# Patient Record
Sex: Male | Born: 1955 | Hispanic: No | Marital: Single | Smoking: Never smoker
Health system: Southern US, Community
[De-identification: ages and names within clinical notes are randomized; demographics above are authoritative.]

## PROBLEM LIST (undated history)

## (undated) DIAGNOSIS — E538 Deficiency of other specified B group vitamins: Secondary | ICD-10-CM

## (undated) DIAGNOSIS — N281 Cyst of kidney, acquired: Secondary | ICD-10-CM

## (undated) DIAGNOSIS — M7020 Olecranon bursitis, unspecified elbow: Secondary | ICD-10-CM

## (undated) DIAGNOSIS — K5900 Constipation, unspecified: Secondary | ICD-10-CM

## (undated) DIAGNOSIS — S14105A Unspecified injury at C5 level of cervical spinal cord, initial encounter: Secondary | ICD-10-CM

## (undated) DIAGNOSIS — L89159 Pressure ulcer of sacral region, unspecified stage: Secondary | ICD-10-CM

## (undated) DIAGNOSIS — N319 Neuromuscular dysfunction of bladder, unspecified: Secondary | ICD-10-CM

## (undated) DIAGNOSIS — I959 Hypotension, unspecified: Secondary | ICD-10-CM

## (undated) DIAGNOSIS — Z993 Dependence on wheelchair: Secondary | ICD-10-CM

## (undated) DIAGNOSIS — N210 Calculus in bladder: Secondary | ICD-10-CM

## (undated) DIAGNOSIS — E559 Vitamin D deficiency, unspecified: Secondary | ICD-10-CM

## (undated) DIAGNOSIS — R972 Elevated prostate specific antigen [PSA]: Secondary | ICD-10-CM

## (undated) DIAGNOSIS — I480 Paroxysmal atrial fibrillation: Secondary | ICD-10-CM

## (undated) DIAGNOSIS — I700 Atherosclerosis of aorta: Secondary | ICD-10-CM

## (undated) DIAGNOSIS — Z933 Colostomy status: Secondary | ICD-10-CM

## (undated) DIAGNOSIS — G8254 Quadriplegia, C5-C7 incomplete: Secondary | ICD-10-CM

## (undated) DIAGNOSIS — E785 Hyperlipidemia, unspecified: Secondary | ICD-10-CM

## (undated) DIAGNOSIS — D649 Anemia, unspecified: Secondary | ICD-10-CM

## (undated) HISTORY — DX: Colostomy status: Z93.3

---
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
## 2012-09-17 DIAGNOSIS — M719 Bursopathy, unspecified: Secondary | ICD-10-CM | POA: Insufficient documentation

## 2013-05-05 ENCOUNTER — Emergency Department: Payer: Self-pay | Admitting: Emergency Medicine

## 2013-05-05 LAB — TROPONIN I: Troponin-I: <0.02 ng/mL

## 2013-05-05 LAB — URINALYSIS, COMPLETE
Bilirubin,UR: NEGATIVE
Glucose,UR: NEGATIVE mg/dL (ref 0–75)
Hyaline Cast: 10
Leukocyte Esterase: NEGATIVE
WBC UR: 4 /HPF (ref 0–5)

## 2013-05-05 LAB — COMPREHENSIVE METABOLIC PANEL
Albumin: 4.1 g/dL (ref 3.4–5.0)
Alkaline Phosphatase: 105 U/L (ref 50–136)
Anion Gap: 7 (ref 7–16)
Calcium, Total: 8.8 mg/dL (ref 8.5–10.1)
Co2: 29 mmol/L (ref 21–32)
Creatinine: 0.71 mg/dL (ref 0.60–1.30)
EGFR (Non-African Amer.): >60
Potassium: 3.2 mmol/L — ABNORMAL LOW (ref 3.5–5.1)
Total Protein: 7.3 g/dL (ref 6.4–8.2)

## 2013-05-05 LAB — CBC WITH DIFFERENTIAL/PLATELET
Eosinophil %: 3.4 %
Lymphocyte %: 12.7 %
MCH: 29 pg (ref 26.0–34.0)
MCV: 87 fL (ref 80–100)
Monocyte #: 0.6 x10 3/mm (ref 0.2–1.0)
Monocyte %: 9.2 %
Neutrophil #: 4.5 10*3/uL (ref 1.4–6.5)
Neutrophil %: 73.6 %
RBC: 4.34 10*6/uL — ABNORMAL LOW (ref 4.40–5.90)
RDW: 13.4 % (ref 11.5–14.5)
WBC: 6.2 10*3/uL (ref 3.8–10.6)

## 2013-05-07 LAB — URINE CULTURE

## 2013-06-08 DIAGNOSIS — R6510 Systemic inflammatory response syndrome (SIRS) of non-infectious origin without acute organ dysfunction: Secondary | ICD-10-CM

## 2013-06-08 HISTORY — DX: Systemic inflammatory response syndrome (sirs) of non-infectious origin without acute organ dysfunction: R65.10

## 2013-06-09 DIAGNOSIS — K5909 Other constipation: Secondary | ICD-10-CM | POA: Insufficient documentation

## 2013-06-09 DIAGNOSIS — R6510 Systemic inflammatory response syndrome (SIRS) of non-infectious origin without acute organ dysfunction: Secondary | ICD-10-CM | POA: Insufficient documentation

## 2013-06-09 DIAGNOSIS — L89159 Pressure ulcer of sacral region, unspecified stage: Secondary | ICD-10-CM | POA: Insufficient documentation

## 2013-06-09 DIAGNOSIS — I959 Hypotension, unspecified: Secondary | ICD-10-CM | POA: Insufficient documentation

## 2013-11-30 ENCOUNTER — Encounter: Payer: Self-pay | Admitting: Pediatrics

## 2013-12-23 ENCOUNTER — Encounter: Payer: Self-pay | Admitting: Pediatrics

## 2019-07-01 ENCOUNTER — Ambulatory Visit: Payer: Self-pay | Admitting: Urology

## 2019-07-03 ENCOUNTER — Ambulatory Visit: Payer: Self-pay | Admitting: Urology

## 2019-07-03 ENCOUNTER — Other Ambulatory Visit: Payer: Self-pay

## 2019-07-03 ENCOUNTER — Ambulatory Visit (INDEPENDENT_AMBULATORY_CARE_PROVIDER_SITE_OTHER): Payer: Medicare (Managed Care) | Admitting: Urology

## 2019-07-03 VITALS — BP 80/45 | HR 41 | Ht 76.0 in

## 2019-07-03 DIAGNOSIS — N319 Neuromuscular dysfunction of bladder, unspecified: Secondary | ICD-10-CM | POA: Diagnosis not present

## 2019-07-03 DIAGNOSIS — R310 Gross hematuria: Secondary | ICD-10-CM

## 2019-07-03 DIAGNOSIS — G8254 Quadriplegia, C5-C7 incomplete: Secondary | ICD-10-CM

## 2019-07-03 LAB — BLADDER SCAN AMB NON-IMAGING: Scan Result: 343

## 2019-07-03 NOTE — Progress Notes (Signed)
07/03/2019 3:56 PM   [name] 02/02/1956 [id number]  Referring provider: Marnee Guarneri, MD [address] [place],  [place] [address]  Chief Complaint  Patient presents with  . Urinary Retention    HPI: [name] is a 64 y.o. 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  He states he may have seen a urologist within a few years after his injury because he had 2 infections.  He denies chronic urinary tract infections.  He voids volitionally and has rare episodes of urinary incontinence.  Denies recurrent urinary tract symptoms.  He was seen by his PCP for an episode of hypotension and dizziness November 2020 and had noted some increased urinary frequency.  Urinalysis was unremarkable.  A PVR by bladder scan was >500 cc.  He subsequently voided 300 cc and a follow-up bladder scan was >250 cc.  He refused a Foley catheter at that time.  His urine culture had insignificant growth.  He states he voided prior to coming to the office today and did not think he would be able to give a sample.  Estimated bladder volume by bladder scan was >300 mL.   PMH: No past medical history on file.  Surgical History: Colostomy  Home Medications:  Allergies as of 07/03/2019   Not on File     Medication List    as of July 03, 2019  3:56 PM   You have not been prescribed any medications.     Allergies: Not on File  Family History: No family history on file.  Social History:  has no history on file for tobacco, alcohol, and drug.  ROS: UROLOGY Frequent Urination?: No Hard to postpone urination?: No Burning/pain with urination?: No Get up at night to urinate?: No Leakage of urine?: No Urine stream starts and stops?: Yes Trouble starting stream?: No Do you have to strain to urinate?: No Blood in urine?: No Urinary tract infection?: No Sexually transmitted disease?: No Injury to kidneys or bladder?: No Painful intercourse?:  No Weak stream?: No Erection problems?: No Penile pain?: No  Gastrointestinal Nausea?: No Vomiting?: No Indigestion/heartburn?: No Diarrhea?: No Constipation?: No  Constitutional Fever: No Night sweats?: No Weight loss?: No Fatigue?: No  Skin Skin rash/lesions?: No Itching?: No  Eyes Blurred vision?: No Double vision?: No  Ears/Nose/Throat Sore throat?: No Sinus problems?: No  Hematologic/Lymphatic Swollen glands?: No Easy bruising?: No  Cardiovascular Leg swelling?: No Chest pain?: No  Respiratory Cough?: No Shortness of breath?: No  Endocrine Excessive thirst?: No  Musculoskeletal Back pain?: No Joint pain?: No  Neurological Headaches?: No Dizziness?: No  Psychologic Depression?: No Anxiety?: No  Physical Exam: BP (!) 80/45 (BP Location: Left Arm, Patient Position: Sitting, Cuff Size: Large)   Pulse (!) 41   Ht 6\' 4"  (1.93 m)   Constitutional:  Alert and oriented, No acute distress. HEENT: [place] AT, moist mucus membranes.  Trachea midline, no masses. Cardiovascular: No clubbing, cyanosis, or edema. Respiratory: Normal respiratory effort, no increased work of breathing. GI: Abdomen is soft, nontender, nondistended, no abdominal masses GU: No CVA tenderness Skin: No rashes, bruises or suspicious lesions. Psychiatric: Normal mood and affect.   Assessment & Plan:   64 y.o. male with C7 spinal cord injury.  I suspect his mild to moderate residuals are chronic.  Recommend scheduling a renal ultrasound to assess upper tracts.  Will discuss urodynamic study after renal ultrasound.  Abbie Sons, [place]  [address] [place], [place] [address] [phone]

## 2019-07-06 ENCOUNTER — Telehealth: Payer: Self-pay | Admitting: Urology

## 2019-07-06 ENCOUNTER — Encounter: Payer: Self-pay | Admitting: Urology

## 2019-07-06 DIAGNOSIS — G8254 Quadriplegia, C5-C7 incomplete: Secondary | ICD-10-CM | POA: Insufficient documentation

## 2019-07-06 DIAGNOSIS — N319 Neuromuscular dysfunction of bladder, unspecified: Secondary | ICD-10-CM | POA: Insufficient documentation

## 2019-07-06 NOTE — Telephone Encounter (Signed)
Patient needs a renal ultrasound scheduled.  He is part of the [facility] program at [facility] and I think they have to approve and schedule his appointments.  Contact info is scanned in media.  Thanks

## 2019-07-07 NOTE — Telephone Encounter (Signed)
Thanks, he will not necessarily need office follow-up.  It will depend on the ultrasound results.

## 2019-07-07 NOTE — Telephone Encounter (Signed)
Contacted [facility] and they will get the RUS scheduled and call back to schedule the follow up

## 2019-07-13 ENCOUNTER — Other Ambulatory Visit: Payer: Self-pay | Admitting: Family Medicine

## 2019-07-13 DIAGNOSIS — R339 Retention of urine, unspecified: Secondary | ICD-10-CM

## 2019-07-16 ENCOUNTER — Other Ambulatory Visit: Payer: Self-pay | Admitting: Family Medicine

## 2019-07-16 DIAGNOSIS — R1032 Left lower quadrant pain: Secondary | ICD-10-CM

## 2019-07-17 ENCOUNTER — Ambulatory Visit
Admission: RE | Admit: 2019-07-17 | Discharge: 2019-07-17 | Disposition: A | Discharge status: Home / Self Care | Payer: Medicare (Managed Care) | Source: Ambulatory Visit | Attending: Family Medicine | Admitting: Family Medicine

## 2019-07-17 ENCOUNTER — Other Ambulatory Visit: Payer: Self-pay

## 2019-07-17 DIAGNOSIS — R1032 Left lower quadrant pain: Secondary | ICD-10-CM | POA: Insufficient documentation

## 2019-07-17 DIAGNOSIS — R339 Retention of urine, unspecified: Secondary | ICD-10-CM | POA: Diagnosis not present

## 2020-01-28 ENCOUNTER — Ambulatory Visit: Admit: 2020-01-28 | Payer: Medicare (Managed Care) | Admitting: Internal Medicine

## 2020-01-28 SURGERY — COLONOSCOPY WITH PROPOFOL
Anesthesia: General

## 2020-02-23 ENCOUNTER — Other Ambulatory Visit: Payer: Self-pay

## 2020-02-23 ENCOUNTER — Other Ambulatory Visit: Payer: Self-pay | Admitting: Family Medicine

## 2020-02-23 ENCOUNTER — Ambulatory Visit (INDEPENDENT_AMBULATORY_CARE_PROVIDER_SITE_OTHER): Payer: Medicare (Managed Care) | Admitting: Physician Assistant

## 2020-02-23 VITALS — BP 71/47 | HR 86 | Ht 76.0 in | Wt 160.0 lb

## 2020-02-23 DIAGNOSIS — N319 Neuromuscular dysfunction of bladder, unspecified: Secondary | ICD-10-CM | POA: Diagnosis not present

## 2020-02-23 DIAGNOSIS — G43C0 Periodic headache syndromes in child or adult, not intractable: Secondary | ICD-10-CM

## 2020-02-23 LAB — BLADDER SCAN AMB NON-IMAGING

## 2020-02-23 NOTE — Progress Notes (Signed)
02/23/2020 3:00 PM   [name] [birth year] [id number]  CC: Chief Complaint  Patient presents with  . Urinary Retention    HPI: [name] is a 64 y.o. male with incomplete quadriplegia due to a C7 spinal cord injury who was seen in clinic earlier this year by [name] for evaluation of urinary retention who presents today for the same. He has been noted to have chronically elevated residuals >377mL; renal ultrasound dated 07/17/2019 with no hydronephrosis.  Today he reports being prompted to come to the office by his PCP, who expressed concern about possible urinary retention. Patient reports pain in his head and neck with urination over the past 3-4 days, during which time he has been constipated. He started Senna for management of his constipation and his pain has begun to resolve now that he has started having bowel movements. He believes the pain is secondary to his constipation and would like to defer urologic intervention pending resolution of this.  He does report increased hesitancy and intermittency during this period of constipation which is not typical for him. He wonders if an enlarged prostate may be contributory to poorly emptying his bladder.  PVR 365mL.  PMH: No past medical history on file.  Surgical History: No past surgical history on file.  Home Medications:  Allergies as of 02/23/2020   Not on File     Medication List       Accurate as of February 23, 2020  3:00 PM. If you have any questions, ask your nurse or doctor.        Cholecalciferol 25 MCG (1000 UT) tablet Take by mouth.   diltiazem 60 MG tablet Commonly known as: CARDIZEM Take by mouth.   docusate sodium 100 MG capsule Commonly known as: COLACE Take 100 mg by mouth daily as needed.   gabapentin 300 MG capsule Commonly known as: NEURONTIN Take 300 mg by mouth at bedtime.   midodrine 2.5 MG tablet Commonly known as: PROAMATINE Take 2.5 mg by mouth daily.   potassium  chloride 10 MEQ tablet Commonly known as: KLOR-CON Take by mouth.       Allergies:  Not on File  Family History: No family history on file.  Social History:   has no history on file for tobacco use, alcohol use, and drug use.  Physical Exam: BP (!) 71/47 (BP Location: Left Arm, Patient Position: Sitting, Cuff Size: Normal)   Pulse 86   Ht 6\' 4"  (1.93 m)   Wt 160 lb (72.6 kg)   BMI 19.48 kg/m   Constitutional:  Alert and oriented, no acute distress, nontoxic appearing HEENT: [place], AT Cardiovascular: No clubbing, cyanosis, or edema Respiratory: Normal respiratory effort, no increased work of breathing Skin: No rashes, bruises or suspicious lesions Psychiatric: Normal mood and affect  Laboratory Data: Results for orders placed or performed in visit on 02/23/20  Bladder Scan (Post Void Residual) in office  Result Value Ref Range   Scan Result 347mL    Assessment & Plan:   1. Neurogenic bladder PVR stable compared to prior with renal US earlier this year reassuring for upper tract involvement. No intervention indicated at this time and patient agrees with this. I recommend annual follow-up with renal ultrasound to evaluate him for worsening incomplete emptying and explained that he may require indwelling Foley versus CIC if he develops worsened chronic retention, upper tract involvement, or recurrent UTI.  I suspect his pain, hesitancy, and intermittency may be associated with active constipation. Counseled him  to contact the office for further evaluation of these with IPSS and DRE if they do not resolve with resolution of constipation. He expressed understanding. - Bladder Scan (Post Void Residual) in office  Return in about 1 year (around 02/22/2021) for Incomplete emptying f/u with PVR + RUS prior.  Debroah Loop, PA-C  [facility] [address], [place], [place] [address] [phone]

## 2020-03-10 ENCOUNTER — Ambulatory Visit
Admission: RE | Admit: 2020-03-10 | Discharge: 2020-03-10 | Disposition: A | Discharge status: Home / Self Care | Payer: Medicare (Managed Care) | Source: Ambulatory Visit | Attending: Family Medicine | Admitting: Family Medicine

## 2020-03-10 ENCOUNTER — Other Ambulatory Visit: Payer: Self-pay

## 2020-03-10 DIAGNOSIS — G43C0 Periodic headache syndromes in child or adult, not intractable: Secondary | ICD-10-CM | POA: Insufficient documentation

## 2021-02-21 ENCOUNTER — Other Ambulatory Visit: Payer: Self-pay

## 2021-02-21 DIAGNOSIS — N319 Neuromuscular dysfunction of bladder, unspecified: Secondary | ICD-10-CM

## 2021-02-22 ENCOUNTER — Ambulatory Visit: Payer: Self-pay | Admitting: Physician Assistant

## 2021-03-01 ENCOUNTER — Other Ambulatory Visit: Payer: Self-pay | Admitting: Family Medicine

## 2021-03-01 DIAGNOSIS — N319 Neuromuscular dysfunction of bladder, unspecified: Secondary | ICD-10-CM

## 2021-03-09 ENCOUNTER — Ambulatory Visit: Payer: Medicare (Managed Care)

## 2021-03-23 ENCOUNTER — Ambulatory Visit
Admission: RE | Admit: 2021-03-23 | Discharge: 2021-03-23 | Disposition: A | Discharge status: Home / Self Care | Payer: Medicare (Managed Care) | Source: Ambulatory Visit | Attending: Family Medicine | Admitting: Family Medicine

## 2021-03-23 ENCOUNTER — Other Ambulatory Visit: Payer: Self-pay

## 2021-03-23 DIAGNOSIS — N319 Neuromuscular dysfunction of bladder, unspecified: Secondary | ICD-10-CM | POA: Insufficient documentation

## 2021-05-08 ENCOUNTER — Encounter: Payer: Self-pay | Admitting: *Deleted

## 2021-06-01 ENCOUNTER — Ambulatory Visit: Payer: Medicare (Managed Care) | Admitting: Physician Assistant

## 2021-06-29 ENCOUNTER — Ambulatory Visit: Payer: Medicare (Managed Care) | Admitting: Physician Assistant

## 2021-07-19 NOTE — Progress Notes (Signed)
07/20/2021 10:37 AM   [name] 01/17/1956 [id number]  Referring provider: [facility] [address] [place],  [place] [address]  Chief Complaint  Patient presents with   Urinary Retention   Urological history: 1.  Neurogenic bladder -Secondary to C7 spinal cord injury resulting in incomplete quadriplegia -RUS 03/2021 -no hydronephrosis, stones or masses appreciated -PVR's 323 mL   HPI: [name] is a 66 y.o. male who presents today for a yearly visit.    He has been having a weak urinary stream and post void dribbling urinary leakage.  Patient denies any modifying or aggravating factors.  Patient denies any gross hematuria, dysuria or suprapubic/flank pain.  Patient denies any fevers, chills, nausea or vomiting.    PVR 260 mL     PMH: No past medical history on file.  Surgical History: No past surgical history on file.  Home Medications:  Allergies as of 07/20/2021   Not on File      Medication List        Accurate as of July 20, 2021 10:37 AM. If you have any questions, ask your nurse or doctor.          Cholecalciferol 25 MCG (1000 UT) tablet Take by mouth.   diltiazem 60 MG tablet Commonly known as: CARDIZEM Take by mouth.   docusate sodium 100 MG capsule Commonly known as: COLACE Take 100 mg by mouth daily as needed.   gabapentin 300 MG capsule Commonly known as: NEURONTIN Take 300 mg by mouth at bedtime.   midodrine 2.5 MG tablet Commonly known as: PROAMATINE Take 2.5 mg by mouth daily.   potassium chloride 10 MEQ tablet Commonly known as: KLOR-CON Take by mouth.        Allergies: Not on File  Family History: No family history on file.  Social History:  reports that he has never smoked. He has never used smokeless tobacco. He reports that he does not currently use alcohol. No history on file for drug use.  ROS: Pertinent ROS in HPI  Physical Exam: BP (!) 89/63    Pulse 88    Ht 6\' 4"  (1.93  m)    BMI 19.48 kg/m   Constitutional:  Well nourished. Alert and oriented, No acute distress. HEENT: [place] AT, mask in place.  Trachea midline Cardiovascular: No clubbing, cyanosis, or edema. Respiratory: Normal respiratory effort, no increased work of breathing. Neurologic: Grossly intact, no focal deficits, moving 2 extremities.  In wheel chair. Psychiatric: Normal mood and affect.  Laboratory Data: N/A   I have reviewed the labs.   Pertinent Imaging:  07/20/21 10:12  Scan Result 269   Assessment & Plan:    1. Neurogenic bladder -PVR <300 cc -We discussed that the postvoid dribbling may be due to his neurogenic bladder or BPH and 1 way to help delineate this is to undergo urodynamic studies -I explained to the patient that urodynamics is a study that assesses how the bladder and urethra are performing their job of storing and releasing urine.  Urodynamic tests can help explain symptoms such as: incontinence, frequent urination, urgency, hesitancy, dysuria, urinary retention and/or recurrent UTI's.  To perform the test, special catheter is placed into the urethra, a rectal catheter is placed and electrodes are placed around the perineum.  The bladder is filled with water and the patient will be asked to void, it they can.  There may be a video component involved at some center, where the bladder activity is monitored  by a video  -He is interested in having this procedure, but since he belongs to the [facility] program he will need to discuss it with his physician prior to scheduling -He would also like to start tamsulosin for this dribbling, but again he will speak with his physician at [facility] program to see if he can start this medication though it may not be effective if his symptoms are due to a neurogenic bladder versus BPH and he understands this -He will also need a repeat renal ultrasound in October for surveillance for potential hydronephrosis as he has high residuals, and he will let his  physician know at [facility] program  Return in about 1 year (around 07/20/2022) for RUS and PVR .  These notes generated with voice recognition software. I apologize for typographical errors.  Zara Council, PA-C  [facility] [address]  [place], [place] [address] [phone]

## 2021-07-20 ENCOUNTER — Other Ambulatory Visit: Payer: Self-pay

## 2021-07-20 ENCOUNTER — Encounter: Payer: Self-pay | Admitting: Urology

## 2021-07-20 ENCOUNTER — Ambulatory Visit (INDEPENDENT_AMBULATORY_CARE_PROVIDER_SITE_OTHER): Payer: Medicare (Managed Care) | Admitting: Urology

## 2021-07-20 VITALS — BP 89/63 | HR 88 | Ht 76.0 in

## 2021-07-20 DIAGNOSIS — N319 Neuromuscular dysfunction of bladder, unspecified: Secondary | ICD-10-CM

## 2021-07-20 LAB — BLADDER SCAN AMB NON-IMAGING: Scan Result: 269

## 2021-07-20 NOTE — Patient Instructions (Signed)
Mr. Tortorella was experiencing dribbling at the end of voiding, so we discussed starting tamsulosin 0.4 mg daily to see if we can help with the postvoid dribbling issue.  Although this may be due to his neurogenic bladder and not his prostate.  He also will need another ultrasound in October 2023 as he has high residuals, we need to make sure that the urine does not back up into the kidneys.  We also discussed undergoing urodynamics for further studies on his bladder function and he is interested in pursuing these.

## 2021-07-27 ENCOUNTER — Other Ambulatory Visit: Payer: Self-pay | Admitting: Urology

## 2021-07-27 ENCOUNTER — Telehealth: Payer: Self-pay

## 2021-07-27 DIAGNOSIS — N319 Neuromuscular dysfunction of bladder, unspecified: Secondary | ICD-10-CM

## 2021-07-27 NOTE — Telephone Encounter (Signed)
[name] from [facility] program left a message on triage line regarding [name]. she had some questions regarding UDS and concerns about patient taking flomax due to his low blood pressure. Dr.Mouw cell [phone]

## 2021-08-01 ENCOUNTER — Other Ambulatory Visit: Payer: Self-pay | Admitting: Family Medicine

## 2021-08-01 DIAGNOSIS — R9389 Abnormal findings on diagnostic imaging of other specified body structures: Secondary | ICD-10-CM

## 2021-08-01 DIAGNOSIS — G8254 Quadriplegia, C5-C7 incomplete: Secondary | ICD-10-CM

## 2021-08-01 DIAGNOSIS — N319 Neuromuscular dysfunction of bladder, unspecified: Secondary | ICD-10-CM

## 2021-08-01 DIAGNOSIS — R339 Retention of urine, unspecified: Secondary | ICD-10-CM

## 2021-09-01 ENCOUNTER — Other Ambulatory Visit: Payer: Self-pay | Admitting: Urology

## 2021-09-04 ENCOUNTER — Other Ambulatory Visit: Payer: Self-pay | Admitting: Urology

## 2021-09-12 NOTE — Progress Notes (Incomplete)
09/12/21 ?12:22 PM  ? ?[name] ?1955/11/02 ?[id number] ? ?Referring provider:  ?[facility] ?[address] ?[place],  [place] [address] ?No chief complaint on file. ? ? ?Urological history  ?1.  Neurogenic bladder ?-Secondary to C7 spinal cord injury resulting in incomplete quadriplegia ?-RUS 03/2021 -no hydronephrosis, stones or masses appreciated ?-PVR's *** ? ? ?HPI: ?[name] is a 66 y.o.male who presents today for discussion of UDS results.  ? ?He underwent UDS on 08/30/2021 that revealed he held a max capacity of approx. 450 mls. His 1st sensation was felt as 235 mls. There was positive instability. He has a couple unstable contractions. He felt a strong urge intermittently and leaked severely off these contractions. He did not generate a voluntary contraction and void.  ? ? ?PMH: ?No past medical history on file. ? ?Surgical History: ?No past surgical history on file. ? ?Home Medications:  ?Allergies as of 09/13/2021   ?Not on File ?  ? ?  ?Medication List  ?  ? ?  ? Accurate as of September 12, 2021 12:22 PM. If you have any questions, ask your nurse or doctor.  ?  ?  ? ?  ? ?Cholecalciferol 25 MCG (1000 UT) tablet ?Take by mouth. ?  ?diltiazem 60 MG tablet ?Commonly known as: CARDIZEM ?Take by mouth. ?  ?docusate sodium 100 MG capsule ?Commonly known as: COLACE ?Take 100 mg by mouth daily as needed. ?  ?gabapentin 300 MG capsule ?Commonly known as: NEURONTIN ?Take 300 mg by mouth at bedtime. ?  ?midodrine 2.5 MG tablet ?Commonly known as: PROAMATINE ?Take 2.5 mg by mouth daily. ?  ?potassium chloride 10 MEQ tablet ?Commonly known as: KLOR-CON ?Take by mouth. ?  ? ?  ? ? ?Allergies:  ?Not on File ? ?Family History: ?No family history on file. ? ?Social History:  reports that he has never smoked. He has never used smokeless tobacco. He reports that he does not currently use alcohol. No history on file for drug use. ? ? ?Physical Exam: ?There were no vitals taken for this visit.   ?Constitutional:  Alert and oriented, No acute distress. ?HEENT: [place] AT, moist mucus membranes.  Trachea midline, no masses. ?Cardiovascular: No clubbing, cyanosis, or edema. ?Respiratory: Normal respiratory effort, no increased work of breathing. ?Skin: No rashes, bruises or suspicious lesions. ?Neurologic: Grossly intact, no focal deficits, moving all 4 extremities. ?Psychiatric: Normal mood and affect. ? ? ?Laboratory Data: ?Lab Results  ?Component Value Date  ? CREATININE 0.71 05/05/2013  ? ? ?Urinalysis ? ? ?Pertinent Imaging: ? ? ?Assessment & Plan:   ? ? ?No follow-ups on file. ? ?[facility] ?[address] ?[place], [place] [address] ?[phone] ? ?I,Kailey Littlejohn,acting as a Education administrator for [employer], PA-C.,have documented all relevant documentation on the behalf of [name], PA-C,as directed by  [facility], PA-C while in the presence of SHANNON MCGOWAN, PA-C. ?

## 2021-09-13 ENCOUNTER — Ambulatory Visit: Payer: Medicare (Managed Care) | Admitting: Urology

## 2021-09-28 ENCOUNTER — Telehealth: Payer: Self-pay | Admitting: Urology

## 2021-09-28 NOTE — Telephone Encounter (Signed)
Leward Quan, MD with the [facility] program called the office today to provide information about this mutual quadriplegic patient.   ? ?Patient was sent to [facility] for a urodynamics study.  Patient experienced a UTI post study - [name] treated patient with fosfomycin x 2. ? ?Patient shared with [name] that the staff at [facility] advised him that his prostate was enlarged and they had trouble passing the catheter during the urodynamics study. ? ?[name] went back to review lab history for the patient and found a PSA result of 16 on 04/20/2021 (will fax report for our records). ? ?[name] wanted to share all of this information with you prior to his appointment on 4/14.  She would like our office to address his prostate issues as well as his neurogenic bladder. ?

## 2021-09-28 NOTE — Progress Notes (Signed)
10/07/21 ?7:06 PM  ? ?[name] ?1955-11-17 ?[id number] ? ?Referring provider:  ?[facility] ?[address] ?[place],  [place] [address] ? ?Chief Complaint  ?Patient presents with  ? Results  ? ?Urological history  ?1.  Neurogenic bladder ?-Secondary to C7 spinal cord injury resulting in incomplete quadriplegia ?-RUS 03/2021 -no hydronephrosis, stones or masses appreciated ? ?2. Left renal cysts ?-RUS 03/2021 - Stable left renal cysts ? ?3. Elevated PSA ?-PSA 16.6 in 04/2021 ? ?HPI: ?[name] is a 66 y.o.male who presents today for UDS results.  ? ?He reports that he has experienced urinary frequency. He feels that he may have a bladder infection.  ? ?He underwent UDS on 08/31/2021. It revealed that he held a max capacity of approx. 450 mls. His 1st sensation was felt at 235 mls and there was positive instability. He had a couple unstable contractions. He felt a strong urge intermittently and leaked severely off these contractions. He did not generate voluntary contraction and void. He diagnosis was incomplete bladder emptying and urge incontinence.  ? ?PMH: ?No past medical history on file. ? ?Surgical History: ?No past surgical history on file. ? ?Home Medications:  ?Allergies as of 09/29/2021   ?Not on File ?  ? ?  ?Medication List  ?  ? ?  ? Accurate as of September 29, 2021 11:59 PM. If you have any questions, ask your nurse or doctor.  ?  ?  ? ?  ? ?Cholecalciferol 25 MCG (1000 UT) tablet ?Take by mouth. ?  ?diltiazem 60 MG tablet ?Commonly known as: CARDIZEM ?Take by mouth. ?  ?docusate sodium 100 MG capsule ?Commonly known as: COLACE ?Take 100 mg by mouth daily as needed. ?  ?gabapentin 300 MG capsule ?Commonly known as: NEURONTIN ?Take 300 mg by mouth at bedtime. ?  ?midodrine 2.5 MG tablet ?Commonly known as: PROAMATINE ?Take 2.5 mg by mouth daily. ?  ?potassium chloride 10 MEQ tablet ?Commonly known as: KLOR-CON ?Take by mouth. ?  ? ?  ? ? ?Allergies:  ?Not on File ? ?Family  History: ?No family history on file. ? ?Social History:  reports that he has never smoked. He has never used smokeless tobacco. He reports that he does not currently use alcohol. No history on file for drug use. ? ? ?Physical Exam: ?BP (!) 68/48   Pulse 81   ?Constitutional:  Alert and oriented, No acute distress. ?HEENT: [place] AT, moist mucus membranes.  Trachea midline, no masses. ?Cardiovascular: No clubbing, cyanosis, or edema. ?Respiratory: Normal respiratory effort, no increased work of breathing. ?Skin: No rashes, bruises or suspicious lesions. ?Neurologic: Grossly intact, no focal deficits, nonambulatory ?Psychiatric: Normal mood and affect. ? ?Laboratory Data: ?Results for orders placed or performed in visit on 09/29/21  ?CULTURE, URINE COMPREHENSIVE  ? Specimen: Urine  ? UR  ?Result Value Ref Range  ? Urine Culture, Comprehensive Final report (A)   ? Organism ID, Bacteria Comment (A)   ?Microscopic Examination  ? Urine  ?Result Value Ref Range  ? WBC, UA 0-5 0 - 5 /hpf  ? RBC 0-2 0 - 2 /hpf  ? Epithelial Cells (non renal) 0-10 0 - 10 /hpf  ? Renal Epithel, UA 0-10 (A) None seen /hpf  ? Bacteria, UA Many (A) None seen/Few  ?PSA  ?Result Value Ref Range  ? Prostate Specific Ag, Serum 20.2 (H) 0.0 - 4.0 ng/mL  ?Urinalysis, Complete  ?Result Value Ref Range  ? Specific Gravity, UA 1.020 1.005 - 1.030  ?  pH, UA 7.0 5.0 - 7.5  ? Color, UA Yellow Yellow  ? Appearance Ur Clear Clear  ? Leukocytes,UA Negative Negative  ? Protein,UA Negative Negative/Trace  ? Glucose, UA Negative Negative  ? Ketones, UA Negative Negative  ? RBC, UA Negative Negative  ? Bilirubin, UA Negative Negative  ? Urobilinogen, Ur 0.2 0.2 - 1.0 mg/dL  ? Nitrite, UA Negative Negative  ? Microscopic Examination See below:   ?I have reviewed the labs.  ? ? ?Pertinent imaging: ?N/A ? ? ?In and Out Catheterization ?Patient is present today for a I & O catheterization due to neurogenic bladder. Patient was cleaned and prepped in a sterile fashion with  betadine . A 14 FR cath was inserted no complications were noted , 150 ml of urine return was noted, urine was yellow in color. A clean urine sample was collected for UA and urine culture. Bladder was drained  And catheter was removed with out difficulty.   ? ?Performed by: Zara Council, PA-C  ? ?Assessment & Plan:   ? ?1. Neurogenic bladder ?- We discussed urodynamics results urinary incontinence and incomplete bladder emptying  ?- it demonstrates there may be a component of obstruction causing his urinary symptoms, but with a history of elevated PSA, we will likely need to pursue ruling out prostate cancer before pursuing more evaluation and management of his urinary symptoms ?-PSA is pending at this time ? ?2. Suspected UTI  ?- He was cathed today for a UA; pending  ?- UA sent for culture.  ? ?3. Elevated PSA ?-PSA of 16 in November ?-PSA was repeated today, if urine culture returns positive for infection and PSA remains elevated, we will treat with antibiotic and repeat PSA ?-If urine culture is negative and repeat PSA returns elevated, we will pursue prostate MRI for further evaluation ? ?Return for pending urine culture/PSA results . ? ?[facility] ?[address] ?[place], [place] [address] ?[phone] ? ?Larae Grooms, acting as a Education administrator for [employer], PA-C.,have documented all relevant documentation on the behalf of Brealynn Contino, PA-C,as directed by  [facility], PA-C while in the presence of [place], PA-C. ? ?I have reviewed the above documentation for accuracy and completeness, and I agree with the above.   ? ?Zara Council, PA-C  ?

## 2021-09-29 ENCOUNTER — Ambulatory Visit (INDEPENDENT_AMBULATORY_CARE_PROVIDER_SITE_OTHER): Payer: Medicare (Managed Care) | Admitting: Urology

## 2021-09-29 VITALS — BP 68/48 | HR 81

## 2021-09-29 DIAGNOSIS — N319 Neuromuscular dysfunction of bladder, unspecified: Secondary | ICD-10-CM

## 2021-09-29 DIAGNOSIS — N401 Enlarged prostate with lower urinary tract symptoms: Secondary | ICD-10-CM

## 2021-09-29 DIAGNOSIS — R972 Elevated prostate specific antigen [PSA]: Secondary | ICD-10-CM

## 2021-09-29 DIAGNOSIS — R3989 Other symptoms and signs involving the genitourinary system: Secondary | ICD-10-CM

## 2021-09-29 DIAGNOSIS — R300 Dysuria: Secondary | ICD-10-CM

## 2021-09-29 LAB — MICROSCOPIC EXAMINATION

## 2021-09-29 LAB — URINALYSIS, COMPLETE
Bilirubin, UA: NEGATIVE
Glucose, UA: NEGATIVE
Ketones, UA: NEGATIVE
Leukocytes,UA: NEGATIVE
Nitrite, UA: NEGATIVE
Protein,UA: NEGATIVE
RBC, UA: NEGATIVE
Specific Gravity, UA: 1.02 (ref 1.005–1.030)
Urobilinogen, Ur: 0.2 mg/dL (ref 0.2–1.0)
pH, UA: 7 (ref 5.0–7.5)

## 2021-09-30 LAB — PSA: Prostate Specific Ag, Serum: 20.2 ng/mL — ABNORMAL HIGH (ref 0.0–4.0)

## 2021-10-04 LAB — CULTURE, URINE COMPREHENSIVE

## 2021-10-07 ENCOUNTER — Encounter: Payer: Self-pay | Admitting: Urology

## 2021-10-09 ENCOUNTER — Other Ambulatory Visit: Payer: Self-pay | Admitting: *Deleted

## 2021-10-09 MED ORDER — DOXYCYCLINE HYCLATE 100 MG PO CAPS: 100.0000 mg | ORAL_CAPSULE | Freq: Two times a day (BID) | ORAL | 0 refills | Status: AC

## 2021-10-09 NOTE — Progress Notes (Signed)
DOXY

## 2021-10-10 ENCOUNTER — Telehealth: Payer: Self-pay | Admitting: Family Medicine

## 2021-10-10 NOTE — Telephone Encounter (Signed)
[name] from [facility] called and requested the office note from 09/29/2021 faxed to [phone]. She was questioning why Doxycycline was prescribed. I informed her that he had a positive urine culture and his PSA was elevated. The medication is for a UTI and he is to have a repeat PSA in 1 month per [facility]. Appointment for lab has been scheduled.  ?

## 2021-11-02 ENCOUNTER — Other Ambulatory Visit: Payer: Self-pay | Admitting: *Deleted

## 2021-11-02 ENCOUNTER — Other Ambulatory Visit: Payer: Medicare (Managed Care)

## 2021-11-02 DIAGNOSIS — R972 Elevated prostate specific antigen [PSA]: Secondary | ICD-10-CM

## 2021-11-03 LAB — PSA: Prostate Specific Ag, Serum: 20 ng/mL — ABNORMAL HIGH (ref 0.0–4.0)

## 2021-11-06 ENCOUNTER — Other Ambulatory Visit: Payer: Self-pay | Admitting: Urology

## 2021-11-06 DIAGNOSIS — R972 Elevated prostate specific antigen [PSA]: Secondary | ICD-10-CM

## 2021-11-06 DIAGNOSIS — R9389 Abnormal findings on diagnostic imaging of other specified body structures: Secondary | ICD-10-CM

## 2021-11-06 NOTE — Progress Notes (Addendum)
I have placed an order for a prostate mri for him.  [facility] will send over the approval.

## 2021-11-21 ENCOUNTER — Ambulatory Visit
Admission: RE | Admit: 2021-11-21 | Discharge: 2021-11-21 | Disposition: A | Discharge status: Home / Self Care | Payer: Medicare (Managed Care) | Source: Ambulatory Visit | Attending: Urology | Admitting: Urology

## 2021-11-21 DIAGNOSIS — R972 Elevated prostate specific antigen [PSA]: Secondary | ICD-10-CM | POA: Insufficient documentation

## 2021-11-21 DIAGNOSIS — R9389 Abnormal findings on diagnostic imaging of other specified body structures: Secondary | ICD-10-CM | POA: Diagnosis present

## 2021-11-21 MED ORDER — GADOBUTROL 1 MMOL/ML IV SOLN
7.0000 mL | Freq: Once | INTRAVENOUS | Status: AC | PRN
  Administered 2021-11-21: 7 mL via INTRAVENOUS

## 2021-11-27 ENCOUNTER — Encounter: Payer: Self-pay | Admitting: Urology

## 2021-12-01 ENCOUNTER — Telehealth: Payer: Self-pay

## 2021-12-01 NOTE — Telephone Encounter (Signed)
-----   Message from Nori Riis, PA-C sent at 12/01/2021  9:38 AM EDT ----- Would you call [name] and let him know that we need to see him in the office, so that we can get a catheterized specimen to send for urinalysis and urine culture prior to undergoing the prostate biopsy.  I would also like to speak with him more in detail regarding the prostate biopsy as to what to expect and the risks involved.

## 2021-12-01 NOTE — Telephone Encounter (Signed)
Attempted to reach pt at all numbers listed in the chart. Will attempt again later.

## 2021-12-03 ENCOUNTER — Other Ambulatory Visit: Payer: Self-pay

## 2021-12-03 DIAGNOSIS — R102 Pelvic and perineal pain: Secondary | ICD-10-CM | POA: Diagnosis not present

## 2021-12-03 DIAGNOSIS — L89152 Pressure ulcer of sacral region, stage 2: Secondary | ICD-10-CM | POA: Diagnosis not present

## 2021-12-03 LAB — COMPREHENSIVE METABOLIC PANEL
ALT: 17 U/L (ref 0–44)
AST: 17 U/L (ref 15–41)
Albumin: 3.6 g/dL (ref 3.5–5.0)
Alkaline Phosphatase: 95 U/L (ref 38–126)
Anion gap: 7 (ref 5–15)
BUN: 24 mg/dL — ABNORMAL HIGH (ref 8–23)
CO2: 26 mmol/L (ref 22–32)
Calcium: 9 mg/dL (ref 8.9–10.3)
Chloride: 109 mmol/L (ref 98–111)
Creatinine, Ser: 0.94 mg/dL (ref 0.61–1.24)
GFR, Estimated: >60 mL/min (ref >60)
Glucose, Bld: 117 mg/dL — ABNORMAL HIGH (ref 70–99)
Potassium: 3.3 mmol/L — ABNORMAL LOW (ref 3.5–5.1)
Sodium: 142 mmol/L (ref 135–145)
Total Bilirubin: 0.8 mg/dL (ref 0.3–1.2)
Total Protein: 7 g/dL (ref 6.5–8.1)

## 2021-12-03 LAB — CBC WITH DIFFERENTIAL/PLATELET
Abs Immature Granulocytes: 0.01 10*3/uL (ref 0.00–0.07)
Basophils Absolute: 0 10*3/uL (ref 0.0–0.1)
Basophils Relative: 0 %
Eosinophils Absolute: 0.1 10*3/uL (ref 0.0–0.5)
Eosinophils Relative: 1 %
HCT: 44.4 % (ref 39.0–52.0)
Hemoglobin: 14.1 g/dL (ref 13.0–17.0)
Immature Granulocytes: 0 %
Lymphocytes Relative: 16 %
Lymphs Abs: 1.1 10*3/uL (ref 0.7–4.0)
MCH: 28.7 pg (ref 26.0–34.0)
MCHC: 31.8 g/dL (ref 30.0–36.0)
MCV: 90.4 fL (ref 80.0–100.0)
Monocytes Absolute: 0.6 10*3/uL (ref 0.1–1.0)
Monocytes Relative: 8 %
Neutro Abs: 5.3 10*3/uL (ref 1.7–7.7)
Neutrophils Relative %: 75 %
Platelets: 180 10*3/uL (ref 150–400)
RBC: 4.91 MIL/uL (ref 4.22–5.81)
RDW: 12.7 % (ref 11.5–15.5)
WBC: 7.2 10*3/uL (ref 4.0–10.5)
nRBC: 0 % (ref 0.0–0.2)

## 2021-12-03 LAB — LACTIC ACID, PLASMA: Lactic Acid, Venous: 1.1 mmol/L (ref 0.5–1.9)

## 2021-12-03 NOTE — ED Triage Notes (Signed)
Pt states is bedbound and has a wound on his buttock that has been intermittently bleeding and he believes may be infected. Pt states has been present for several months. Pt appears in no acute distress. States may have had fever at home, complains of some chills.

## 2021-12-03 NOTE — ED Triage Notes (Signed)
FIRST NURSE NOTE:  Pt arrived via [facility] from home, pressure ulcer to buttock, outpt treatment, fever, chills, p-110-140 hx of afib,  153/106

## 2021-12-04 ENCOUNTER — Emergency Department: Payer: Medicare (Managed Care)

## 2021-12-04 ENCOUNTER — Emergency Department
Admission: EM | Admit: 2021-12-04 | Discharge: 2021-12-04 | Disposition: A | Discharge status: Home / Self Care | Payer: Medicare (Managed Care) | Attending: Emergency Medicine | Admitting: Emergency Medicine

## 2021-12-04 DIAGNOSIS — L89152 Pressure ulcer of sacral region, stage 2: Secondary | ICD-10-CM

## 2021-12-04 MED ORDER — IOHEXOL 300 MG/ML  SOLN
100.0000 mL | Freq: Once | INTRAMUSCULAR | Status: AC | PRN
  Administered 2021-12-04: 100 mL via INTRAVENOUS

## 2021-12-04 MED ORDER — OXYCODONE-ACETAMINOPHEN 5-325 MG PO TABS
1.0000 | ORAL_TABLET | Freq: Once | ORAL | Status: AC
  Administered 2021-12-04: 1 via ORAL
  Filled 2021-12-04: qty 1

## 2021-12-04 NOTE — ED Provider Notes (Signed)
[facility] Provider Note    Event Date/Time   First MD Initiated Contact with Patient 12/04/21 0208     (approximate)   History   Wound Check   HPI  [name] is a 66 y.o. male with a history of C7 spinal cord injury causing quadriparesis, afib, sacral decubitus ulcer who presents for evaluation of a sacral decubitus ulcer. He reports that his sacral pillow was too old and it was thrown out.  A new one has been ordered but has not been received yet.  Therefore over the last 2 months he has had progressively worsening pain in that area.   He has noticed some intermittent bleeding from it and he was concerned that it could be infected because he started having some chills and worsening pain in that area.  He has not been seen by a wound specialist.  He denies abdominal pain, cough, congestion, chest pain, dysuria or hematuria.       Physical Exam   Triage Vital Signs: ED Triage Vitals  Enc Vitals Group     BP 12/03/21 2252 (!) 110/55     Pulse Rate 12/03/21 2252 100     Resp 12/03/21 2252 16     Temp 12/03/21 2252 98.7 F (37.1 C)     Temp Source 12/03/21 2252 Oral     SpO2 12/03/21 2252 98 %     Weight 12/03/21 2253 167 lb (75.8 kg)     Height 12/03/21 2253 '6\' 4"'$  (1.93 m)     Head Circumference --      Peak Flow --      Pain Score 12/03/21 2253 5     Pain Loc --      Pain Edu? --      Excl. in [place]? --     Most recent vital signs: Vitals:   12/03/21 2252 12/04/21 0321  BP: (!) 110/55   Pulse: 100 83  Resp: 16 16  Temp: 98.7 F (37.1 C)   SpO2: 98% 99%     Constitutional: Alert and oriented. Well appearing and in no apparent distress. HEENT:      Head: Normocephalic and atraumatic.         Eyes: Conjunctivae are normal. Sclera is non-icteric.       Mouth/Throat: Mucous membranes are moist.       Neck: Supple with no signs of meningismus. Cardiovascular: Regular rate and rhythm. No murmurs, gallops, or rubs. 2+ symmetrical distal  pulses are present in all extremities.  Respiratory: Normal respiratory effort. Lungs are clear to auscultation bilaterally.  Gastrointestinal: Soft, non tender Genitourinary: Stage II sacral decubitus ulcer with no erythema, warmth, crepitus, bulla, necrosis, or pus Musculoskeletal:  No edema, cyanosis, or erythema of extremities. Neurologic: Normal speech and language. Face is symmetric. Moving all extremities. No gross focal neurologic deficits are appreciated. Skin: Skin is warm, dry and intact. No rash noted. Psychiatric: Mood and affect are normal. Speech and behavior are normal.  ED Results / Procedures / Treatments   Labs (all labs ordered are listed, but only abnormal results are displayed) Labs Reviewed  COMPREHENSIVE METABOLIC PANEL - Abnormal; Notable for the following components:      Result Value   Potassium 3.3 (*)    Glucose, Bld 117 (*)    BUN 24 (*)    All other components within normal limits  LACTIC ACID, PLASMA  CBC WITH DIFFERENTIAL/PLATELET     EKG  ED ECG REPORT I, Rudene Re,  the attending physician, personally viewed and interpreted this ECG.  Atrial fibrillation with a rate of 104, no ST elevations or depressions  RADIOLOGY I, Rudene Re, attending MD, have personally viewed and interpreted the images obtained during this visit as below:  CT with no signs of infection   ___________________________________________________ Interpretation by Radiologist:  CT PELVIS W CONTRAST  Result Date: 12/04/2021 CLINICAL DATA:  Concern for soft tissue infection. Bleeding wound on buttock. EXAM: CT PELVIS WITH CONTRAST TECHNIQUE: Multidetector CT imaging of the pelvis was performed using the standard protocol following the bolus administration of intravenous contrast. RADIATION DOSE REDUCTION: This exam was performed according to the departmental dose-optimization program which includes automated exposure control, adjustment of the mA and/or kV  according to patient size and/or use of iterative reconstruction technique. CONTRAST:  165m OMNIPAQUE IOHEXOL 300 MG/ML  SOLN COMPARISON:  None FINDINGS: Urinary Tract: Partially visualized left renal inferior pole 6 cm cyst. Multiple small stones noted layering along the posterior bladder. Mild thickened and trabeculated appearance of the bladder wall, likely related to chronic bladder outlet obstruction. Bowel: Postsurgical changes of the bowel with a left lower quadrant ostomy. No bowel dilatation within the pelvis. The appendix is normal. Vascular/Lymphatic: Mild aortoiliac atherosclerotic disease. No pelvic adenopathy. Reproductive: Heterogeneous appearance of the prostate gland. The prostate was better evaluated on the MRI of 11/21/2021. Other:  None no drainable fluid collection or abscess. Musculoskeletal: Osteopenia.  No acute osseous pathology. IMPRESSION: 1. No acute pelvic pathology. No drainable fluid collection or abscess. 2. Postsurgical changes of the bowel with a left lower quadrant ostomy. No bowel dilatation. 3. Aortic Atherosclerosis (ICD10-I70.0). Electronically Signed   By: AAnner CreteM.D.   On: 12/04/2021 03:08       PROCEDURES:  Critical Care performed: No  Procedures    IMPRESSION / MDM / ASSESSMENT AND PLAN / ED COURSE  I reviewed the triage vital signs and the nursing notes.  66y.o. male with a history of C7 spinal cord injury causing quadriparesis, afib, sacral decubitus ulcer who presents for evaluation of a sacral decubitus ulcer.  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  Nursing home has ordered 1 but has not received it yet.  On exam the ulcer looks no signs of infection, there is no pus, no crepitus, no erythema, no warmth, no necrosis, no bulla.  He does have a stage II decubitus ulcer.  A CT was done to, no leukocytosis, normal lactic acid.  Ensure no signs of deep infection  and that is negative.  There is no signs of sepsis with no fever, no tachycardia.  Appropriate dressing was placed and patient was referred to the wound clinic for close monitoring and care of his decubitus ulcer.  Discussed my standard return precautions  MEDICATIONS GIVEN IN ED: Medications  oxyCODONE-acetaminophen (PERCOCET/ROXICET) 5-325 MG per tablet 1 tablet (1 tablet Oral Given 12/04/21 0320)  iohexol (OMNIPAQUE) 300 MG/ML solution 100 mL (100 mLs Intravenous Contrast Given 12/04/21 0252)     [facility]    FINAL CLINICAL IMPRESSION(S) / ED DIAGNOSES   Final diagnoses:  Sacral decubitus ulcer, stage II (HBenavides     Rx / DC Orders   ED Discharge Orders          Ordered    Ambulatory referral to Wound Clinic       Comments:  Stage II decubitus ulcer, bed bound patient   12/04/21 0326             Note:  This document was prepared using Dragon voice recognition software and may include unintentional dictation errors.   Please note:  Patient was evaluated in Emergency Department today for the symptoms described in the history of present illness. Patient was evaluated in the context of the global COVID-19 pandemic, which necessitated consideration that the patient might be at risk for infection with the SARS-CoV-2 virus that causes COVID-19. Institutional protocols and algorithms that pertain to the evaluation of patients at risk for COVID-19 are in a state of rapid change based on information released by regulatory bodies including the CDC and federal and state organizations. These policies and algorithms were followed during the patient's care in the ED.  Some ED evaluations and interventions may be delayed as a result of limited staffing during the pandemic.       Alfred Levins, [place], MD 12/04/21 [phone]
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
## 2021-12-04 NOTE — Telephone Encounter (Signed)
Attempted to reach [name] at [facility] [phone]) to schedule pts appt however they are closed today. Left message for [name] to return my call.

## 2021-12-04 NOTE — ED Notes (Signed)
[facility] here for transport

## 2021-12-05 NOTE — Telephone Encounter (Signed)
Pt scheduled for office visit on 12/13/21. [name] would also like to know if she needs to schedule the prostate biopsy? Advised her I would find out and call her back.

## 2021-12-05 NOTE — Telephone Encounter (Signed)
[place] notified.

## 2021-12-13 ENCOUNTER — Encounter: Payer: Self-pay | Admitting: Urology

## 2021-12-13 ENCOUNTER — Ambulatory Visit: Payer: Medicare (Managed Care) | Admitting: Urology

## 2021-12-13 NOTE — Progress Notes (Incomplete)
12/13/21 8:59 AM   [name] 15-Jan-1956 [id number]  Referring provider:  [facility] [address] [place],  [place] [address] No chief complaint on file.   Urological history  1.  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 Left renal cysts -RUS 03/2021 - Stable left renal cysts   3. Elevated PSA -PSA 20.0 in 10/2021 - He is scheduled for a prostate biopsy in the OR with Dr Bernardo Heater   HPI: [name] is a 66 y.o.male who presents today fr pre-op urinalysis and urine culture.       PMH: No past medical history on file.  Surgical History: No past surgical history on file.  Home Medications:  Allergies as of 12/13/2021   No Known Allergies      Medication List        Accurate as of December 13, 2021  8:59 AM. If you have any questions, ask your nurse or doctor.          Cholecalciferol 25 MCG (1000 UT) tablet Take by mouth.   diltiazem 60 MG tablet Commonly known as: CARDIZEM Take by mouth.   docusate sodium 100 MG capsule Commonly known as: COLACE Take 100 mg by mouth daily as needed.   gabapentin 300 MG capsule Commonly known as: NEURONTIN Take 300 mg by mouth at bedtime.   midodrine 2.5 MG tablet Commonly known as: PROAMATINE Take 2.5 mg by mouth daily.   potassium chloride 10 MEQ tablet Commonly known as: KLOR-CON Take by mouth.        Allergies:  No Known Allergies  Family History: No family history on file.  Social History:  reports that he has never smoked. He has never used smokeless tobacco. He reports that he does not currently use alcohol. No history on file for drug use.   Physical Exam: There were no vitals taken for this visit.  Constitutional:  Alert and oriented, No acute distress. HEENT: [place] AT, moist mucus membranes.  Trachea midline, no masses. Cardiovascular: No clubbing,  cyanosis, or edema. Respiratory: Normal respiratory effort, no increased work of breathing. Skin: No rashes, bruises or suspicious lesions. Neurologic: Grossly intact, no focal deficits, moving all 4 extremities. Psychiatric: Normal mood and affect.  Laboratory Data: Lab Results  Component Value Date   CREATININE 0.94 12/03/2021   Component     Latest Ref Rng 11/02/2021  Prostate Specific Ag, Serum     0.0 - 4.0 ng/mL 20.0 (H)     Legend: (H) High  Component     Latest Ref Rng 12/03/2021  Lactic Acid, Venous     0.5 - 1.9 mmol/L 1.1    Component     Latest Ref Rng 12/03/2021  Sodium     135 - 145 mmol/L 142   Potassium     3.5 - 5.1 mmol/L 3.3 (L)   Chloride     98 - 111 mmol/L 109   CO2     22 - 32 mmol/L 26   Glucose     70 - 99 mg/dL 117 (H)   BUN     8 - 23 mg/dL 24 (H)   Creatinine     0.61 - 1.24 mg/dL 0.94   Calcium     8.9 - 10.3 mg/dL 9.0   Total Protein     6.5 - 8.1 g/dL 7.0   Albumin     3.5 -  5.0 g/dL 3.6   AST     15 - 41 U/L 17   ALT     0 - 44 U/L 17   Alkaline Phosphatase     38 - 126 U/L 95   Total Bilirubin     0.3 - 1.2 mg/dL 0.8   GFR, Estimated     >60 mL/min >60   Anion gap     5 - 15  7     Legend: (L) Low (H) High  Component     Latest Ref Rng 12/03/2021  WBC, UA     0 - 5 /hpf   RBC     4.22 - 5.81 MIL/uL 4.91   Epithelial Cells (non renal)     0 - 10 /hpf   Renal Epithel, UA     None seen /hpf   Bacteria, UA     None seen/Few    WBC     4.0 - 10.5 K/uL 7.2   Hemoglobin     13.0 - 17.0 g/dL 14.1   HCT     39.0 - 52.0 % 44.4   MCV     80.0 - 100.0 fL 90.4   MCH     26.0 - 34.0 pg 28.7   MCHC     30.0 - 36.0 g/dL 31.8   RDW     11.5 - 15.5 % 12.7   Platelets     150 - 400 K/uL 180   nRBC     0.0 - 0.2 % 0.0   Neutrophils     % 75   NEUT#     1.7 - 7.7 K/uL 5.3   Lymphocytes     % 16   Lymphocyte #     0.7 - 4.0 K/uL 1.1   Monocytes Relative     % 8   Monocyte #     0.1 - 1.0 K/uL 0.6    Eosinophil     % 1   Eosinophils Absolute     0.0 - 0.5 K/uL 0.1   Basophil     % 0   Basophils Absolute     0.0 - 0.1 K/uL 0.0   Immature Granulocytes     % 0   Abs Immature Granulocytes     0.00 - 0.07 K/uL 0.01    I have reviewed the labs.   Urinalysis   Pertinent Imaging:   Assessment & Plan:     No follow-ups on file.  [facility] [address] [place], [place] [address] [phone]  I,Kailey Littlejohn,acting as a scribe for [facility], PA-C.,have documented all relevant documentation on the behalf of SHANNON MCGOWAN, PA-C,as directed by  [facility], PA-C while in the presence of Marcus Hook, PA-C.

## 2021-12-14 ENCOUNTER — Telehealth: Payer: Self-pay

## 2021-12-14 NOTE — Telephone Encounter (Signed)
Dr. Conley Rolls from [facility] left a message on the triage line apologizing for patient not making it to his recent visit due to transportation issues. She would like for you to call her if possible to discuss treatment plan. Her personal cell humber is [phone]

## 2021-12-18 ENCOUNTER — Encounter: Payer: Medicare (Managed Care) | Attending: Physician Assistant | Admitting: Physician Assistant

## 2021-12-18 DIAGNOSIS — G8254 Quadriplegia, C5-C7 incomplete: Secondary | ICD-10-CM | POA: Insufficient documentation

## 2021-12-18 DIAGNOSIS — I480 Paroxysmal atrial fibrillation: Secondary | ICD-10-CM | POA: Insufficient documentation

## 2021-12-18 DIAGNOSIS — L89153 Pressure ulcer of sacral region, stage 3: Secondary | ICD-10-CM | POA: Diagnosis not present

## 2021-12-18 NOTE — Progress Notes (Signed)
[name], [name] ([id number]) Visit Report for 12/18/2021 Allergy List Details Patient Name: [name], [name] Date of Service: 12/18/2021 12:00 PM Medical Record Number: [id number] Patient Account Number: [id number] Date of Birth/Sex: [birth year] (66 y.o. M) Treating RN: Cornell Barman Primary Care Michaelanthony Kempton: Leward Quan Other Clinician: Referring Ruston Fedora: Alfred Levins, [place] Treating Lonny Eisen/Extender: Skipper Cliche in Treatment: 0 Allergies Active Allergies No Known Drug Allergies Allergy Notes Electronic Signature(s) Signed: 12/18/2021 2:38:00 PM By: Gretta Cool, BSN, RN, CWS, Kim RN, BSN Entered By: Gretta Cool, BSN, RN, CWS, Kim on 12/18/2021 12:14:04 [name] ([id number]) -------------------------------------------------------------------------------- Arrival Information Details Patient Name: [name] Date of Service: 12/18/2021 12:00 PM Medical Record Number: [id number] Patient Account Number: [id number] Date of Birth/Sex: [birth year] (66 y.o. M) Treating RN: Cornell Barman Primary Care Jaesean Litzau: Leward Quan Other Clinician: Referring Pearline Yerby: Alfred Levins, [place] Treating Dawnna Gritz/Extender: Skipper Cliche in Treatment: 0 Visit Information Patient Arrived: Wheel Chair Arrival Time: 12:07 Accompanied By: sister Transfer Assistance: Transfer Board Patient Identification Verified: Yes Secondary Verification Process Completed: Yes Patient Has Alerts: Yes Patient Alerts: NOT DIABETIC Electronic Signature(s) Signed: 12/18/2021 2:38:00 PM By: Gretta Cool, BSN, RN, CWS, Kim RN, BSN Entered By: Gretta Cool, BSN, RN, CWS, Kim on 12/18/2021 12:11:23 [name] ([id number]) -------------------------------------------------------------------------------- Clinic Level of Care Assessment Details Patient Name: [name] Date of Service: 12/18/2021 12:00 PM Medical Record Number: [id number] Patient Account Number: [id number] Date of Birth/Sex: [birth year] (66 y.o. M) Treating RN: Cornell Barman Primary Care  Valissa Lyvers: Leward Quan Other Clinician: Referring Katelynn Heidler: Alfred Levins, [place] Treating Daisee Centner/Extender: Skipper Cliche in Treatment: 0 Clinic Level of Care Assessment Items TOOL 1 Quantity Score '[]'$  - Use when EandM and Procedure is performed on INITIAL visit 0 ASSESSMENTS - Nursing Assessment / Reassessment X - General Physical Exam (combine w/ comprehensive assessment (listed just below) when performed on new 1 20 pt. evals) X- 1 25 Comprehensive Assessment (HX, ROS, Risk Assessments, Wounds Hx, etc.) ASSESSMENTS - Wound and Skin Assessment / Reassessment '[]'$  - Dermatologic / Skin Assessment (not related to wound area) 0 ASSESSMENTS - Ostomy and/or Continence Assessment and Care '[]'$  - Incontinence Assessment and Management 0 '[]'$  - 0 Ostomy Care Assessment and Management (repouching, etc.) PROCESS - Coordination of Care X - Simple Patient / Family Education for ongoing care 1 15 '[]'$  - 0 Complex (extensive) Patient / Family Education for ongoing care X- 1 10 Staff obtains Programmer, systems, Records, Test Results / Process Orders '[]'$  - 0 Staff telephones HHA, Nursing Homes / Clarify orders / etc '[]'$  - 0 Routine Transfer to another Facility (non-emergent condition) '[]'$  - 0 Routine Hospital Admission (non-emergent condition) X- 1 15 New Admissions / Biomedical engineer / Ordering NPWT, Apligraf, etc. '[]'$  - 0 Emergency Hospital Admission (emergent condition) PROCESS - Special Needs '[]'$  - Pediatric / Minor Patient Management 0 '[]'$  - 0 Isolation Patient Management '[]'$  - 0 Hearing / Language / Visual special needs '[]'$  - 0 Assessment of Community assistance (transportation, D/C planning, etc.) '[]'$  - 0 Additional assistance / Altered mentation '[]'$  - 0 Support Surface(s) Assessment (bed, cushion, seat, etc.) INTERVENTIONS - Miscellaneous '[]'$  - External ear exam 0 X- 1 10 Patient Transfer (multiple staff / Civil Service fast streamer / Similar devices) '[]'$  - 0 Simple Staple / Suture removal (25 or less) '[]'$  -  0 Complex Staple / Suture removal (26 or more) '[]'$  - 0 Hypo/Hyperglycemic Management (do not check if billed separately) '[]'$  - 0 Ankle / Brachial Index (ABI) - do not check if billed separately Has the patient been seen at the hospital within the last  three years: Yes Total Score: 95 Level Of Care: New/Established - Level 3 [facility], [name] ([id number]) Electronic Signature(s) Signed: 12/18/2021 2:38:00 PM By: Gretta Cool, BSN, RN, CWS, Kim RN, BSN Entered By: Gretta Cool, BSN, RN, CWS, Kim on 12/18/2021 13:05:17 [name], [name] ([id number]) -------------------------------------------------------------------------------- Encounter Discharge Information Details Patient Name: [name] Date of Service: 12/18/2021 12:00 PM Medical Record Number: [id number] Patient Account Number: [id number] Date of Birth/Sex: Apr 06, 1956 (66 y.o. M) Treating RN: Cornell Barman Primary Care Harshil Cavallaro: Leward Quan Other Clinician: Referring Epic Tribbett: Alfred Levins, [place] Treating Ivory Maduro/Extender: Skipper Cliche in Treatment: 0 Encounter Discharge Information Items Discharge Condition: Stable Ambulatory Status: Wheelchair Discharge Destination: Home Transportation: Private Auto Accompanied By: sister Schedule Follow-up Appointment: Yes Clinical Summary of Care: Electronic Signature(s) Signed: 12/18/2021 2:31:32 PM By: Gretta Cool, BSN, RN, CWS, Kim RN, BSN Entered By: Gretta Cool, BSN, RN, CWS, Kim on 12/18/2021 14:31:32 [name], [name] ([id number]) -------------------------------------------------------------------------------- Lower Extremity Assessment Details Patient Name: [name] Date of Service: 12/18/2021 12:00 PM Medical Record Number: [id number] Patient Account Number: [id number] Date of Birth/Sex: 1956/04/14 (66 y.o. M) Treating RN: Cornell Barman Primary Care Garin Mata: Leward Quan Other Clinician: Referring Krystalle Pilkington: Alfred Levins, [place] Treating Payeton Germani/Extender: Skipper Cliche in Treatment: 0 Electronic  Signature(s) Signed: 12/18/2021 2:38:00 PM By: Gretta Cool, BSN, RN, CWS, Kim RN, BSN Entered By: Gretta Cool, BSN, RN, CWS, Kim on 12/18/2021 12:27:36 [name], [name] ([id number]) -------------------------------------------------------------------------------- Multi Wound Chart Details Patient Name: [name] Date of Service: 12/18/2021 12:00 PM Medical Record Number: [id number] Patient Account Number: [id number] Date of Birth/Sex: 1955-09-19 (66 y.o. M) Treating RN: Cornell Barman Primary Care Ravinder Lukehart: Leward Quan Other Clinician: Referring Lanyiah Brix: Alfred Levins, [place] Treating Adrielle Polakowski/Extender: Skipper Cliche in Treatment: 0 Vital Signs Height(in): 76 Pulse(bpm): 80 Weight(lbs): 167 Blood Pressure(mmHg): 81/48 Body Mass Index(BMI): 20.3 Temperature(F): 98.2 Respiratory Rate(breaths/min): 16 Photos: [1:No Photos] [N/A:N/A] Wound Location: [1:Midline Sacrum] [N/A:N/A] Wounding Event: [1:Pressure Injury] [N/A:N/A] Primary Etiology: [1:Pressure Ulcer] [N/A:N/A] Comorbid History: [1:Arrhythmia, Hypotension, History of N/A pressure wounds, Quadriplegia] Date Acquired: [1:10/18/2021] [N/A:N/A] Weeks of Treatment: [1:0] [N/A:N/A] Wound Status: [1:Open] [N/A:N/A] Wound Recurrence: [1:No] [N/A:N/A] Measurements L x W x D (cm) [1:4.2x0.7x0.2] [N/A:N/A] Area (cm) : [1:2.309] [N/A:N/A] Volume (cm) : [1:0.462] [N/A:N/A] Classification: [1:Category/Stage II] [N/A:N/A] Exudate Amount: [1:Medium] [N/A:N/A] Exudate Type: [1:Serous] [N/A:N/A] Exudate Color: [1:amber] [N/A:N/A] Wound Margin: [1:Flat and Intact] [N/A:N/A] Granulation Amount: [1:Medium (34-66%)] [N/A:N/A] Granulation Quality: [1:Red] [N/A:N/A] Necrotic Amount: [1:Medium (34-66%)] [N/A:N/A] Exposed Structures: [1:Fat Layer (Subcutaneous Tissue): N/A Yes Fascia: No Tendon: No Muscle: No Joint: No Bone: No None] [N/A:N/A] Treatment Notes Electronic Signature(s) Signed: 12/18/2021 2:38:00 PM By: Gretta Cool, BSN, RN, CWS, Kim RN, BSN Entered By:  Gretta Cool, BSN, RN, CWS, Kim on 12/18/2021 13:02:57 [name], [name] ([id number]) -------------------------------------------------------------------------------- Multi-Disciplinary Care Plan Details Patient Name: [name] Date of Service: 12/18/2021 12:00 PM Medical Record Number: [id number] Patient Account Number: [id number] Date of Birth/Sex: 25-Feb-1956 (66 y.o. M) Treating RN: Cornell Barman Primary Care Jorie Zee: Leward Quan Other Clinician: Referring Rilynne Lonsway: Alfred Levins, [place] Treating Leovanni Bjorkman/Extender: Skipper Cliche in Treatment: 0 Active Inactive Necrotic Tissue Nursing Diagnoses: Impaired tissue integrity related to necrotic/devitalized tissue Knowledge deficit related to management of necrotic/devitalized tissue Goals: Necrotic/devitalized tissue will be minimized in the wound bed Date Initiated: 12/18/2021 Target Resolution Date: 01/01/2022 Goal Status: Active Patient/caregiver will verbalize understanding of reason and process for debridement of necrotic tissue Date Initiated: 12/18/2021 Target Resolution Date: 01/01/2022 Goal Status: Active Interventions: Assess patient pain level pre-, during and post procedure and prior to discharge Provide education on necrotic tissue and debridement process Treatment Activities: Apply topical anesthetic as  ordered : 12/18/2021 Notes: Orientation to the Wound Care Program Nursing Diagnoses: Knowledge deficit related to the wound healing [name] program Goals: Patient/caregiver will verbalize understanding of the [place] Date Initiated: 12/18/2021 Target Resolution Date: 01/01/2022 Goal Status: Active Interventions: Provide education on orientation to the wound [name] Notes: Pressure Nursing Diagnoses: Knowledge deficit related to management of pressures ulcers Goals: Patient will remain free from development of additional pressure ulcers Date Initiated: 12/18/2021 Target Resolution Date: 12/18/2021 Goal Status:  Active Patient will remain free of pressure ulcers Date Initiated: 12/18/2021 Target Resolution Date: 12/18/2021 Goal Status: Active Patient/caregiver will verbalize risk factors for pressure ulcer development Date Initiated: 12/18/2021 Target Resolution Date: 12/18/2021 Goal Status: Active Patient/caregiver will verbalize understanding of pressure ulcer management [name], [name] ([id number]) Date Initiated: 12/18/2021 Target Resolution Date: 12/18/2021 Goal Status: Active Interventions: Assess: immobility, friction, shearing, incontinence upon admission and as needed Assess offloading mechanisms upon admission and as needed Assess potential for pressure ulcer upon admission and as needed Provide education on pressure ulcers Treatment Activities: Patient referred for seating evaluation to ensure proper offloading : 12/18/2021 Notes: Wound/Skin Impairment Nursing Diagnoses: Impaired tissue integrity Knowledge deficit related to smoking impact on wound healing Knowledge deficit related to ulceration/compromised skin integrity Goals: Patient/caregiver will verbalize understanding of skin care regimen Date Initiated: 12/18/2021 Target Resolution Date: 12/18/2021 Goal Status: Active Ulcer/skin breakdown will have a volume reduction of 30% by week 4 Date Initiated: 12/18/2021 Target Resolution Date: 01/15/2022 Goal Status: Active Interventions: Assess ulceration(s) every visit Treatment Activities: Referred to DME Darsi Tien for dressing supplies : 12/18/2021 Skin care regimen initiated : 12/18/2021 Topical wound management initiated : 12/18/2021 Notes: Electronic Signature(s) Signed: 12/18/2021 2:38:00 PM By: Gretta Cool, BSN, RN, CWS, Kim RN, BSN Entered By: Gretta Cool, BSN, RN, CWS, Kim on 12/18/2021 13:05:53 [name], [name] ([id number]) -------------------------------------------------------------------------------- Pain Assessment Details Patient Name: [name] Date of Service: 12/18/2021 12:00 PM Medical  Record Number: [id number] Patient Account Number: [id number] Date of Birth/Sex: 06/12/1956 (66 y.o. M) Treating RN: Cornell Barman Primary Care Batool Majid: Leward Quan Other Clinician: Referring Gloria Ricardo: Alfred Levins, [place] Treating Jamileth Putzier/Extender: Skipper Cliche in Treatment: 0 Active Problems Location of Pain Severity and Description of Pain Patient Has Paino Yes Site Locations Pain Location: Pain in Ulcers Duration of the Pain. Constant / Intermittento Intermittent Rate the pain. Current Pain Level: 7 Least Pain Level: 3 Pain Management and Medication Current Pain Management: Notes Patient has less pain when he can shift around. Electronic Signature(s) Signed: 12/18/2021 2:38:00 PM By: Gretta Cool, BSN, RN, CWS, Kim RN, BSN Entered By: Gretta Cool, BSN, RN, CWS, Kim on 12/18/2021 12:12:40 [name] ([id number]) -------------------------------------------------------------------------------- Patient/Caregiver Education Details Patient Name: [name] Date of Service: 12/18/2021 12:00 PM Medical Record Number: [id number] Patient Account Number: [id number] Date of Birth/Gender: 28-May-1956 (66 y.o. M) Treating RN: Cornell Barman Primary Care Physician: Leward Quan Other Clinician: Referring Physician: Alfred Levins, [place] Treating Physician/Extender: Skipper Cliche in Treatment: 0 Education Assessment Education Provided To: Patient and Caregiver Education Topics Provided Pressure: Handouts: Pressure Ulcers: Care and Offloading Methods: Demonstration, Explain/Verbal Welcome To [facility]: Handouts: Welcome To [facility] Methods: Demonstration, Explain/Verbal Wound/Skin Impairment: Handouts: Caring for Your Ulcer Methods: Explain/Verbal Electronic Signature(s) Signed: 12/18/2021 2:38:00 PM By: Gretta Cool, BSN, RN, CWS, Kim RN, BSN Entered By: Gretta Cool, BSN, RN, CWS, Kim on 12/18/2021 13:06:29 [name], [name]  ([id number]) -------------------------------------------------------------------------------- Wound Assessment Details Patient Name: [name] Date of Service: 12/18/2021 12:00 PM Medical Record Number: [id number] Patient Account Number: [id number] Date of Birth/Sex: 15-Nov-1955 (65  y.o. M) Treating RN: Cornell Barman Primary Care Aeron Donaghey: Leward Quan Other Clinician: Referring Shanzay Hepworth: Alfred Levins, [place] Treating Rochell Mabie/Extender: Skipper Cliche in Treatment: 0 Wound Status Wound Number: 1 Primary Pressure Ulcer Etiology: Wound Location: Midline Sacrum Wound Status: Open Wounding Event: Pressure Injury Comorbid Arrhythmia, Hypotension, History of pressure wounds, Date Acquired: 10/18/2021 History: Quadriplegia Weeks Of Treatment: 0 Clustered Wound: No Wound Measurements Length: (cm) 4.2 Width: (cm) 0.7 Depth: (cm) 0.2 Area: (cm) 2.309 Volume: (cm) 0.462 % Reduction in Area: 0% % Reduction in Volume: 0% Epithelialization: None Tunneling: No Undermining: No Wound Description Classification: Category/Stage III Wound Margin: Flat and Intact Exudate Amount: Medium Exudate Type: Serous Exudate Color: amber Foul Odor After Cleansing: No Slough/Fibrino Yes Wound Bed Granulation Amount: Medium (34-66%) Exposed Structure Granulation Quality: Red Fascia Exposed: No Necrotic Amount: Medium (34-66%) Fat Layer (Subcutaneous Tissue) Exposed: Yes Necrotic Quality: Adherent Slough Tendon Exposed: No Muscle Exposed: No Joint Exposed: No Bone Exposed: No Treatment Notes Wound #1 (Sacrum) Wound Laterality: Midline Cleanser Soap and Water Discharge Instruction: Gently cleanse wound with antibacterial soap, rinse and pat dry prior to dressing wounds Peri-Wound Care Topical Primary Dressing Silvercel Small 2x2 (in/in) Discharge Instruction: Apply Silvercel Small 2x2 (in/in) as instructed Secondary Dressing (SILICONE BORDER) Zetuvit Plus SILICONE BORDER Dressing 4x4  (in/in) Discharge Instruction: Please do not put silicone bordered dressings under wraps. Use non-bordered dressing only. Secured With [employer], [place] ([id number]) Compression Stockings Add-Ons Electronic Signature(s) Signed: 12/18/2021 2:29:09 PM By: Worthy Keeler PA-C Signed: 12/18/2021 2:38:00 PM By: Gretta Cool, BSN, RN, CWS, Kim RN, BSN Entered By: Worthy Keeler on 12/18/2021 14:29:09 [facility] [place] ([id number]) -------------------------------------------------------------------------------- Vitals Details Patient Name: [name] Date of Service: 12/18/2021 12:00 PM Medical Record Number: [id number] Patient Account Number: [id number] Date of Birth/Sex: 06/05/1956 (66 y.o. M) Treating RN: Cornell Barman Primary Care Vashon Riordan: Leward Quan Other Clinician: Referring Haillee Johann: Alfred Levins, [place] Treating Brenee Gajda/Extender: Skipper Cliche in Treatment: 0 Vital Signs Time Taken: 12:13 Temperature (F): 98.2 Height (in): 76 Pulse (bpm): 80 Weight (lbs): 167 Respiratory Rate (breaths/min): 16 Body Mass Index (BMI): 20.3 Blood Pressure (mmHg): 81/48 Reference Range: 80 - 120 mg / dl Electronic Signature(s) Signed: 12/18/2021 2:38:00 PM By: Gretta Cool, BSN, RN, CWS, Kim RN, BSN Entered By: Gretta Cool, BSN, RN, CWS, Kim on 12/18/2021 12:13:41

## 2021-12-18 NOTE — Progress Notes (Signed)
ROSHAD, HACK ([id number]) Visit Report for 12/18/2021 Chief Complaint Document Details Patient Name: [name], [name] Date of Service: 12/18/2021 12:00 PM Medical Record Number: [id number] Patient Account Number: [id number] Date of Birth/Sex: 1955/12/30 (66 y.o. M) Treating RN: Cornell Barman Primary Care Provider: Leward Quan Other Clinician: Referring Provider: Alfred Levins, [place] Treating Provider/Extender: Skipper Cliche in Treatment: 0 Information Obtained from: Patient Chief Complaint Sacral pressure ulcer Electronic Signature(s) Signed: 12/18/2021 12:37:39 PM By: Worthy Keeler PA-C Entered By: Worthy Keeler on 12/18/2021 12:37:39 [name], [name] ([id number]) -------------------------------------------------------------------------------- HPI Details Patient Name: [name] Date of Service: 12/18/2021 12:00 PM Medical Record Number: [id number] Patient Account Number: [id number] Date of Birth/Sex: [birth year] (65 y.o. M) Treating RN: Cornell Barman Primary Care Provider: Leward Quan Other Clinician: Referring Provider: Alfred Levins, [place] Treating Provider/Extender: Skipper Cliche in Treatment: 0 History of Present Illness HPI Description: 12-18-2021 upon evaluation today patient presents for initial evaluation here in the clinic concerning an issue he has been having over the sacral area where he has a wound this is a stage III pressure ulcer. It also appears there may still be some moisture component to this based on what I am seeing. I do not see any evidence of infection but at the same time I do believe that the patient is in need of some additional and hopefully better dressings in order to help this heal more appropriately and quickly. Currently just a protective dressing has been used over this sounds like may possibly be a border foam. The patient did have a CT scan which was negative for osteomyelitis he had a previous wound above this in the sacral region but not necessarily  right of this area. 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 He also has atrial fibrillation and hypotension. Electronic Signature(s) Signed: 12/18/2021 2:30:01 PM By: Worthy Keeler PA-C Entered By: Worthy Keeler on 12/18/2021 14:30:01 [name], [name] ([id number]) -------------------------------------------------------------------------------- Physical Exam Details Patient Name: [name] Date of Service: 12/18/2021 12:00 PM Medical Record Number: [id number] Patient Account Number: [id number] Date of Birth/Sex: 07-05-1955 (65 y.o. M) Treating RN: Cornell Barman Primary Care Provider: Leward Quan Other Clinician: Referring Provider: Alfred Levins, [place] Treating Provider/Extender: Skipper Cliche in Treatment: 0 Constitutional patient is hypotensive.. respirations regular, non-labored and within target range for patient.Marland Kitchen temperature within target range for patient.. Well- nourished and well-hydrated in no acute distress. Eyes conjunctiva clear no eyelid edema noted. pupils equal round and reactive to light and accommodation. Ears, Nose, Mouth, and Throat no gross abnormality of ear auricles or external auditory canals. normal hearing noted during conversation. mucus membranes moist. Respiratory normal breathing without difficulty. Cardiovascular 2+ dorsalis pedis/posterior tibialis pulses. no clubbing, cyanosis, significant edema, <3 sec cap refill. Psychiatric this patient is able to make decisions and demonstrates good insight into disease process. Alert and Oriented x 3. pleasant and cooperative. Notes Upon inspection patient's wound bed actually showed signs of good granulation and epithelization at this point. Fortunately I do not see any evidence of active infection which is great news and overall I am extremely pleased with where we stand today. I do think moisture in this wound fold and in itself is good to be part of the problem here going  forward we need to do something about getting that under control and I discussed that with the patient today. Electronic Signature(s) Signed: 12/18/2021 2:31:06 PM By: Worthy Keeler PA-C Entered By: Worthy Keeler on 12/18/2021 14:31:06 [name], [name] ([id number]) -------------------------------------------------------------------------------- Physician Orders  Details Patient Name: [name], [name] Date of Service: 12/18/2021 12:00 PM Medical Record Number: [id number] Patient Account Number: [id number] Date of Birth/Sex: [birth year] (66 y.o. M) Treating RN: Cornell Barman Primary Care Provider: Leward Quan Other Clinician: Referring Provider: Alfred Levins, [place] Treating Provider/Extender: Skipper Cliche in Treatment: 0 Verbal / Phone Orders: No Diagnosis Coding ICD-10 Coding Code Description L89.153 Pressure ulcer of sacral region, stage 3 G82.54 Quadriplegia, C5-C7 incomplete I48.0 Paroxysmal atrial fibrillation I95.89 Other hypotension Follow-up Appointments Wound #1 Midline Sacrum o Return Appointment in 2 weeks. Home Health Wound #1 [place]: - [facility] Bathing/ Shower/ Hygiene o May shower; gently cleanse wound with antibacterial soap, rinse and pat dry prior to dressing wounds Off-Loading o Roho cushion for wheelchair Additional Orders / Instructions o Follow Nutritious Diet and Increase Protein Intake Wound Treatment Wound #1 - Sacrum Wound Laterality: Midline Cleanser: Soap and Water Discharge Instructions: Gently cleanse wound with antibacterial soap, rinse and pat dry prior to dressing wounds Primary Dressing: Silvercel Small 2x2 (in/in) Discharge Instructions: Apply Silvercel Small 2x2 (in/in) as instructed Secondary Dressing: (SILICONE BORDER) Zetuvit Plus SILICONE BORDER Dressing 4x4 (in/in) Discharge Instructions: Please do not put silicone bordered dressings under wraps. Use non-bordered dressing only. Electronic  Signature(s) Signed: 12/18/2021 2:38:00 PM By: Gretta Cool, BSN, RN, CWS, Kim RN, BSN Signed: 12/18/2021 3:51:56 PM By: Worthy Keeler PA-C Entered By: Gretta Cool BSN, RN, CWS, Kim on 12/18/2021 13:04:55 [name], [name] ([id number]) -------------------------------------------------------------------------------- Problem List Details Patient Name: [name] Date of Service: 12/18/2021 12:00 PM Medical Record Number: [id number] Patient Account Number: [id number] Date of Birth/Sex: June 28, 1955 (65 y.o. M) Treating RN: Cornell Barman Primary Care Provider: Leward Quan Other Clinician: Referring Provider: Alfred Levins, [place] Treating Provider/Extender: Jeri Cos Weeks in Treatment: 0 Active Problems ICD-10 Encounter Code Description Active Date MDM Diagnosis L89.153 Pressure ulcer of sacral region, stage 3 12/18/2021 No Yes G82.54 Quadriplegia, C5-C7 incomplete 12/18/2021 No Yes I48.0 Paroxysmal atrial fibrillation 12/18/2021 No Yes I95.89 Other hypotension 12/18/2021 No Yes Inactive Problems Resolved Problems Electronic Signature(s) Signed: 12/18/2021 12:37:28 PM By: Worthy Keeler PA-C Entered By: Worthy Keeler on 12/18/2021 12:37:27 [name], [name] ([id number]) -------------------------------------------------------------------------------- Progress Note Details Patient Name: [name] Date of Service: 12/18/2021 12:00 PM Medical Record Number: [id number] Patient Account Number: [id number] Date of Birth/Sex: [birth year] (65 y.o. M) Treating RN: Cornell Barman Primary Care Provider: Leward Quan Other Clinician: Referring Provider: Alfred Levins, [place] Treating Provider/Extender: Skipper Cliche in Treatment: 0 Subjective Chief Complaint Information obtained from Patient Sacral pressure ulcer History of Present Illness (HPI) 12-18-2021 upon evaluation today patient presents for initial evaluation here in the clinic concerning an issue he has been having over the sacral area where he has a wound this is a  stage III pressure ulcer. It also appears there may still be some moisture component to this based on what I am seeing. I do not see any evidence of infection but at the same time I do believe that the patient is in need of some additional and hopefully better dressings in order to help this heal more appropriately and quickly. Currently just a protective dressing has been used over this sounds like may possibly be a border foam. The patient did have a CT scan which was negative for osteomyelitis he had a previous wound above this in the sacral region but not necessarily right of this area. 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 He also has atrial fibrillation and hypotension. Patient History Information obtained  from Chart. Allergies No Known Drug Allergies Social History Former smoker, Marital Status - Single, Alcohol Use - Moderate, Drug Use - No History, Caffeine Use - Daily. Medical History Cardiovascular Patient has history of Arrhythmia - afib, Hypotension Integumentary (Skin) Patient has history of History of pressure wounds - yes Neurologic Patient has history of Quadriplegia - C5-C7 1976 Medical And Surgical History Notes Gastrointestinal colostomy Review of Systems (ROS) Integumentary (Skin) Complains or has symptoms of Wounds. Objective Constitutional patient is hypotensive.. respirations regular, non-labored and within target range for patient.Marland Kitchen temperature within target range for patient.. Well- nourished and well-hydrated in no acute distress. Vitals Time Taken: 12:13 PM, Height: 76 in, Weight: 167 lbs, BMI: 20.3, Temperature: 98.2 F, Pulse: 80 bpm, Respiratory Rate: 16 breaths/min, Blood Pressure: 81/48 mmHg. Eyes conjunctiva clear no eyelid edema noted. pupils equal round and reactive to light and accommodation. Ears, Nose, Mouth, and Throat Wehrly, Eldar ([id number]) no gross abnormality of ear auricles or external auditory  canals. normal hearing noted during conversation. mucus membranes moist. Respiratory normal breathing without difficulty. Cardiovascular 2+ dorsalis pedis/posterior tibialis pulses. no clubbing, cyanosis, significant edema, Psychiatric this patient is able to make decisions and demonstrates good insight into disease process. Alert and Oriented x 3. pleasant and cooperative. General Notes: Upon inspection patient's wound bed actually showed signs of good granulation and epithelization at this point. Fortunately I do not see any evidence of active infection which is great news and overall I am extremely pleased with where we stand today. I do think moisture in this wound fold and in itself is good to be part of the problem here going forward we need to do something about getting that under control and I discussed that with the patient today. Integumentary (Hair, Skin) Wound #1 status is Open. Original cause of wound was Pressure Injury. The date acquired was: 10/18/2021. The wound is located on the Midline Sacrum. The wound measures 4.2cm length x 0.7cm width x 0.2cm depth; 2.309cm^2 area and 0.462cm^3 volume. There is Fat Layer (Subcutaneous Tissue) exposed. There is no tunneling or undermining noted. There is a medium amount of serous drainage noted. The wound margin is flat and intact. There is medium (34-66%) red granulation within the wound bed. There is a medium (34-66%) amount of necrotic tissue within the wound bed including Adherent Slough. Assessment Active Problems ICD-10 Pressure ulcer of sacral region, stage 3 Quadriplegia, C5-C7 incomplete Paroxysmal atrial fibrillation Other hypotension Plan Follow-up Appointments: Wound #1 Midline Sacrum: Return Appointment in 2 weeks. Home Health: Wound #1 Midline Sacrum: [place]: - [facility] Bathing/ Shower/ Hygiene: May shower; gently cleanse wound with antibacterial soap, rinse and pat dry prior to dressing  wounds Off-Loading: Roho cushion for wheelchair Additional Orders / Instructions: Follow Nutritious Diet and Increase Protein Intake WOUND #1: - Sacrum Wound Laterality: Midline Cleanser: Soap and Water Discharge Instructions: Gently cleanse wound with antibacterial soap, rinse and pat dry prior to dressing wounds Primary Dressing: Silvercel Small 2x2 (in/in) Discharge Instructions: Apply Silvercel Small 2x2 (in/in) as instructed Secondary Dressing: (SILICONE BORDER) Zetuvit Plus SILICONE BORDER Dressing 4x4 (in/in) Discharge Instructions: Please do not put silicone bordered dressings under wraps. Use non-bordered dressing only. 1. I am good recommend that we go ahead and initiate treatment with a silver alginate dressing which I think is going to be the appropriate thing to do here. The patient is in agreement with that plan. This includes the use of a bordered foam dressing behind 2 pad as well as The Excessive  Drainage. 2. I Am Also Going to Recommend that we also have the patient continue with appropriate offloading to prevent any breakdown again I think that he is doing a pretty good job here I see no signs of active damage to the tissue which is good news. We will see patient back for reevaluation in 1 week here in the clinic. If anything worsens or changes patient will contact our office for additional recommendations. [name], [name] ([id number]) Electronic Signature(s) Signed: 12/18/2021 2:32:24 PM By: Worthy Keeler PA-C Entered By: Worthy Keeler on 12/18/2021 14:32:24 [name], [name] ([id number]) -------------------------------------------------------------------------------- ROS/PFSH Details Patient Name: [name] Date of Service: 12/18/2021 12:00 PM Medical Record Number: [id number] Patient Account Number: [id number] Date of Birth/Sex: 1956-04-22 (65 y.o. M) Treating RN: Cornell Barman Primary Care Provider: Leward Quan Other Clinician: Referring Provider: Alfred Levins,  [place] Treating Provider/Extender: Skipper Cliche in Treatment: 0 Information Obtained From Chart Integumentary (Skin) Complaints and Symptoms: Positive for: Wounds Medical History: Positive for: History of pressure wounds - yes Cardiovascular Medical History: Positive for: Arrhythmia - afib; Hypotension Gastrointestinal Medical History: Past Medical History Notes: colostomy Neurologic Medical History: Positive for: Quadriplegia - C5-C7 1976 Immunizations Pneumococcal Vaccine: Received Pneumococcal Vaccination: No Implantable Devices None Family and Social History Former smoker; Marital Status - Single; Alcohol Use: Moderate; Drug Use: No History; Caffeine Use: Daily Electronic Signature(s) Signed: 12/18/2021 2:38:00 PM By: Gretta Cool, BSN, RN, CWS, Kim RN, BSN Signed: 12/18/2021 3:51:56 PM By: Worthy Keeler PA-C Entered By: Gretta Cool BSN, RN, CWS, Kim on 12/18/2021 12:26:00 [employer], [name] ([id number]) -------------------------------------------------------------------------------- [place] Details Patient Name: [name] Date of Service: 12/18/2021 Medical Record Number: [id number] Patient Account Number: [id number] Date of Birth/Sex: 22-Jan-1956 (65 y.o. M) Treating RN: Cornell Barman Primary Care Provider: Leward Quan Other Clinician: Referring Provider: Alfred Levins, [place] Treating Provider/Extender: Skipper Cliche in Treatment: 0 Diagnosis Coding ICD-10 Codes Code Description [phone] Pressure ulcer of sacral region, stage 3 G82.54 Quadriplegia, C5-C7 incomplete I48.0 Paroxysmal atrial fibrillation I95.89 Other hypotension Facility Procedures CPT4 Code: [id number] Description: 29518 - WOUND CARE VISIT-LEV 3 EST PT Modifier: Quantity: 1 Physician Procedures CPT4 Code: [id number] Description: WC PHYS LEVEL 3 o NEW PT Modifier: Quantity: 1 CPT4 Code: Description: ICD-10 Diagnosis Description L89.153 Pressure ulcer of sacral region, stage 3 G82.54 Quadriplegia, C5-C7  incomplete I48.0 Paroxysmal atrial fibrillation I95.89 Other hypotension Modifier: Quantity: Electronic Signature(s) Signed: 12/18/2021 2:32:42 PM By: Worthy Keeler PA-C Entered By: Worthy Keeler on 12/18/2021 14:32:42

## 2021-12-18 NOTE — Progress Notes (Signed)
[name], [name] ([id number]) Visit Report for 12/18/2021 Abuse Risk Screen Details Patient Name: [name], [name] Date of Service: 12/18/2021 12:00 PM Medical Record Number: [id number] Patient Account Number: [id number] Date of Birth/Sex: 08/14/1955 (66 y.o. M) Treating RN: Cornell Barman Primary Care Rossi Burdo: Leward Quan Other Clinician: Referring Giana Castner: Alfred Levins, [place] Treating Marquerite Forsman/Extender: Skipper Cliche in Treatment: 0 Abuse Risk Screen Items Answer ABUSE RISK SCREEN: Has anyone close to you tried to hurt or harm you recentlyo No Do you feel uncomfortable with anyone in your familyo No Has anyone forced you do things that you didnot want to doo No Electronic Signature(s) Signed: 12/18/2021 2:38:00 PM By: Gretta Cool, BSN, RN, CWS, Kim RN, BSN Entered By: Gretta Cool, BSN, RN, CWS, Kim on 12/18/2021 12:20:35 [name] ([id number]) -------------------------------------------------------------------------------- Activities of Daily Living Details Patient Name: [name] Date of Service: 12/18/2021 12:00 PM Medical Record Number: [id number] Patient Account Number: [id number] Date of Birth/Sex: [birth year] (65 y.o. M) Treating RN: Cornell Barman Primary Care Aleen Marston: Leward Quan Other Clinician: Referring Ambrosia Wisnewski: Alfred Levins, [place] Treating Lakyn Mantione/Extender: Skipper Cliche in Treatment: 0 Activities of Daily Living Items Answer Activities of Daily Living (Please select one for each item) Drive Automobile Not Able Take Medications Need Assistance Use Telephone Need Assistance Care for Appearance Need Assistance Use Toilet Need Assistance Bath / Shower Need Assistance Dress Self Need Assistance Feed Self Need Assistance Walk Not Able Get In / Out Bed Need Assistance Housework Need Assistance Prepare Meals Need Assistance Handle Money Need Assistance Shop for Self Need Assistance Electronic Signature(s) Signed: 12/18/2021 2:38:00 PM By: Gretta Cool, BSN, RN, CWS, Kim RN, BSN Entered  By: Gretta Cool, BSN, RN, CWS, Kim on 12/18/2021 12:21:11 [name] ([id number]) -------------------------------------------------------------------------------- Education Screening Details Patient Name: [name] Date of Service: 12/18/2021 12:00 PM Medical Record Number: [id number] Patient Account Number: [id number] Date of Birth/Sex: 07-24-1955 (65 y.o. M) Treating RN: Cornell Barman Primary Care Nadja Lina: Leward Quan Other Clinician: Referring Jazlynne Milliner: Alfred Levins, [place] Treating Ramari Bray/Extender: Skipper Cliche in Treatment: 0 Primary Learner Assessed: Patient Learning Preferences/Education Level/Primary Language Learning Preference: Explanation, Demonstration Preferred Language: English Cognitive Barrier Language Barrier: No Translator Needed: No Memory Deficit: No Emotional Barrier: No Physical Barrier Impaired Vision: No Impaired Hearing: No Decreased Hand dexterity: Yes Knowledge/Comprehension Knowledge Level: High Comprehension Level: High Ability to understand written instructions: High Ability to understand verbal instructions: High Motivation Anxiety Level: Calm Cooperation: Cooperative Education Importance: Acknowledges Need Interest in Health Problems: Asks Questions Perception: Coherent Willingness to Engage in Self-Management High Activities: Readiness to Engage in Self-Management High Activities: Engineer, maintenance) Signed: 12/18/2021 2:38:00 PM By: Gretta Cool, BSN, RN, CWS, Kim RN, BSN Entered By: Gretta Cool, BSN, RN, CWS, Kim on 12/18/2021 12:21:35 [name] ([id number]) -------------------------------------------------------------------------------- Fall Risk Assessment Details Patient Name: [name] Date of Service: 12/18/2021 12:00 PM Medical Record Number: [id number] Patient Account Number: [id number] Date of Birth/Sex: 08-May-1956 (65 y.o. M) Treating RN: Cornell Barman Primary Care Gregroy Dombkowski: Leward Quan Other Clinician: Referring Tomi Paddock:  Alfred Levins, [place] Treating Tannar Broker/Extender: Skipper Cliche in Treatment: 0 Fall Risk Assessment Items Have you had 2 or more falls in the last 12 monthso 0 No Have you had any fall that resulted in injury in the last 12 monthso 0 No FALLS RISK SCREEN History of falling - immediate or within 3 months 0 No Secondary diagnosis (Do you have 2 or more medical diagnoseso) 0 No Ambulatory aid None/bed rest/wheelchair/nurse 0 Yes Crutches/cane/walker 0 No Furniture 0 No Intravenous therapy Access/Saline/Heparin Lock 0 No Gait/Transferring Normal/ bed rest/ wheelchair 0 Yes Weak (short  steps with or without shuffle, stooped but able to lift head while walking, may 0 No seek support from furniture) Impaired (short steps with shuffle, may have difficulty arising from chair, head down, impaired 0 No balance) Mental Status Oriented to own ability 0 Yes Electronic Signature(s) Signed: 12/18/2021 2:38:00 PM By: Gretta Cool, BSN, RN, CWS, Kim RN, BSN Entered By: Gretta Cool, BSN, RN, CWS, Kim on 12/18/2021 12:21:48 [place], [name] ([id number]) -------------------------------------------------------------------------------- Foot Assessment Details Patient Name: [name] Date of Service: 12/18/2021 12:00 PM Medical Record Number: [id number] Patient Account Number: [id number] Date of Birth/Sex: [birth year] (65 y.o. M) Treating RN: Cornell Barman Primary Care Jermiya Reichl: Leward Quan Other Clinician: Referring William Laske: Alfred Levins, [place] Treating Malisha Mabey/Extender: Skipper Cliche in Treatment: 0 Foot Assessment Items Site Locations + = Sensation present, - = Sensation absent, C = Callus, U = Ulcer R = Redness, W = Warmth, M = Maceration, PU = Pre-ulcerative lesion F = Fissure, S = Swelling, D = Dryness Assessment Right: Left: Other Deformity: No No Prior Foot Ulcer: No No Prior Amputation: No No Charcot Joint: No No Ambulatory Status: Non-ambulatory Assistance Device:  Wheelchair Gait: Notes Government social research officer) Signed: 12/18/2021 2:38:00 PM By: Gretta Cool, BSN, RN, CWS, Kim RN, BSN Entered By: Gretta Cool, BSN, RN, CWS, Kim on 12/18/2021 12:22:41 [place], [name] ([id number]) -------------------------------------------------------------------------------- Nutrition Risk Screening Details Patient Name: [name] Date of Service: 12/18/2021 12:00 PM Medical Record Number: [id number] Patient Account Number: [id number] Date of Birth/Sex: [birth year] (65 y.o. M) Treating RN: Cornell Barman Primary Care Illias Pantano: Leward Quan Other Clinician: Referring Preslee Regas: Alfred Levins, [place] Treating Letta Cargile/Extender: Skipper Cliche in Treatment: 0 Height (in): 76 Weight (lbs): 167 Body Mass Index (BMI): 20.3 Nutrition Risk Screening Items Score Screening NUTRITION RISK SCREEN: I have an illness or condition that made me change the kind and/or amount of food I eat 0 No I eat fewer than two meals per day 0 No I eat few fruits and vegetables, or milk products 0 No I have three or more drinks of beer, liquor or wine almost every day 0 No I have tooth or mouth problems that make it hard for me to eat 0 No I don't always have enough money to buy the food I need 0 No I eat alone most of the time 0 No I take three or more different prescribed or over-the-counter drugs a day 0 No Without wanting to, I have lost or gained 10 pounds in the last six months 0 No I am not always physically able to shop, cook and/or feed myself 0 No Nutrition Protocols Good Risk Protocol 0 No interventions needed Moderate Risk Protocol High Risk Proctocol Risk Level: Good Risk Score: 0 Electronic Signature(s) Signed: 12/18/2021 2:38:00 PM By: Gretta Cool, BSN, RN, CWS, Kim RN, BSN Entered By: Gretta Cool, BSN, RN, CWS, Kim on 12/18/2021 12:22:04

## 2021-12-21 NOTE — Progress Notes (Signed)
12/22/21 12:24 PM   [name] [birth year] [id number]  Referring provider:  [facility] [address] [place],  [place] [address]  Chief Complaint  Patient presents with   Other    Urological history  1.  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 Left renal cysts -RUS 03/2021 - Stable left renal cysts   3. Elevated PSA -PSA 20.0 in 10/2021 - He is scheduled for a prostate biopsy in the OR with Dr Bernardo Heater   HPI: [name] is a 66 y.o.male who presents today fr pre-op urinalysis and urine culture.   He is successfully catheterized for specimen.  Prostate procedure is explained questions answered.  UA sent for culture.  PMH: No past medical history on file.  Surgical History: No past surgical history on file.  Home Medications:  Allergies as of 12/22/2021   No Known Allergies      Medication List        Accurate as of December 22, 2021 12:24 PM. If you have any questions, ask your nurse or doctor.          Cholecalciferol 25 MCG (1000 UT) tablet Take by mouth.   diltiazem 60 MG tablet Commonly known as: CARDIZEM Take by mouth.   docusate sodium 100 MG capsule Commonly known as: COLACE Take 100 mg by mouth daily as needed.   gabapentin 300 MG capsule Commonly known as: NEURONTIN Take 300 mg by mouth at bedtime.   midodrine 2.5 MG tablet Commonly known as: PROAMATINE Take 2.5 mg by mouth daily.   potassium chloride 10 MEQ tablet Commonly known as: KLOR-CON Take by mouth.        Allergies:  No Known Allergies  Family History: No family history on file.  Social History:  reports that he has never smoked. He has never used smokeless tobacco. He reports that he does not currently use alcohol. No history on file for drug use.   Physical Exam: BP (!) 82/56   Pulse 84   Ht '6\' 4"'$  (1.93 m)   Wt 167 lb  (75.8 kg)   BMI 20.33 kg/m   Constitutional:  Well nourished. Alert and oriented, No acute distress. HEENT: [place] AT, moist mucus membranes.  Trachea midline Cardiovascular: No clubbing, cyanosis, or edema. Respiratory: Normal respiratory effort, no increased work of breathing. GU: No CVA tenderness.  No bladder fullness or masses.  Patient with uncircumcised phallus. Foreskin easily retracted  Urethral meatus is patent.  No penile discharge. No penile lesions or rashes. Scrotum without lesions, cysts, rashes and/or edema.   Rectal: Patient with poor sphincter tone. Anus and perineum without scarring or rashes. No rectal masses are appreciated. Prostate is approximately 40 grams, flat, no nodules are appreciated. Seminal vesicles could not be palpated Neurologic: Grossly intact, no focal deficits, moving all 4 extremities. Psychiatric: Normal mood and affect.   Laboratory Data: Lab Results  Component Value Date   CREATININE 0.94 12/03/2021   Component     Latest Ref Rng 11/02/2021  Prostate Specific Ag, Serum     0.0 - 4.0 ng/mL 20.0 (H)     Legend: (H) High  Component     Latest Ref Rng 12/03/2021  Lactic Acid, Venous     0.5 - 1.9 mmol/L 1.1    Component     Latest Ref Rng 12/03/2021  Sodium     135 - 145 mmol/L 142  Potassium     3.5 - 5.1 mmol/L 3.3 (L)   Chloride     98 - 111 mmol/L 109   CO2     22 - 32 mmol/L 26   Glucose     70 - 99 mg/dL 117 (H)   BUN     8 - 23 mg/dL 24 (H)   Creatinine     0.61 - 1.24 mg/dL 0.94   Calcium     8.9 - 10.3 mg/dL 9.0   Total Protein     6.5 - 8.1 g/dL 7.0   Albumin     3.5 - 5.0 g/dL 3.6   AST     15 - 41 U/L 17   ALT     0 - 44 U/L 17   Alkaline Phosphatase     38 - 126 U/L 95   Total Bilirubin     0.3 - 1.2 mg/dL 0.8   GFR, Estimated     >60 mL/min >60   Anion gap     5 - 15  7     Legend: (L) Low (H) High  Component     Latest Ref Rng 12/03/2021  WBC, UA     0 - 5 /hpf   RBC     4.22 - 5.81 MIL/uL 4.91    Epithelial Cells (non renal)     0 - 10 /hpf   Renal Epithel, UA     None seen /hpf   Bacteria, UA     None seen/Few    WBC     4.0 - 10.5 K/uL 7.2   Hemoglobin     13.0 - 17.0 g/dL 14.1   HCT     39.0 - 52.0 % 44.4   MCV     80.0 - 100.0 fL 90.4   MCH     26.0 - 34.0 pg 28.7   MCHC     30.0 - 36.0 g/dL 31.8   RDW     11.5 - 15.5 % 12.7   Platelets     150 - 400 K/uL 180   nRBC     0.0 - 0.2 % 0.0   Neutrophils     % 75   NEUT#     1.7 - 7.7 K/uL 5.3   Lymphocytes     % 16   Lymphocyte #     0.7 - 4.0 K/uL 1.1   Monocytes Relative     % 8   Monocyte #     0.1 - 1.0 K/uL 0.6   Eosinophil     % 1   Eosinophils Absolute     0.0 - 0.5 K/uL 0.1   Basophil     % 0   Basophils Absolute     0.0 - 0.1 K/uL 0.0   Immature Granulocytes     % 0   Abs Immature Granulocytes     0.00 - 0.07 K/uL 0.01   Urinalysis CATH UA clear I have reviewed the labs.   Pertinent Imaging: CLINICAL DATA:  Elevated PSA level 20.0   EXAM: MR PROSTATE WITHOUT AND WITH CONTRAST   TECHNIQUE: Multiplanar multisequence MRI images were obtained of the pelvis centered about the prostate. Pre and post contrast images were obtained.   CONTRAST:  58m GADAVIST GADOBUTROL 1 MMOL/ML IV SOLN   COMPARISON:  None Available.   FINDINGS: Timing error resulted in contrast injection prior to precontrast images. As result, subtraction images should be disregarded.   Prostate:  Region of interest # 1: PI-RADS category 5 lesion of the entire left peripheral zone at the base and mid gland, and the left posterolateral peripheral zone at the apex, with extensive reduced T2 signal (image 28, series 10), reduced ADC map activity (image 10, series 8), restricted diffusion (image 10, series 9), and focal early enhancement (image 11, series 11). This measures 5.43 cc (3.8 by 1.6 by 2.0 cm) and has substantial capsular contact placing it at risk of transcapsular spread.   Region of interest # 2:  PI-RADS category 5 lesion of the left anterior transition zone in the mid gland and base, right anterior transition zone at the base, and bilateral anterior fibromuscular stroma extending from the apex through the base, with indistinct reduced T2 signal (image 30, series 10), reduced ADC map activity and restricted diffusion (image 11 of series 8 and 9), and diffuse early enhancement (image 13, series 11). This measures 2.59 cc (1.9 by 1.0 by 1.9 cm).   Region of interest # 3: PI-RADS category 5 lesion of the right anterior transition zone and right anterior fibromuscular stroma in the mid gland with reduced T2 signal, reduced ADC map activity and restricted diffusion (image 12 of series 8 and 9), and early enhancement (image 14, series 11). This measures 0.82 cc (1.6 by 1.0 by 0.7 cm).   Volume: 3D volumetric analysis: Prostate volume 28.96 cc (5.3 by 3.5 by 3.8 cm).   Transcapsular spread: Likely present especially along region of interest # 1 given the amount of capsular contact.   Seminal vesicle involvement: Not observed   Neurovascular bundle involvement: Not observed   Pelvic adenopathy: Not observed   Bone metastasis: Absent   Other findings: Cystic lesion below the expected location of the left kidney, probably a renal cyst, not completely assessed.   IMPRESSION: 1. 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 Region of interest # 1 has a large amount of capsular contact raising the risk of transcapsular spread. Targeting data sent to [place].     Electronically Signed   By: Van Clines M.D.   On: 11/23/2021 08:26   In and Out Catheterization Patient is present today for a I & O catheterization due to upcoming procedure. Patient was cleaned and prepped in a sterile fashion with betadine . A 14 FR cath was inserted no complications were noted , 350 ml of urine return was noted, urine was yellow dark in  color. A clean urine sample was collected for UA and urine culture. Bladder was drained  And catheter was removed with out difficulty.    Performed by: Zara Council, PA-C   Assessment & Plan:    1. 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  The procedure is explained and the risks involved, such as blood in urine, blood in stool, blood in semen, infection, urinary retention, and on rare occasions sepsis and death.  Patient understands the risks as explained to him and he wishes to proceed.   -CATH UA is clear -urine sent for culture -If urine culture is positive, UTI needs to be treated and patient is to be maintained on a low-dose antibiotic until the time of the biopsy  Return for TRUSPBX of prostate in OR .  Billie Intriago  Marry Guan   [facility] [address] [place], [place] [address] [phone]  I spent 30 minutes on the day of the encounter to include pre-visit record review, face-to-face time with the patient, and post-visit ordering of tests.

## 2021-12-22 ENCOUNTER — Ambulatory Visit (INDEPENDENT_AMBULATORY_CARE_PROVIDER_SITE_OTHER): Payer: Medicare (Managed Care) | Admitting: Urology

## 2021-12-22 ENCOUNTER — Other Ambulatory Visit: Payer: Self-pay | Admitting: Urology

## 2021-12-22 ENCOUNTER — Encounter: Payer: Self-pay | Admitting: Urology

## 2021-12-22 VITALS — BP 82/56 | HR 84 | Ht 76.0 in | Wt 167.0 lb

## 2021-12-22 DIAGNOSIS — R972 Elevated prostate specific antigen [PSA]: Secondary | ICD-10-CM

## 2021-12-22 DIAGNOSIS — R9389 Abnormal findings on diagnostic imaging of other specified body structures: Secondary | ICD-10-CM

## 2021-12-22 DIAGNOSIS — R3989 Other symptoms and signs involving the genitourinary system: Secondary | ICD-10-CM | POA: Diagnosis not present

## 2021-12-22 LAB — URINALYSIS, COMPLETE
Bilirubin, UA: NEGATIVE
Glucose, UA: NEGATIVE
Ketones, UA: NEGATIVE
Leukocytes,UA: NEGATIVE
Nitrite, UA: NEGATIVE
Protein,UA: NEGATIVE
RBC, UA: NEGATIVE
Specific Gravity, UA: 1.025 (ref 1.005–1.030)
Urobilinogen, Ur: 0.2 mg/dL (ref 0.2–1.0)
pH, UA: 5.5 (ref 5.0–7.5)

## 2021-12-22 LAB — MICROSCOPIC EXAMINATION: Bacteria, UA: NONE SEEN

## 2021-12-22 NOTE — Progress Notes (Signed)
Surgical Physician Order Form [facility] [place]  * Scheduling expectation :  3 to 4 weeks with [name] - urine sent for culture today (12/22/2021)  *Length of Case:   *Clearance needed: yes, patient on midodrine for hypotension  *Anticoagulation Instructions: Hold all anticoagulants  *Aspirin Instructions: Hold Aspirin  *Post-op visit Date/Instructions:   one week follow up to go over biopsy results   *Diagnosis:  Elevated PSA with PI-RADS 5 lesions x 3 on prostate MRI  *Procedure:  N/A   transrectal ultrasound guided prostate biopsy    Additional orders: N/A  -Admit type: OUTpatient  -Anesthesia: General  -VTE Prophylaxis Standing Order SCD's       Other:   -Standing Lab Orders Per Anesthesia    Lab other: None  -Standing Test orders EKG/Chest x-ray per Anesthesia       Test other:   - Medications:  Gentamicin per pharmacy and Levaquin 500 mg IV  -Other orders:   Urine culture has to be resulted and if positive, UTI treated and then maintain on low dose until biopsy

## 2021-12-26 LAB — CULTURE, URINE COMPREHENSIVE

## 2022-01-01 ENCOUNTER — Encounter: Payer: Medicare (Managed Care) | Admitting: Physician Assistant

## 2022-01-01 DIAGNOSIS — L89153 Pressure ulcer of sacral region, stage 3: Secondary | ICD-10-CM | POA: Diagnosis not present

## 2022-01-01 NOTE — Progress Notes (Signed)
[name], [name] ([id number]) Visit Report for 01/01/2022 Chief Complaint Document Details Patient Name: [name], [name] Date of Service: 01/01/2022 11:15 AM Medical Record Number: [id number] Patient Account Number: [id number] Date of Birth/Sex: [birth year] (66 y.o. M) Treating RN: Levora Dredge Primary Care Provider: Leward Quan Other Clinician: Massie Kluver Referring Provider: Leward Quan Treating Provider/Extender: Skipper Cliche in Treatment: 2 Information Obtained from: Patient Chief Complaint Sacral pressure ulcer Electronic Signature(s) Signed: 01/01/2022 12:05:13 PM By: Worthy Keeler PA-C Entered By: Worthy Keeler on 01/01/2022 12:05:13 [facility], [name] ([id number]) -------------------------------------------------------------------------------- Physician Orders Details Patient Name: [name] Date of Service: 01/01/2022 11:15 AM Medical Record Number: [id number] Patient Account Number: [id number] Date of Birth/Sex: [birth year] (65 y.o. M) Treating RN: Levora Dredge Primary Care Provider: Leward Quan Other Clinician: Massie Kluver Referring Provider: Leward Quan Treating Provider/Extender: Skipper Cliche in Treatment: 2 Verbal / Phone Orders: No Diagnosis Coding ICD-10 Coding Code Description L89.153 Pressure ulcer of sacral region, stage 3 G82.54 Quadriplegia, C5-C7 incomplete I48.0 Paroxysmal atrial fibrillation I95.89 Other hypotension Follow-up Appointments Wound #1 Midline Sacrum o Return Appointment in 2 weeks. Home Health Wound #1 [place]: - [facility] Bathing/ Shower/ Hygiene o May shower; gently cleanse wound with antibacterial soap, rinse and pat dry prior to dressing wounds Off-Loading o Roho cushion for wheelchair Additional Orders / Instructions o Follow Nutritious Diet and Increase Protein Intake Wound Treatment Wound #1 - Sacrum Wound Laterality: Midline Cleanser: Soap and Water Discharge Instructions:  Gently cleanse wound with antibacterial soap, rinse and pat dry prior to dressing wounds Primary Dressing: Silvercel Small 2x2 (in/in) Discharge Instructions: Apply Silvercel Small 2x2 (in/in) as instructed Secondary Dressing: (SILICONE BORDER) Zetuvit Plus SILICONE BORDER Dressing 4x4 (in/in) Discharge Instructions: Please do not put silicone bordered dressings under wraps. Use non-bordered dressing only. Electronic Signature(s) Unsigned Entered By: Massie Kluver on 01/01/2022 12:09:06 Signature(s): Date(s): [name], [name] ([id number]) -------------------------------------------------------------------------------- Problem List Details Patient Name: [name], [name] Date of Service: 01/01/2022 11:15 AM Medical Record Number: [id number] Patient Account Number: [id number] Date of Birth/Sex: July 23, 1955 (66 y.o. M) Treating RN: Levora Dredge Primary Care Provider: Leward Quan Other Clinician: Massie Kluver Referring Provider: Leward Quan Treating Provider/Extender: Skipper Cliche in Treatment: 2 Active Problems ICD-10 Encounter Code Description Active Date MDM Diagnosis L89.153 Pressure ulcer of sacral region, stage 3 12/18/2021 No Yes G82.54 Quadriplegia, C5-C7 incomplete 12/18/2021 No Yes I48.0 Paroxysmal atrial fibrillation 12/18/2021 No Yes I95.89 Other hypotension 12/18/2021 No Yes Inactive Problems Resolved Problems Electronic Signature(s) Signed: 01/01/2022 12:05:10 PM By: Worthy Keeler PA-C Entered By: Worthy Keeler on 01/01/2022 12:05:10

## 2022-01-02 ENCOUNTER — Other Ambulatory Visit: Payer: Self-pay | Admitting: Urology

## 2022-01-02 DIAGNOSIS — R972 Elevated prostate specific antigen [PSA]: Secondary | ICD-10-CM

## 2022-01-03 ENCOUNTER — Telehealth: Payer: Self-pay | Admitting: *Deleted

## 2022-01-03 ENCOUNTER — Telehealth: Payer: Self-pay | Admitting: Urgent Care

## 2022-01-03 ENCOUNTER — Telehealth: Payer: Self-pay

## 2022-01-03 NOTE — Telephone Encounter (Signed)
[name] would like to talk to you about [name]  [phone] cell [place]

## 2022-01-03 NOTE — Telephone Encounter (Signed)
I spoke with [name] and [facility]. We have discussed possible surgery dates and Tuesday August 8th, 2023 was agreed upon by all parties. Patient given information about surgery date, what to expect pre-operatively and post operatively.  We discussed that a Pre-Admission Testing office will be calling to set up the pre-op visit that will take place prior to surgery, and that these appointments are typically done over the phone with a Pre-Admissions RN.  Informed patient that our office will communicate any additional care to be provided after surgery. Patients questions or concerns were discussed during our call. Advised to call our office should there be any additional information, questions or concerns that arise. Patient verbalized understanding.

## 2022-01-03 NOTE — Progress Notes (Signed)
[facility] Posting Form   Surgery Date/Time: Date: 01/23/2022  Surgeon: Dr. John Giovanni, MD  Surgery Location: Day Surgery  Inpt ( No  )   Outpt (Yes)   Obs ( No  )   Diagnosis: Elevated Prostate Specific Antigen R97.20  -CPT: 48250, 55700, 64450, [phone]  Surgery: Transrectal Ultrasound guided Prostate Biopsy   Stop Anticoagulations: Yes and also hold ASA  Cardiac/Medical/Pulmonary Clearance needed: yes  Clearance needed from Dr: Lutricia Feil, [name]  Clearance request sent on: Date: 01/03/22  *Orders entered into EPIC  Date: 01/03/22   *Case booked in EPIC  Date: 01/02/2022  *Notified pt of Surgery: Date: 01/02/2022  PRE-OP UA & CX: yes, obtained last week in clinic  *Placed into Prior Authorization Work Fabio Bering Date: 01/03/22   Assistant/laser/rep: Coordinated with U/S

## 2022-01-03 NOTE — Progress Notes (Signed)
  Perioperative Services Pre-Admission/Anesthesia Testing     Date: 01/03/22  Name: [name] MRN:   [id number]  Re: Request from surgery for clearance prior to scheduled procedure  Patient is scheduled to undergo a TRUS PROSTATE BIOPSY on 01/23/2022 with Dr. John Giovanni, MD. Patient has not been scheduled for his PAT appointment at this point, thus has not undergone review by PAT RN and/or APP. Received communication from primary attending surgeon's office requesting that patient be submitted for clearance from CARDIOLOGY.   PROVIDER SPECIALTY FAXED TO   Benjiman Core, MD  Cardiology  [phone]   Plan:  Clearance documents generated and faxed to appropriate provider(s) as noted above. Note will be updated to reflect communication with provider's office as it relates to clearance being provided and/or the need for office visit prior to clearance for surgery being issued.   Honor Loh, MSN, APRN, FNP-C, CEN [facility]  Peri-operative Services Nurse Practitioner Phone: [phone] 01/03/22 11:14 AM  NOTE: This note has been prepared using Dragon dictation software. Despite my best ability to proofread, there is always the potential that unintentional transcriptional errors may still occur from this process.

## 2022-01-03 NOTE — Progress Notes (Signed)
LONG, BRIMAGE ([id number]) Visit Report for 01/01/2022 Arrival Information Details Patient Name: [name], [name] Date of Service: 01/01/2022 11:15 AM Medical Record Number: [id number] Patient Account Number: [id number] Date of Birth/Sex: 03-18-1956 (66 y.o. M) Treating RN: Levora Dredge Primary Care Davina Howlett: Leward Quan Other Clinician: Massie Kluver Referring Deavion Strider: Leward Quan Treating Chrishawn Kring/Extender: Skipper Cliche in Treatment: 2 Visit Information History Since Last Visit All ordered tests and consults were completed: No Patient Arrived: Wheel Chair Added or deleted any medications: No Arrival Time: 11:42 Any new allergies or adverse reactions: No Transfer Assistance: Transfer Board Had a fall or experienced change in No Patient Has Alerts: Yes activities of daily living that may affect Patient Alerts: NOT DIABETIC risk of falls: Hospitalized since last visit: No Pain Present Now: No Electronic Signature(s) Signed: 01/03/2022 8:34:35 AM By: Massie Kluver Entered By: Massie Kluver on 01/01/2022 11:43:18 [facility], Kaydyn ([id number]) -------------------------------------------------------------------------------- Clinic Level of Care Assessment Details Patient Name: [name] Date of Service: 01/01/2022 11:15 AM Medical Record Number: [id number] Patient Account Number: [id number] Date of Birth/Sex: December 02, 1955 (65 y.o. M) Treating RN: Levora Dredge Primary Care Porter Moes: Leward Quan Other Clinician: Massie Kluver Referring Carlyn Lemke: Leward Quan Treating Mardene Lessig/Extender: Skipper Cliche in Treatment: 2 Clinic Level of Care Assessment Items TOOL 4 Quantity Score '[]'$  - Use when only an EandM is performed on FOLLOW-UP visit 0 ASSESSMENTS - Nursing Assessment / Reassessment X - Reassessment of Co-morbidities (includes updates in patient status) 1 10 X- 1 5 Reassessment of Adherence to Treatment Plan ASSESSMENTS - Wound and Skin Assessment / Reassessment X -  Simple Wound Assessment / Reassessment - one wound 1 5 '[]'$  - 0 Complex Wound Assessment / Reassessment - multiple wounds '[]'$  - 0 Dermatologic / Skin Assessment (not related to wound area) ASSESSMENTS - Focused Assessment '[]'$  - Circumferential Edema Measurements - multi extremities 0 '[]'$  - 0 Nutritional Assessment / Counseling / Intervention '[]'$  - 0 Lower Extremity Assessment (monofilament, tuning fork, pulses) '[]'$  - 0 Peripheral Arterial Disease Assessment (using hand held doppler) ASSESSMENTS - Ostomy and/or Continence Assessment and Care '[]'$  - Incontinence Assessment and Management 0 '[]'$  - 0 Ostomy Care Assessment and Management (repouching, etc.) PROCESS - Coordination of Care X - Simple Patient / Family Education for ongoing care 1 15 '[]'$  - 0 Complex (extensive) Patient / Family Education for ongoing care X- 1 10 Staff obtains Consents, Records, Test Results / Process Orders '[]'$  - 0 Staff telephones HHA, Nursing Homes / Clarify orders / etc '[]'$  - 0 Routine Transfer to another Facility (non-emergent condition) '[]'$  - 0 Routine Hospital Admission (non-emergent condition) '[]'$  - 0 New Admissions / Biomedical engineer / Ordering NPWT, Apligraf, etc. '[]'$  - 0 Emergency Hospital Admission (emergent condition) X- 1 10 Simple Discharge Coordination '[]'$  - 0 Complex (extensive) Discharge Coordination PROCESS - Special Needs '[]'$  - Pediatric / Minor Patient Management 0 '[]'$  - 0 Isolation Patient Management '[]'$  - 0 Hearing / Language / Visual special needs '[]'$  - 0 Assessment of Community assistance (transportation, D/C planning, etc.) '[]'$  - 0 Additional assistance / Altered mentation '[]'$  - 0 Support Surface(s) Assessment (bed, cushion, seat, etc.) INTERVENTIONS - Wound Cleansing / Measurement [name], [name] ([id number]) X- 1 5 Simple Wound Cleansing - one wound '[]'$  - 0 Complex Wound Cleansing - multiple wounds X- 1 5 Wound Imaging (photographs - any number of wounds) '[]'$  - 0 Wound Tracing  (instead of photographs) X- 1 5 Simple Wound Measurement - one wound '[]'$  - 0 Complex Wound Measurement - multiple wounds INTERVENTIONS -  Wound Dressings '[]'$  - Small Wound Dressing one or multiple wounds 0 X- 1 15 Medium Wound Dressing one or multiple wounds '[]'$  - 0 Large Wound Dressing one or multiple wounds '[]'$  - 0 Application of Medications - topical '[]'$  - 0 Application of Medications - injection INTERVENTIONS - Miscellaneous '[]'$  - External ear exam 0 '[]'$  - 0 Specimen Collection (cultures, biopsies, blood, body fluids, etc.) '[]'$  - 0 Specimen(s) / Culture(s) sent or taken to Lab for analysis X- 1 10 Patient Transfer (multiple staff / [name] Lift / Similar devices) '[]'$  - 0 Simple Staple / Suture removal (25 or less) '[]'$  - 0 Complex Staple / Suture removal (26 or more) '[]'$  - 0 Hypo / Hyperglycemic Management (close monitor of Blood Glucose) '[]'$  - 0 Ankle / Brachial Index (ABI) - do not check if billed separately X- 1 5 Vital Signs Has the patient been seen at the hospital within the last three years: Yes Total Score: 100 Level Of Care: New/Established - Level 3 Electronic Signature(s) Signed: 01/03/2022 8:34:35 AM By: Massie Kluver Entered By: Massie Kluver on 01/01/2022 12:09:57 [facility], [name] ([id number]) -------------------------------------------------------------------------------- Encounter Discharge Information Details Patient Name: [name] Date of Service: 01/01/2022 11:15 AM Medical Record Number: [id number] Patient Account Number: [id number] Date of Birth/Sex: Dec 28, 1955 (66 y.o. M) Treating RN: Levora Dredge Primary Care Babe Clenney: Leward Quan Other Clinician: Massie Kluver Referring Patsy Varma: Leward Quan Treating Carren Blakley/Extender: Skipper Cliche in Treatment: 2 Encounter Discharge Information Items Discharge Condition: Stable Ambulatory Status: Wheelchair Discharge Destination: Skilled Nursing Facility Telephoned: Yes Transportation: Other Accompanied  By: aid Schedule Follow-up Appointment: Yes Clinical Summary of Care: Electronic Signature(s) Signed: 01/03/2022 8:34:35 AM By: Massie Kluver Entered By: Massie Kluver on 01/01/2022 12:23:13 [facility], [name] ([id number]) -------------------------------------------------------------------------------- Lower Extremity Assessment Details Patient Name: [name] Date of Service: 01/01/2022 11:15 AM Medical Record Number: [id number] Patient Account Number: [id number] Date of Birth/Sex: [birth year] (65 y.o. M) Treating RN: Levora Dredge Primary Care Logann Whitebread: Leward Quan Other Clinician: Massie Kluver Referring Kemonie Cutillo: Leward Quan Treating Tona Qualley/Extender: Jeri Cos Weeks in Treatment: 2 Electronic Signature(s) Signed: 01/01/2022 4:46:59 PM By: Levora Dredge Signed: 01/03/2022 8:34:35 AM By: Massie Kluver Entered By: Massie Kluver on 01/01/2022 11:55:59 [facility], [name] ([id number]) -------------------------------------------------------------------------------- Multi Wound Chart Details Patient Name: [name] Date of Service: 01/01/2022 11:15 AM Medical Record Number: [id number] Patient Account Number: [id number] Date of Birth/Sex: [birth year] (65 y.o. M) Treating RN: Levora Dredge Primary Care Kamarius Buckbee: Leward Quan Other Clinician: Massie Kluver Referring Reeshemah Nazaryan: Leward Quan Treating Lakya Schrupp/Extender: Skipper Cliche in Treatment: 2 Vital Signs Height(in): 76 Pulse(bpm): 71 Weight(lbs): 167 Blood Pressure(mmHg): 106/64 Body Mass Index(BMI): 20.3 Temperature(F): 98.1 Respiratory Rate(breaths/min): 16 Photos: [1:No Photos] [N/A:N/A] Wound Location: [1:Midline Sacrum] [N/A:N/A] Wounding Event: [1:Pressure Injury] [N/A:N/A] Primary Etiology: [1:Pressure Ulcer] [N/A:N/A] Comorbid History: [1:Arrhythmia, Hypotension, History of N/A pressure wounds, Quadriplegia] Date Acquired: [1:10/18/2021] [N/A:N/A] Weeks of Treatment: [1:2] [N/A:N/A] Wound Status: [1:Open]  [N/A:N/A] Wound Recurrence: [1:No] [N/A:N/A] Measurements L x W x D (cm) [1:4x0.5x0.1] [N/A:N/A] Area (cm) : [1:1.571] [N/A:N/A] Volume (cm) : [1:0.157] [N/A:N/A] % Reduction in Area: [1:32.00%] [N/A:N/A] % Reduction in Volume: [1:66.00%] [N/A:N/A] Classification: [1:Category/Stage III] [N/A:N/A] Exudate Amount: [1:Medium] [N/A:N/A] Exudate Type: [1:Serous] [N/A:N/A] Exudate Color: [1:amber] [N/A:N/A] Wound Margin: [1:Flat and Intact] [N/A:N/A] Granulation Amount: [1:Medium (34-66%)] [N/A:N/A] Granulation Quality: [1:Red] [N/A:N/A] Necrotic Amount: [1:Medium (34-66%)] [N/A:N/A] Exposed Structures: [1:Fat Layer (Subcutaneous Tissue): N/A Yes Fascia: No Tendon: No Muscle: No Joint: No Bone: No None] [N/A:N/A] Treatment Notes Electronic Signature(s) Signed: 01/03/2022 8:34:35 AM By: Massie Kluver Entered By: Clifton James,  Angie on 01/01/2022 11:56:14 [name], [name] ([id number]) -------------------------------------------------------------------------------- [place] Details Patient Name: [name], [name] Date of Service: 01/01/2022 11:15 AM Medical Record Number: [id number] Patient Account Number: [id number] Date of Birth/Sex: 09-Feb-1956 (66 y.o. M) Treating RN: Levora Dredge Primary Care Allissa Albright: Leward Quan Other Clinician: Massie Kluver Referring Kimyetta Flott: Leward Quan Treating Morrison Mcbryar/Extender: Skipper Cliche in Treatment: 2 Active Inactive Necrotic Tissue Nursing Diagnoses: Impaired tissue integrity related to necrotic/devitalized tissue Knowledge deficit related to management of necrotic/devitalized tissue Goals: Necrotic/devitalized tissue will be minimized in the wound bed Date Initiated: 12/18/2021 Target Resolution Date: 01/01/2022 Goal Status: Active Patient/caregiver will verbalize understanding of reason and process for debridement of necrotic tissue Date Initiated: 12/18/2021 Target Resolution Date: 01/01/2022 Goal Status:  Active Interventions: Assess patient pain level pre-, during and post procedure and prior to discharge Provide education on necrotic tissue and debridement process Treatment Activities: Apply topical anesthetic as ordered : 12/18/2021 Notes: Orientation to the Wound Care Program Nursing Diagnoses: Knowledge deficit related to the wound healing center program Goals: Patient/caregiver will verbalize understanding of the [place] Date Initiated: 12/18/2021 Target Resolution Date: 01/01/2022 Goal Status: Active Interventions: Provide education on orientation to the wound center Notes: Pressure Nursing Diagnoses: Knowledge deficit related to management of pressures ulcers Goals: Patient will remain free from development of additional pressure ulcers Date Initiated: 12/18/2021 Target Resolution Date: 12/18/2021 Goal Status: Active Patient will remain free of pressure ulcers Date Initiated: 12/18/2021 Target Resolution Date: 12/18/2021 Goal Status: Active Patient/caregiver will verbalize risk factors for pressure ulcer development Date Initiated: 12/18/2021 Target Resolution Date: 12/18/2021 Goal Status: Active Patient/caregiver will verbalize understanding of pressure ulcer management [name], [name] ([id number]) Date Initiated: 12/18/2021 Target Resolution Date: 12/18/2021 Goal Status: Active Interventions: Assess: immobility, friction, shearing, incontinence upon admission and as needed Assess offloading mechanisms upon admission and as needed Assess potential for pressure ulcer upon admission and as needed Provide education on pressure ulcers Treatment Activities: Patient referred for seating evaluation to ensure proper offloading : 12/18/2021 Notes: Wound/Skin Impairment Nursing Diagnoses: Impaired tissue integrity Knowledge deficit related to smoking impact on wound healing Knowledge deficit related to ulceration/compromised skin integrity Goals: Patient/caregiver will  verbalize understanding of skin care regimen Date Initiated: 12/18/2021 Target Resolution Date: 12/18/2021 Goal Status: Active Ulcer/skin breakdown will have a volume reduction of 30% by week 4 Date Initiated: 12/18/2021 Target Resolution Date: 01/15/2022 Goal Status: Active Interventions: Assess ulceration(s) every visit Treatment Activities: Referred to DME Jazz Biddy for dressing supplies : 12/18/2021 Skin care regimen initiated : 12/18/2021 Topical wound management initiated : 12/18/2021 Notes: Electronic Signature(s) Signed: 01/01/2022 4:46:59 PM By: Levora Dredge Signed: 01/03/2022 8:34:35 AM By: Massie Kluver Entered By: Massie Kluver on 01/01/2022 11:56:05 [facility], [name] ([id number]) -------------------------------------------------------------------------------- Pain Assessment Details Patient Name: [name] Date of Service: 01/01/2022 11:15 AM Medical Record Number: [id number] Patient Account Number: [id number] Date of Birth/Sex: [birth year] (66 y.o. M) Treating RN: Levora Dredge Primary Care Chaseton Yepiz: Leward Quan Other Clinician: Massie Kluver Referring Radhika Dershem: Leward Quan Treating Sava Proby/Extender: Skipper Cliche in Treatment: 2 Active Problems Location of Pain Severity and Description of Pain Patient Has Paino No Site Locations Pain Management and Medication Current Pain Management: Electronic Signature(s) Signed: 01/01/2022 4:46:59 PM By: Levora Dredge Signed: 01/03/2022 8:34:35 AM By: Massie Kluver Entered By: Massie Kluver on 01/01/2022 11:45:48 [facility], [name] ([id number]) -------------------------------------------------------------------------------- Patient/Caregiver Education Details Patient Name: [name] Date of Service: 01/01/2022 11:15 AM Medical Record Number: [id number] Patient Account Number: [id number] Date of Birth/Gender: 1955-11-23 (65 y.o. M) Treating RN: Levora Dredge  Primary Care Physician: Leward Quan Other Clinician: Massie Kluver Referring Physician: Leward Quan Treating Physician/Extender: Skipper Cliche in Treatment: 2 Education Assessment Education Provided To: Patient Education Topics Provided Pressure: Handouts: Pressure Ulcers: Care and Offloading Electronic Signature(s) Signed: 01/03/2022 8:34:35 AM By: Massie Kluver Entered By: Massie Kluver on 01/01/2022 12:10:24 [facility], [name] ([id number]) -------------------------------------------------------------------------------- Wound Assessment Details Patient Name: [name] Date of Service: 01/01/2022 11:15 AM Medical Record Number: [id number] Patient Account Number: [id number] Date of Birth/Sex: 04-13-1956 (65 y.o. M) Treating RN: Levora Dredge Primary Care Hadar Elgersma: Leward Quan Other Clinician: Massie Kluver Referring Aureliano Oshields: Leward Quan Treating Rudine Rieger/Extender: Skipper Cliche in Treatment: 2 Wound Status Wound Number: 1 Primary Pressure Ulcer Etiology: Wound Location: Midline Sacrum Wound Status: Open Wounding Event: Pressure Injury Comorbid Arrhythmia, Hypotension, History of pressure wounds, Date Acquired: 10/18/2021 History: Quadriplegia Weeks Of Treatment: 2 Clustered Wound: No Photos Wound Measurements Length: (cm) 4 Width: (cm) 0.5 Depth: (cm) 0.1 Area: (cm) 1.571 Volume: (cm) 0.157 % Reduction in Area: 32% % Reduction in Volume: 66% Epithelialization: None Wound Description Classification: Category/Stage III Wound Margin: Flat and Intact Exudate Amount: Medium Exudate Type: Serous Exudate Color: amber Foul Odor After Cleansing: No Slough/Fibrino Yes Wound Bed Granulation Amount: Medium (34-66%) Exposed Structure Granulation Quality: Red Fascia Exposed: No Necrotic Amount: Medium (34-66%) Fat Layer (Subcutaneous Tissue) Exposed: Yes Necrotic Quality: Adherent Slough Tendon Exposed: No Muscle Exposed: No Joint Exposed: No Bone Exposed: No Treatment Notes Wound #1 (Sacrum) Wound Laterality:  Midline Cleanser Soap and Water Discharge Instruction: Gently cleanse wound with antibacterial soap, rinse and pat dry prior to dressing wounds Peri-Wound Care [facility], [name] ([id number]) Topical Primary Dressing Silvercel Small 2x2 (in/in) Discharge Instruction: Apply Silvercel Small 2x2 (in/in) as instructed Secondary Dressing (SILICONE BORDER) Zetuvit Plus SILICONE BORDER Dressing 4x4 (in/in) Discharge Instruction: Please do not put silicone bordered dressings under wraps. Use non-bordered dressing only. Secured With Compression Wrap Compression Stockings Add-Ons Electronic Signature(s) Signed: 01/01/2022 4:46:59 PM By: Levora Dredge Signed: 01/03/2022 8:34:35 AM By: Massie Kluver Entered By: Massie Kluver on 01/01/2022 11:58:38 [facility], [name] ([id number]) -------------------------------------------------------------------------------- Vitals Details Patient Name: [name] Date of Service: 01/01/2022 11:15 AM Medical Record Number: [id number] Patient Account Number: [id number] Date of Birth/Sex: [birth year] (65 y.o. M) Treating RN: Levora Dredge Primary Care Shaylinn Hladik: Leward Quan Other Clinician: Massie Kluver Referring Merriam Brandner: Leward Quan Treating Cylas Falzone/Extender: Skipper Cliche in Treatment: 2 Vital Signs Time Taken: 11:43 Temperature (F): 98.1 Height (in): 76 Pulse (bpm): 71 Weight (lbs): 167 Respiratory Rate (breaths/min): 16 Body Mass Index (BMI): 20.3 Blood Pressure (mmHg): 106/64 Reference Range: 80 - 120 mg / dl Electronic Signature(s) Signed: 01/03/2022 8:34:35 AM By: Massie Kluver Entered By: Massie Kluver on 01/01/2022 11:45:41

## 2022-01-05 ENCOUNTER — Other Ambulatory Visit: Payer: Self-pay | Admitting: Urology

## 2022-01-05 DIAGNOSIS — C61 Malignant neoplasm of prostate: Secondary | ICD-10-CM

## 2022-01-05 DIAGNOSIS — R972 Elevated prostate specific antigen [PSA]: Secondary | ICD-10-CM

## 2022-01-05 NOTE — Progress Notes (Signed)
Spoke with [name] regarding her concerns for patient undergoing a prostate biopsy with his issues with low blood pressure and also high risk of sepsis due to his comorbidities.  At this time we will pursue a PSMA PET scan to evaluate for possible metastases.  Once the PET scan is approved and images obtained we will devise a treatment plan.

## 2022-01-11 ENCOUNTER — Other Ambulatory Visit (HOSPITAL_COMMUNITY): Payer: Self-pay | Admitting: Urology

## 2022-01-11 ENCOUNTER — Other Ambulatory Visit (HOSPITAL_COMMUNITY): Payer: Self-pay | Admitting: Family Medicine

## 2022-01-11 ENCOUNTER — Other Ambulatory Visit: Payer: Self-pay | Admitting: Family Medicine

## 2022-01-11 DIAGNOSIS — R972 Elevated prostate specific antigen [PSA]: Secondary | ICD-10-CM

## 2022-01-11 DIAGNOSIS — C61 Malignant neoplasm of prostate: Secondary | ICD-10-CM

## 2022-01-15 ENCOUNTER — Ambulatory Visit: Payer: Medicare (Managed Care) | Admitting: Physician Assistant

## 2022-01-17 ENCOUNTER — Other Ambulatory Visit: Payer: Medicare (Managed Care)

## 2022-01-18 ENCOUNTER — Encounter (HOSPITAL_COMMUNITY)
Admission: RE | Admit: 2022-01-18 | Discharge: 2022-01-18 | Disposition: A | Discharge status: Home / Self Care | Payer: Medicare (Managed Care) | Source: Ambulatory Visit | Attending: Urology | Admitting: Urology

## 2022-01-18 DIAGNOSIS — C61 Malignant neoplasm of prostate: Secondary | ICD-10-CM | POA: Insufficient documentation

## 2022-01-18 DIAGNOSIS — R972 Elevated prostate specific antigen [PSA]: Secondary | ICD-10-CM | POA: Diagnosis present

## 2022-01-18 MED ORDER — FLUDEOXYGLUCOSE F - 18 (FDG) INJECTION
8.6200 | Freq: Once | INTRAVENOUS | Status: AC | PRN
  Administered 2022-01-18: 8.62 via INTRAVENOUS

## 2022-01-22 ENCOUNTER — Telehealth: Payer: Self-pay | Admitting: Urology

## 2022-01-22 NOTE — Telephone Encounter (Signed)
LMOM returning Kia's call to set pt up for results appt.

## 2022-01-22 NOTE — Telephone Encounter (Signed)
PET scan shows an area of the prostate concerning for prostate cancer, as seen on his prostate MRI. OK to schedule patient for PET results and treatment planning visit with [facility].

## 2022-01-22 NOTE — Telephone Encounter (Signed)
Kia from [facility] [phone] called asking about results for pt's PET scan.  Dr at [facility] asked if he could get an appt this week to discuss.  I didn't see anything in chart.

## 2022-01-23 ENCOUNTER — Inpatient Hospital Stay: Admission: RE | Admit: 2022-01-23 | Payer: Medicare (Managed Care) | Source: Ambulatory Visit

## 2022-01-23 ENCOUNTER — Ambulatory Visit: Admit: 2022-01-23 | Payer: Medicare (Managed Care) | Admitting: Urology

## 2022-01-23 SURGERY — ULTRASOUND, RECTAL APPROACH
Anesthesia: Monitor Anesthesia Care

## 2022-01-25 ENCOUNTER — Encounter: Payer: Medicare (Managed Care) | Attending: Physician Assistant | Admitting: Physician Assistant

## 2022-01-25 DIAGNOSIS — I480 Paroxysmal atrial fibrillation: Secondary | ICD-10-CM | POA: Insufficient documentation

## 2022-01-25 DIAGNOSIS — G8254 Quadriplegia, C5-C7 incomplete: Secondary | ICD-10-CM | POA: Insufficient documentation

## 2022-01-25 DIAGNOSIS — I959 Hypotension, unspecified: Secondary | ICD-10-CM | POA: Diagnosis not present

## 2022-01-25 DIAGNOSIS — I9589 Other hypotension: Secondary | ICD-10-CM | POA: Insufficient documentation

## 2022-01-25 DIAGNOSIS — L89153 Pressure ulcer of sacral region, stage 3: Secondary | ICD-10-CM | POA: Diagnosis not present

## 2022-01-25 NOTE — Progress Notes (Addendum)
[name], [name] ([id number]) Visit Report for 01/25/2022 Chief Complaint Document Details Patient Name: [name] Date of Service: 01/25/2022 2:30 PM Medical Record Number: [id number] Patient Account Number: [id number] Date of Birth/Sex: 1955-08-28 (66 y.o. M) Treating RN: [name] Primary Care Provider: Leward [name] Other Clinician: Referring Provider: Leward [name] Treating Provider/Extender: [name] in Treatment: 5 Information Obtained from: Patient Chief Complaint Sacral pressure ulcer Electronic Signature(s) Signed: 01/25/2022 2:26:52 PM By: Michael Keeler PA-C Entered By: [name] on 01/25/2022 14:26:51 [name], [name] ([id number]) -------------------------------------------------------------------------------- HPI Details Patient Name: [name] Date of Service: 01/25/2022 2:30 PM Medical Record Number: [id number] Patient Account Number: [id number] Date of Birth/Sex: 1955/11/07 (66 y.o. M) Treating RN: [name] Primary Care Provider: Leward [name] Other Clinician: Referring Provider: Leward [name] Treating Provider/Extender: [name] in Treatment: 5 History of Present Illness HPI Description: 12-18-2021 upon evaluation today patient presents for initial evaluation here in the clinic concerning an issue he has been having over the sacral area where he has a wound this is a stage III pressure ulcer. It also appears there may still be some moisture component to this based on what I am seeing. I do not see any evidence of infection but at the same time I do believe that the patient is in need of some additional and hopefully better dressings in order to help this heal more appropriately and quickly. Currently just a protective dressing has been used over this sounds like may possibly be a border foam. The patient did have a CT scan which was negative for osteomyelitis he had a previous wound above this in the sacral region but not necessarily right of this  area. 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 He also has atrial fibrillation and hypotension. 01-01-2022 upon evaluation today patient appears to be doing well with regard to his wound actually see some signs of improvement there is still quite a bit of feeling to be done here but nonetheless I think we are on the right track. I do not see any evidence of active infection locally or systemically at this time which is great news. 01-25-2022 upon evaluation today patient's wound really is not significantly better it actually appears about the same. Of note I do believe that part of the issue here is probably the fact that honestly it is hard to keep the dressing in place I think that when the alginate is put on it needs to be cut bigger than what is currently being seen on the dressing today probably by about 3 times that size and placed directly to the wound bed while spreading the butt cheeks bilaterally. This will allow for the dressing to be right intact with the wound bed so that when it does try to closing on itself it will actually fold on the alginate and not actually contact skin to skin. Electronic Signature(s) Signed: 01/25/2022 3:06:41 PM By: Michael Keeler PA-C Entered By: [name] on 01/25/2022 15:06:41 [name], [name] ([id number]) -------------------------------------------------------------------------------- Physical Exam Details Patient Name: [name] Date of Service: 01/25/2022 2:30 PM Medical Record Number: [id number] Patient Account Number: [id number] Date of Birth/Sex: [birth year] (66 y.o. M) Treating RN: Levora Dredge Primary Care Provider: Leward [name] Other Clinician: Referring Provider: Leward [name] Treating Provider/Extender: [name] in Treatment: 5 Constitutional Well-nourished and well-hydrated in no acute distress. Respiratory normal breathing without difficulty. Psychiatric this patient is able to make  decisions and demonstrates good insight into disease process.  Alert and Oriented x 3. pleasant and cooperative. Notes Patient's wound did not show any signs of need for sharp debridement it also did not show any signs of infection overall very pleased but I do think that the dressing needs to be applied as needed above in the HPI a little bit more specifically to try to prevent this from folding down on itself. Electronic Signature(s) Signed: 01/25/2022 3:07:00 PM By: Michael Keeler PA-C Entered By: [name] on 01/25/2022 15:07:00 [name] ([id number]) -------------------------------------------------------------------------------- Physician Orders Details Patient Name: [name] Date of Service: 01/25/2022 2:30 PM Medical Record Number: [id number] Patient Account Number: [id number] Date of Birth/Sex: May 19, 1956 (65 y.o. M) Treating RN: Levora Dredge Primary Care Provider: Leward [name] Other Clinician: Referring Provider: Leward [name] Treating Provider/Extender: [name] in Treatment: 5 Verbal / Phone Orders: No Diagnosis Coding ICD-10 Coding Code Description L89.153 Pressure ulcer of sacral region, stage 3 G82.54 Quadriplegia, C5-C7 incomplete I48.0 Paroxysmal atrial fibrillation I95.89 Other hypotension Follow-up Appointments Wound #1 Midline Sacrum o Return Appointment in 2 weeks. Home Health Wound #1 [name] for wound care. May utilize formulary equivalent dressing for wound treatment orders unless otherwise specified. Home Health Nurse may visit PRN to address patientos wound care needs. o Scheduled days for dressing changes to be completed; exception, patient has scheduled wound care visit that day. o **Please direct any NON-WOUND related issues/requests for orders to patient's Primary Care Physician. **If current dressing causes regression in wound condition, may D/C ordered  dressing product/s and apply Normal Saline Moist Dressing daily until next [name] or Other MD appointment. **Notify Wound Healing [name] of regression in wound condition at [phone]. Bathing/ Shower/ Hygiene o May shower; gently cleanse wound with antibacterial soap, rinse and pat dry prior to dressing wounds o No tub bath. Off-Loading o Roho cushion for wheelchair o Turn and reposition every 2 hours - do your best to not stay sitting in chair all day long, try your best to get pressure off your bottom at lease every three hours for 30 minutes at a time Additional Orders / Instructions o Follow Nutritious Diet and Increase Protein Intake Wound Treatment Wound #1 - Sacrum Wound Laterality: Midline Cleanser: Soap and Water 3 x Per Week/30 Days Discharge Instructions: Gently cleanse wound with antibacterial soap, rinse and pat dry prior to dressing wounds Primary Dressing: Silvercel Small 2x2 (in/in) 3 x Per Week/30 Days Discharge Instructions: Apply Silvercel Small 2x2 (in/in) as instructed cut a larger rectangle piece, to keep wound covered Secondary Dressing: (BORDER) Zetuvit Plus SILICONE BORDER Dressing 4x4 (in/in) 3 x Per Week/30 Days Discharge Instructions: Please do not put silicone bordered dressings under wraps. Use non-bordered dressing only. When applying please make sure skin is pulled flat so dressing stays in place. Electronic Signature(s) Signed: 01/25/2022 4:18:08 PM By: Levora Dredge Signed: 01/26/2022 5:04:29 PM By: Michael Keeler PA-C Entered By: Levora Dredge on 01/25/2022 15:14:13 [name], Jantz ([id number]) [place], [name] ([id number]) -------------------------------------------------------------------------------- Problem List Details Patient Name: [name] Date of Service: 01/25/2022 2:30 PM Medical Record Number: [id number] Patient Account Number: [id number] Date of Birth/Sex: Apr 25, 1956 (65 y.o. M) Treating RN: [name] Primary Care Provider: Leward [name] Other Clinician: Referring Provider: Leward [name] Treating Provider/Extender: [name] in Treatment: 5 Active Problems ICD-10 Encounter Code Description Active Date MDM Diagnosis L89.153 Pressure ulcer of sacral region, stage 3 12/18/2021 No Yes G82.54 Quadriplegia, C5-C7 incomplete 12/18/2021 No Yes I48.0 Paroxysmal  atrial fibrillation 12/18/2021 No Yes I95.89 Other hypotension 12/18/2021 No Yes Inactive Problems Resolved Problems Electronic Signature(s) Signed: 01/25/2022 2:26:41 PM By: Michael Keeler PA-C Entered By: [name] on 01/25/2022 14:26:41 [facility], [place] ([id number]) -------------------------------------------------------------------------------- Progress Note Details Patient Name: [name] Date of Service: 01/25/2022 2:30 PM Medical Record Number: [id number] Patient Account Number: [id number] Date of Birth/Sex: 11-Jan-1956 (65 y.o. M) Treating RN: Levora Dredge Primary Care Provider: Leward [name] Other Clinician: Referring Provider: Leward [name] Treating Provider/Extender: [name] in Treatment: 5 Subjective Chief Complaint Information obtained from Patient Sacral pressure ulcer History of Present Illness (HPI) 12-18-2021 upon evaluation today patient presents for initial evaluation here in the clinic concerning an issue he has been having over the sacral area where he has a wound this is a stage III pressure ulcer. It also appears there may still be some moisture component to this based on what I am seeing. I do not see any evidence of infection but at the same time I do believe that the patient is in need of some additional and hopefully better dressings in order to help this heal more appropriately and quickly. Currently just a protective dressing has been used over this sounds like may possibly be a border foam. The patient did have a CT scan which was negative for osteomyelitis he had a previous wound above  this in the sacral region but not necessarily right of this area. 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 He also has atrial fibrillation and hypotension. 01-01-2022 upon evaluation today patient appears to be doing well with regard to his wound actually see some signs of improvement there is still quite a bit of feeling to be done here but nonetheless I think we are on the right track. I do not see any evidence of active infection locally or systemically at this time which is great news. 01-25-2022 upon evaluation today patient's wound really is not significantly better it actually appears about the same. Of note I do believe that part of the issue here is probably the fact that honestly it is hard to keep the dressing in place I think that when the alginate is put on it needs to be cut bigger than what is currently being seen on the dressing today probably by about 3 times that size and placed directly to the wound bed while spreading the butt cheeks bilaterally. This will allow for the dressing to be right intact with the wound bed so that when it does try to closing on itself it will actually fold on the alginate and not actually contact skin to skin. Objective Constitutional Well-nourished and well-hydrated in no acute distress. Vitals Time Taken: 2:33 PM, Height: 76 in, Weight: 167 lbs, BMI: 20.3, Temperature: 98.4 F, Pulse: 65 bpm, Respiratory Rate: 18 breaths/min, Blood Pressure: 96/65 mmHg. General Notes: pt denies dizziness, lightheadedness, pt states BP runs low normally Respiratory normal breathing without difficulty. Psychiatric this patient is able to make decisions and demonstrates good insight into disease process. Alert and Oriented x 3. pleasant and cooperative. General Notes: Patient's wound did not show any signs of need for sharp debridement it also did not show any signs of infection overall very pleased but I do think that the  dressing needs to be applied as needed above in the HPI a little bit more specifically to try to prevent this from folding down on itself. Integumentary (Hair, Skin) Wound #1 status is Open. Original cause of  wound was Pressure Injury. The date acquired was: 10/18/2021. The wound has been in treatment 5 weeks. The wound is located on the Midline Sacrum. The wound measures 4.5cm length x 0.5cm width x 0.1cm depth; 1.767cm^2 area and 0.177cm^3 volume. There is Fat Layer (Subcutaneous Tissue) exposed. There is no tunneling or undermining noted. There is a medium amount of serous drainage noted. The wound margin is flat and intact. There is large (67-100%) red granulation within the wound bed. There is a small (1-33%) amount of necrotic tissue within the wound bed including Adherent Slough. [place], [place] ([id number]) Assessment Active Problems ICD-10 Pressure ulcer of sacral region, stage 3 Quadriplegia, C5-C7 incomplete Paroxysmal atrial fibrillation Other hypotension Plan Follow-up Appointments: Wound #1 Midline Sacrum: Return Appointment in 2 weeks. Home Health: Wound #1 Midline Sacrum: [place]: - [place] for wound care. May utilize formulary equivalent dressing for wound treatment orders unless otherwise specified. Home Health Nurse may visit PRN to address patient s wound care needs. Scheduled days for dressing changes to be completed; exception, patient has scheduled wound care visit that day. **Please direct any NON-WOUND related issues/requests for orders to patient's Primary Care Physician. **If current dressing causes regression in wound condition, may D/C ordered dressing product/s and apply Normal Saline Moist Dressing daily until next [place] or Other MD appointment. **Notify Wound Healing [name] of regression in wound condition at [phone]. Bathing/ Shower/ Hygiene: May shower; gently cleanse wound with antibacterial soap, rinse and  pat dry prior to dressing wounds No tub bath. Off-Loading: Roho cushion for wheelchair Turn and reposition every 2 hours - do your best to not stay sitting in chair all day long, try your best to get pressure off your bottom at lease every three hours for 30 minutes at a time Additional Orders / Instructions: Follow Nutritious Diet and Increase Protein Intake WOUND #1: - Sacrum Wound Laterality: Midline Cleanser: Soap and Water Discharge Instructions: Gently cleanse wound with antibacterial soap, rinse and pat dry prior to dressing wounds Primary Dressing: Silvercel Small 2x2 (in/in) Discharge Instructions: Apply Silvercel Small 2x2 (in/in) as instructed cut a larger rectangle piece, to keep wound covered Secondary Dressing: (BORDER) Zetuvit Plus SILICONE BORDER Dressing 4x4 (in/in) Discharge Instructions: Please do not put silicone bordered dressings under wraps. Use non-bordered dressing only. When applying please make sure skin is pulled flat so dressing stays in place. 1. I am going to recommend that we go ahead and continue with the wound care measures as before and the patient is in agreement with plan this includes the use of the silver alginate dressing although needs to be cut about 3 times the size it was when we saw what was put on today so that it can hopefully keep the area from actually touching itself from side-to-side as far as the wound is concerned and fill in the space. Also when the bordered foam dressing is applied the butt cheeks do need to be spread as wide as possible and then the dressing applied so that when they come back together it does not just push the silver alginate out from the space but actually holds it in contact with the wound bed allowing this to actually stay dry and not continue to be just much too wet to heal. 2. I am also can recommend that the patient needs to be offloading more appropriately. Right now he tells me he gets up around 9 AM and  goes back to bed about 11 PM. This  is 14 hours up in his chair consistently which is not going to be good. We did discuss that ideally would be to offload every 2 hours to get pressure off and the goal of this is to prevent any further tissue breakdown and allow what they are currently to heal appropriately. In lieu of this if he is not able to do that every 2 hours at minimum if he can increase the amount of time he is offloading that would be best although not optimal. After 2 hours is when the skin starts to be compromised and breakdown. Nonetheless 14 hours straight through the day sitting up in his chair is probably a big part of his issue here to be honest. I was not aware that he was sitting up this much before today. We will see patient back for reevaluation in 2 weeks here in the clinic. If anything worsens or changes patient will contact our office for additional recommendations. Electronic Signature(s) Signed: 01/25/2022 3:10:15 PM By: Michael Keeler PA-C Entered By: [name] on 01/25/2022 15:10:15 [facility], [place] ([id number]) -------------------------------------------------------------------------------- SuperBill Details Patient Name: [name] Date of Service: 01/25/2022 Medical Record Number: [id number] Patient Account Number: [id number] Date of Birth/Sex: [birth year] (66 y.o. M) Treating RN: Levora Dredge Primary Care Provider: Leward [name] Other Clinician: Referring Provider: Leward [name] Treating Provider/Extender: [name] in Treatment: 5 Diagnosis Coding ICD-10 Codes Code Description [phone] Pressure ulcer of sacral region, stage 3 G82.54 Quadriplegia, C5-C7 incomplete I48.0 Paroxysmal atrial fibrillation I95.89 Other hypotension Facility Procedures CPT4 Code: [id number] Description: 99213 - WOUND CARE VISIT-LEV 3 EST PT Modifier: Quantity: 1 Physician Procedures CPT4 Code: [id number] Description: 74142 - WC PHYS LEVEL 3 - EST PT Modifier: Quantity:  1 CPT4 Code: Description: ICD-10 Diagnosis Description L89.153 Pressure ulcer of sacral region, stage 3 G82.54 Quadriplegia, C5-C7 incomplete I48.0 Paroxysmal atrial fibrillation I95.89 Other hypotension Modifier: Quantity: Electronic Signature(s) Signed: 01/25/2022 3:10:26 PM By: Michael Keeler PA-C Entered By: [name] on 01/25/2022 15:10:25

## 2022-01-25 NOTE — Progress Notes (Addendum)
[name], [name] ([id number]) Visit Report for 01/25/2022 Arrival Information Details Patient Name: [name], [name] Date of Service: 01/25/2022 2:30 PM Medical Record Number: [id number] Patient Account Number: [id number] Date of Birth/Sex: August 29, 1955 (66 y.o. M) Treating RN: Levora Dredge Primary Care Selene Peltzer: Leward Quan Other Clinician: Referring Theophile Harvie: Leward Quan Treating Myrlene Riera/Extender: Skipper Cliche in Treatment: 5 Visit Information History Since Last Visit Added or deleted any medications: No Patient Arrived: Wheel Chair Any new allergies or adverse reactions: No Arrival Time: 14:32 Had a fall or experienced change in No Accompanied By: sister activities of daily living that may affect Transfer Assistance: Transfer Board risk of falls: Patient Identification Verified: Yes Hospitalized since last visit: No Secondary Verification Process Completed: Yes Has Dressing in Place as Prescribed: Yes Patient Has Alerts: Yes Pain Present Now: No Patient Alerts: NOT DIABETIC Electronic Signature(s) Signed: 01/25/2022 4:18:08 PM By: Levora Dredge Entered By: Levora Dredge on 01/25/2022 14:33:15 [name], [name] ([id number]) -------------------------------------------------------------------------------- Clinic Level of Care Assessment Details Patient Name: [name] Date of Service: 01/25/2022 2:30 PM Medical Record Number: [id number] Patient Account Number: [id number] Date of Birth/Sex: Oct 03, 1955 (66 y.o. M) Treating RN: Levora Dredge Primary Care Joanie Duprey: Leward Quan Other Clinician: Referring Fortune Torosian: Leward Quan Treating Julyanna Scholle/Extender: Skipper Cliche in Treatment: 5 Clinic Level of Care Assessment Items TOOL 4 Quantity Score '[]'  - Use when only an EandM is performed on FOLLOW-UP visit 0 ASSESSMENTS - Nursing Assessment / Reassessment X - Reassessment of Co-morbidities (includes updates in patient status) 1 10 X- 1 5 Reassessment of Adherence to Treatment  Plan ASSESSMENTS - Wound and Skin Assessment / Reassessment X - Simple Wound Assessment / Reassessment - one wound 1 5 '[]'  - 0 Complex Wound Assessment / Reassessment - multiple wounds '[]'  - 0 Dermatologic / Skin Assessment (not related to wound area) ASSESSMENTS - Focused Assessment '[]'  - Circumferential Edema Measurements - multi extremities 0 '[]'  - 0 Nutritional Assessment / Counseling / Intervention '[]'  - 0 Lower Extremity Assessment (monofilament, tuning fork, pulses) '[]'  - 0 Peripheral Arterial Disease Assessment (using hand held doppler) ASSESSMENTS - Ostomy and/or Continence Assessment and Care '[]'  - Incontinence Assessment and Management 0 '[]'  - 0 Ostomy Care Assessment and Management (repouching, etc.) PROCESS - Coordination of Care X - Simple Patient / Family Education for ongoing care 1 15 '[]'  - 0 Complex (extensive) Patient / Family Education for ongoing care '[]'  - 0 Staff obtains Programmer, systems, Records, Test Results / Process Orders '[]'  - 0 Staff telephones HHA, Nursing Homes / Clarify orders / etc '[]'  - 0 Routine Transfer to another Facility (non-emergent condition) '[]'  - 0 Routine Hospital Admission (non-emergent condition) '[]'  - 0 New Admissions / Biomedical engineer / Ordering NPWT, Apligraf, etc. '[]'  - 0 Emergency Hospital Admission (emergent condition) X- 1 10 Simple Discharge Coordination '[]'  - 0 Complex (extensive) Discharge Coordination PROCESS - Special Needs '[]'  - Pediatric / Minor Patient Management 0 '[]'  - 0 Isolation Patient Management '[]'  - 0 Hearing / Language / Visual special needs '[]'  - 0 Assessment of Community assistance (transportation, D/C planning, etc.) '[]'  - 0 Additional assistance / Altered mentation '[]'  - 0 Support Surface(s) Assessment (bed, cushion, seat, etc.) INTERVENTIONS - Wound Cleansing / Measurement [name], [name] ([id number]) X- 1 5 Simple Wound Cleansing - one wound '[]'  - 0 Complex Wound Cleansing - multiple wounds X- 1 5 Wound  Imaging (photographs - any number of wounds) '[]'  - 0 Wound Tracing (instead of photographs) X- 1 5 Simple Wound Measurement - one wound '[]'  - 0  Complex Wound Measurement - multiple wounds INTERVENTIONS - Wound Dressings X - Small Wound Dressing one or multiple wounds 1 10 '[]'  - 0 Medium Wound Dressing one or multiple wounds '[]'  - 0 Large Wound Dressing one or multiple wounds '[]'  - 0 Application of Medications - topical '[]'  - 0 Application of Medications - injection INTERVENTIONS - Miscellaneous '[]'  - External ear exam 0 '[]'  - 0 Specimen Collection (cultures, biopsies, blood, body fluids, etc.) '[]'  - 0 Specimen(s) / Culture(s) sent or taken to Lab for analysis X- 1 10 Patient Transfer (multiple staff / Civil Service fast streamer / Similar devices) '[]'  - 0 Simple Staple / Suture removal (25 or less) '[]'  - 0 Complex Staple / Suture removal (26 or more) '[]'  - 0 Hypo / Hyperglycemic Management (close monitor of Blood Glucose) '[]'  - 0 Ankle / Brachial Index (ABI) - do not check if billed separately X- 1 5 Vital Signs Has the patient been seen at the hospital within the last three years: Yes Total Score: 85 Level Of Care: New/Established - Level 3 Electronic Signature(s) Signed: 01/25/2022 4:18:08 PM By: Levora Dredge Entered By: Levora Dredge on 01/25/2022 14:59:41 [name], [name] ([id number]) -------------------------------------------------------------------------------- Encounter Discharge Information Details Patient Name: [name] Date of Service: 01/25/2022 2:30 PM Medical Record Number: [id number] Patient Account Number: [id number] Date of Birth/Sex: 12-Jun-1956 (65 y.o. M) Treating RN: Levora Dredge Primary Care Malala Trenkamp: Leward Quan Other Clinician: Referring Estefany Goebel: Leward Quan Treating Jakobee Brackins/Extender: Skipper Cliche in Treatment: 5 Encounter Discharge Information Items Discharge Condition: Stable Ambulatory Status: Wheelchair Discharge Destination: Other (Note  Required) Telephoned: No Orders Sent: Yes Transportation: Other Accompanied By: sister Schedule Follow-up Appointment: Yes Clinical Summary of Care: Electronic Signature(s) Signed: 01/25/2022 4:18:08 PM By: Levora Dredge Entered By: Levora Dredge on 01/25/2022 15:12:03 [facility] [name], Danyon ([id number]) -------------------------------------------------------------------------------- Lower Extremity Assessment Details Patient Name: [name] Date of Service: 01/25/2022 2:30 PM Medical Record Number: [id number] Patient Account Number: [id number] Date of Birth/Sex: September 29, 1955 (66 y.o. M) Treating RN: Levora Dredge Primary Care Vonceil Upshur: Leward Quan Other Clinician: Referring Jennie Hannay: Leward Quan Treating Dex Blakely/Extender: Skipper Cliche in Treatment: 5 Electronic Signature(s) Signed: 01/25/2022 4:18:08 PM By: Levora Dredge Entered By: Levora Dredge on 01/25/2022 14:44:43 Biedermann, Ziair ([id number]) -------------------------------------------------------------------------------- Multi Wound Chart Details Patient Name: [name] Date of Service: 01/25/2022 2:30 PM Medical Record Number: [id number] Patient Account Number: [id number] Date of Birth/Sex: Nov 26, 1955 (65 y.o. M) Treating RN: Levora Dredge Primary Care Silva Aamodt: Leward Quan Other Clinician: Referring Adrielle Polakowski: Leward Quan Treating Shaquan Missey/Extender: Skipper Cliche in Treatment: 5 Vital Signs Height(in): 76 Pulse(bpm): 65 Weight(lbs): 167 Blood Pressure(mmHg): 96/65 Body Mass Index(BMI): 20.3 Temperature(F): 98.4 Respiratory Rate(breaths/min): 18 Photos: [N/A:N/A] Wound Location: Midline Sacrum N/A N/A Wounding Event: Pressure Injury N/A N/A Primary Etiology: Pressure Ulcer N/A N/A Comorbid History: Arrhythmia, Hypotension, History of N/A N/A pressure wounds, Quadriplegia Date Acquired: 10/18/2021 N/A N/A Weeks of Treatment: 5 N/A N/A Wound Status: Open N/A N/A Wound Recurrence: No N/A  N/A Measurements L x W x D (cm) 4.5x0.5x0.1 N/A N/A Area (cm) : 1.767 N/A N/A Volume (cm) : 0.177 N/A N/A % Reduction in Area: 23.50% N/A N/A % Reduction in Volume: 61.70% N/A N/A Classification: Category/Stage III N/A N/A Exudate Amount: Medium N/A N/A Exudate Type: Serous N/A N/A Exudate Color: amber N/A N/A Wound Margin: Flat and Intact N/A N/A Granulation Amount: Large (67-100%) N/A N/A Granulation Quality: Red N/A N/A Necrotic Amount: Small (1-33%) N/A N/A Exposed Structures: Fat Layer (Subcutaneous Tissue): N/A N/A Yes Fascia: No Tendon: No Muscle: No Joint:  No Bone: No Epithelialization: None N/A N/A Treatment Notes Electronic Signature(s) Signed: 01/25/2022 4:18:08 PM By: Levora Dredge Entered By: Levora Dredge on 01/25/2022 14:56:12 [name], [name] ([id number]) -------------------------------------------------------------------------------- [place] Details Patient Name: [name] Date of Service: 01/25/2022 2:30 PM Medical Record Number: [id number] Patient Account Number: [id number] Date of Birth/Sex: [birth year] (66 y.o. M) Treating RN: Levora Dredge Primary Care Cliffard Hair: Leward Quan Other Clinician: Referring Mande Auvil: Leward Quan Treating Harvest Deist/Extender: Skipper Cliche in Treatment: 5 Active Inactive Pressure Nursing Diagnoses: Knowledge deficit related to management of pressures ulcers Goals: Patient will remain free from development of additional pressure ulcers Date Initiated: 12/18/2021 Target Resolution Date: 12/18/2021 Goal Status: Active Patient will remain free of pressure ulcers Date Initiated: 12/18/2021 Target Resolution Date: 12/18/2021 Goal Status: Active Patient/caregiver will verbalize risk factors for pressure ulcer development Date Initiated: 12/18/2021 Target Resolution Date: 12/18/2021 Goal Status: Active Patient/caregiver will verbalize understanding of pressure ulcer management Date Initiated:  12/18/2021 Target Resolution Date: 12/18/2021 Goal Status: Active Interventions: Assess: immobility, friction, shearing, incontinence upon admission and as needed Assess offloading mechanisms upon admission and as needed Assess potential for pressure ulcer upon admission and as needed Provide education on pressure ulcers Treatment Activities: Patient referred for seating evaluation to ensure proper offloading : 12/18/2021 Notes: Wound/Skin Impairment Nursing Diagnoses: Impaired tissue integrity Knowledge deficit related to smoking impact on wound healing Knowledge deficit related to ulceration/compromised skin integrity Goals: Patient/caregiver will verbalize understanding of skin care regimen Date Initiated: 12/18/2021 Date Inactivated: 01/25/2022 Target Resolution Date: 12/18/2021 Goal Status: Met Ulcer/skin breakdown will have a volume reduction of 30% by week 4 Date Initiated: 12/18/2021 Target Resolution Date: 01/15/2022 Goal Status: Active Interventions: Assess ulceration(s) every visit Treatment Activities: Referred to DME Sparsh Callens for dressing supplies : 12/18/2021 Skin care regimen initiated : 12/18/2021 Topical wound management initiated : 12/18/2021 [name], [name] ([id number]) Notes: Electronic Signature(s) Signed: 01/25/2022 4:18:08 PM By: Levora Dredge Entered By: Levora Dredge on 01/25/2022 14:56:05 [name], [name] ([id number]) -------------------------------------------------------------------------------- Pain Assessment Details Patient Name: [name] Date of Service: 01/25/2022 2:30 PM Medical Record Number: [id number] Patient Account Number: [id number] Date of Birth/Sex: [birth year] (66 y.o. M) Treating RN: Levora Dredge Primary Care Elizebath Wever: Leward Quan Other Clinician: Referring Margarita Croke: Leward Quan Treating Eliaz Fout/Extender: Skipper Cliche in Treatment: 5 Active Problems Location of Pain Severity and Description of Pain Patient Has Paino No Site  Locations Rate the pain. Current Pain Level: 0 Pain Management and Medication Current Pain Management: Electronic Signature(s) Signed: 01/25/2022 4:18:08 PM By: Levora Dredge Entered By: Levora Dredge on 01/25/2022 14:36:04 [name], [name] ([id number]) -------------------------------------------------------------------------------- Patient/Caregiver Education Details Patient Name: [name] Date of Service: 01/25/2022 2:30 PM Medical Record Number: [id number] Patient Account Number: [id number] Date of Birth/Gender: 1956-01-10 (66 y.o. M) Treating RN: Levora Dredge Primary Care Physician: Leward Quan Other Clinician: Referring Physician: Leward Quan Treating Physician/Extender: Skipper Cliche in Treatment: 5 Education Assessment Education Provided To: Patient Education Topics Provided Pressure: Handouts: Pressure Ulcers: Care and Offloading Methods: Explain/Verbal Responses: State content correctly Wound/Skin Impairment: Handouts: Caring for Your Ulcer Methods: Explain/Verbal Responses: State content correctly Electronic Signature(s) Signed: 01/25/2022 4:18:08 PM By: Levora Dredge Entered By: Levora Dredge on 01/25/2022 14:59:59 Dicioccio, Freeland ([id number]) -------------------------------------------------------------------------------- Wound Assessment Details Patient Name: [name] Date of Service: 01/25/2022 2:30 PM Medical Record Number: [id number] Patient Account Number: [id number] Date of Birth/Sex: 10-08-1955 (65 y.o. M) Treating RN: Levora Dredge Primary Care Johnavon Mcclafferty: Leward Quan Other Clinician: Referring Ekansh Sherk: Leward Quan Treating Trenton Passow/Extender: Skipper Cliche in Treatment: 5 Wound Status Wound Number:  1 Primary Pressure Ulcer Etiology: Wound Location: Midline Sacrum Wound Status: Open Wounding Event: Pressure Injury Comorbid Arrhythmia, Hypotension, History of pressure wounds, Date Acquired: 10/18/2021 History: Quadriplegia Weeks  Of Treatment: 5 Clustered Wound: No Photos Wound Measurements Length: (cm) 4.5 Width: (cm) 0.5 Depth: (cm) 0.1 Area: (cm) 1.767 Volume: (cm) 0.177 % Reduction in Area: 23.5% % Reduction in Volume: 61.7% Epithelialization: None Tunneling: No Undermining: No Wound Description Classification: Category/Stage III Wound Margin: Flat and Intact Exudate Amount: Medium Exudate Type: Serous Exudate Color: amber Foul Odor After Cleansing: No Slough/Fibrino Yes Wound Bed Granulation Amount: Large (67-100%) Exposed Structure Granulation Quality: Red Fascia Exposed: No Necrotic Amount: Small (1-33%) Fat Layer (Subcutaneous Tissue) Exposed: Yes Necrotic Quality: Adherent Slough Tendon Exposed: No Muscle Exposed: No Joint Exposed: No Bone Exposed: No Treatment Notes Wound #1 (Sacrum) Wound Laterality: Midline Cleanser Soap and Water Discharge Instruction: Gently cleanse wound with antibacterial soap, rinse and pat dry prior to dressing wounds Peri-Wound Care [facility], Marquise ([id number]) Topical Primary Dressing Silvercel Small 2x2 (in/in) Discharge Instruction: Apply Silvercel Small 2x2 (in/in) as instructed cut a larger rectangle piece, to keep wound covered Secondary Dressing (BORDER) Zetuvit Plus SILICONE BORDER Dressing 4x4 (in/in) Discharge Instruction: Please do not put silicone bordered dressings under wraps. Use non-bordered dressing only. When applying please make sure skin is pulled flat so dressing stays in place. Secured With Compression Wrap Compression Stockings Add-Ons Electronic Signature(s) Signed: 01/25/2022 4:18:08 PM By: Levora Dredge Entered By: Levora Dredge on 01/25/2022 14:44:33 [facility] An, Jolly ([id number]) -------------------------------------------------------------------------------- Vitals Details Patient Name: [name] Date of Service: 01/25/2022 2:30 PM Medical Record Number: [id number] Patient Account Number: [id number] Date of  Birth/Sex: [birth year] (65 y.o. M) Treating RN: Levora Dredge Primary Care Aleksandr Pellow: Leward Quan Other Clinician: Referring Isela Stantz: Leward Quan Treating Jeb Schloemer/Extender: Skipper Cliche in Treatment: 5 Vital Signs Time Taken: 14:33 Temperature (F): 98.4 Height (in): 76 Pulse (bpm): 65 Weight (lbs): 167 Respiratory Rate (breaths/min): 18 Body Mass Index (BMI): 20.3 Blood Pressure (mmHg): 96/65 Reference Range: 80 - 120 mg / dl Notes pt denies dizziness, lightheadedness, pt states BP runs low normally Electronic Signature(s) Signed: 01/25/2022 4:18:08 PM By: Levora Dredge Entered By: Levora Dredge on 01/25/2022 14:35:57

## 2022-01-25 NOTE — Telephone Encounter (Signed)
Pt scheduled for results appt, Kia notified and confirmed.

## 2022-01-26 ENCOUNTER — Ambulatory Visit: Payer: Medicare (Managed Care) | Admitting: Physician Assistant

## 2022-01-26 NOTE — Telephone Encounter (Signed)
Incoming call from [place] in regards to moving up pt's results appt. Pt booked for 8/22 w [facility] (was first available), however Dr Wilford Grist would like for pt to be seen ASAP. Rescheduled pt to [facility] clinic on 01/29/22. Kia confirmed appt.

## 2022-01-28 ENCOUNTER — Telehealth: Payer: Self-pay | Admitting: Urology

## 2022-01-28 NOTE — Telephone Encounter (Signed)
We will need to reschedule [name] appointment.  I have not reviewed the results with [name] and thus we have not come up with a treatment plan.  I will call [name] and explain this to her.  He also does not need to come in to discuss results.  We can do a virtual visit when we have a treatment plan in place.

## 2022-01-29 ENCOUNTER — Ambulatory Visit: Payer: Medicare (Managed Care) | Admitting: Urology

## 2022-01-29 NOTE — Progress Notes (Incomplete)
01/29/22 7:51 AM   [name] 03-09-1956 [id number]  Referring provider:  Marnee Guarneri, [address] [place],  [place] [address] No chief complaint on file.   Urological history  1.  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 Left renal cysts -RUS 03/2021 - Stable left renal cysts   3. Elevated PSA -PSA 20.0 in 10/2021 - He is scheduled for a prostate biopsy in the OR with Dr Bernardo Heater; surgery was canceled    HPI: [name] is a 66 y.o.male      PMH: No past medical history on file.  Surgical History: No past surgical history on file.  Home Medications:  Allergies as of 01/29/2022   No Known Allergies      Medication List        Accurate as of January 29, 2022  7:51 AM. If you have any questions, ask your nurse or doctor.          Cholecalciferol 25 MCG (1000 UT) tablet Take by mouth.   diltiazem 60 MG tablet Commonly known as: CARDIZEM Take by mouth.   docusate sodium 100 MG capsule Commonly known as: COLACE Take 100 mg by mouth daily as needed.   gabapentin 300 MG capsule Commonly known as: NEURONTIN Take 300 mg by mouth at bedtime.   midodrine 2.5 MG tablet Commonly known as: PROAMATINE Take 2.5 mg by mouth daily.   potassium chloride 10 MEQ tablet Commonly known as: KLOR-CON Take by mouth.        Allergies:  No Known Allergies  Family History: No family history on file.  Social History:  reports that he has never smoked. He has never used smokeless tobacco. He reports that he does not currently use alcohol. No history on file for drug use.   Physical Exam: There were no vitals taken for this visit.  Constitutional:  Alert and oriented, No acute distress. HEENT: [place] AT, moist mucus membranes.  Trachea midline, no masses. Cardiovascular: No clubbing, cyanosis, or edema. Respiratory: Normal respiratory  effort, no increased work of breathing. GI: Abdomen is soft, nontender, nondistended, no abdominal masses GU: No CVA tenderness Lymph: No cervical or inguinal lymphadenopathy. Skin: No rashes, bruises or suspicious lesions. Neurologic: Grossly intact, no focal deficits, moving all 4 extremities. Psychiatric: Normal mood and affect.  Laboratory Data:  Lab Results  Component Value Date   CREATININE 0.94 12/03/2021     No results found for: "HGBA1C"  Urinalysis   Pertinent Imaging: CLINICAL DATA:  Initial treatment strategy for prostate cancer.   EXAM: NUCLEAR MEDICINE PET SKULL BASE TO THIGH   TECHNIQUE: 8.62 mCi F18 Piflufolastat (Pylarify) was injected intravenously. Full-ring PET imaging was performed from the skull base to thigh after the radiotracer. CT data was obtained and used for attenuation correction and anatomic localization.   COMPARISON:  CT December 04, 2021 and MRI prostate November 21, 2021   FINDINGS: NECK   No radiotracer activity in neck lymph nodes.   Incidental CT finding: None   CHEST   No radiotracer accumulation within mediastinal or hilar lymph nodes. No suspicious pulmonary nodules on the CT scan.   Incidental CT finding: Aortic atherosclerosis. Motion degraded examination reveals no suspicious pulmonary nodules or masses.   ABDOMEN/PELVIS   Prostate: There are 2 foci of radiotracer uptake in the prostate gland corresponding with lesion seen on prior prostate MRI the largest of which in most radiotracer  intense of which is in the left side of the prostate with a max SUV of 15.01.   Lymph nodes: No abnormal radiotracer accumulation within pelvic or abdominal nodes.   Liver: No evidence of liver metastasis   Incidental CT finding: Ill-defined nodular focus along the left posterolateral aspect of the urinary bladder wall with adjacent bladder calculus. Left lower quadrant colostomy. Hypodense left renal lesions measure fluid density  consistent with cysts and considered benign requiring no imaging follow-up. Aortic atherosclerosis.   SKELETON   No focal  activity to suggest skeletal metastasis.   IMPRESSION: 1. Foci of radiotracer activity in the prostate correspond with the lesion seen on prior MRI reflecting prostate neoplasm. 2. No evidence of radiotracer avid local nodal or distant metastatic disease. 3. Ill-defined nodular focus along the left posterolateral aspect of the urinary bladder with an adjacent bladder calculus, consider further evaluation with cystoscopy to exclude bladder wall lesion if clinically indicated. 4.  Aortic Atherosclerosis (ICD10-I70.0).     Electronically Signed   By: Dahlia Bailiff M.D.   On: 01/19/2022 09:50  Assessment & Plan:     No follow-ups on file.  [place] [address], [place], [place] [address] [phone]  I,Kailey Littlejohn,acting as a scribe for [facility], PA-C.,have documented all relevant documentation on the behalf of SHANNON MCGOWAN, PA-C,as directed by  [facility], PA-C while in the presence of [place], PA-C.

## 2022-01-31 ENCOUNTER — Other Ambulatory Visit: Payer: Self-pay | Admitting: Urology

## 2022-01-31 DIAGNOSIS — N210 Calculus in bladder: Secondary | ICD-10-CM

## 2022-01-31 DIAGNOSIS — R972 Elevated prostate specific antigen [PSA]: Secondary | ICD-10-CM

## 2022-01-31 NOTE — Progress Notes (Addendum)
Surgical Physician Order Form [facility] [place]  * Scheduling expectation : Next Available w/[name]  *Length of Case:   *Clearance needed: yes, patient on midodrine for hypotension   *Anticoagulation Instructions: Hold all anticoagulants  *Aspirin Instructions: Hold Aspirin  *Post-op visit Date/Instructions:   TBD  *Diagnosis: Elevated PSA with PI-RADS 5 lesions x 3 on prostate MRI and Ill-defined nodular focus along the left posterolateral aspect of the urinary bladder with an adjacent 7 mm bladder calculus  *Procedure:    Diagnostic cystoscopy  with bilateral retrogrades, cystolitholapaxy, possible bladder biopsy and transrectal ultrasound guided prostate biopsy    Additional orders: N/A  -Admit type: OUTpatient  -Anesthesia: General  -VTE Prophylaxis Standing Order SCD's       Other:   -Standing Lab Orders Per Anesthesia    Lab other: None Facility will be obtaining a catheterized urinalysis, urine culture, BMP and CBC  -Standing Test orders EKG/Chest x-ray per Anesthesia       Test other:   - Medications:  Ancef 2gm IV

## 2022-02-01 ENCOUNTER — Telehealth: Payer: Self-pay | Admitting: *Deleted

## 2022-02-01 NOTE — Telephone Encounter (Signed)
Incoming message left on Triage from Sherri at [facility], stated she is unable to get a Cath UA sample, he was difficult to cath due to enlarged prostate and did not have a coude foley to use.

## 2022-02-01 NOTE — Telephone Encounter (Signed)
Scheduled appt with Kia at [facility]. Voiced understanding.

## 2022-02-06 ENCOUNTER — Ambulatory Visit: Payer: Medicare (Managed Care) | Admitting: Urology

## 2022-02-08 ENCOUNTER — Ambulatory Visit (INDEPENDENT_AMBULATORY_CARE_PROVIDER_SITE_OTHER): Payer: Medicare (Managed Care) | Admitting: Physician Assistant

## 2022-02-08 DIAGNOSIS — N210 Calculus in bladder: Secondary | ICD-10-CM

## 2022-02-08 DIAGNOSIS — R9389 Abnormal findings on diagnostic imaging of other specified body structures: Secondary | ICD-10-CM | POA: Diagnosis not present

## 2022-02-08 DIAGNOSIS — N3289 Other specified disorders of bladder: Secondary | ICD-10-CM | POA: Diagnosis not present

## 2022-02-08 LAB — URINALYSIS, COMPLETE
Bilirubin, UA: NEGATIVE
Glucose, UA: NEGATIVE
Ketones, UA: NEGATIVE
Leukocytes,UA: NEGATIVE
Nitrite, UA: NEGATIVE
Protein,UA: NEGATIVE
RBC, UA: NEGATIVE
Specific Gravity, UA: 1.025 (ref 1.005–1.030)
Urobilinogen, Ur: 0.2 mg/dL (ref 0.2–1.0)
pH, UA: 6.5 (ref 5.0–7.5)

## 2022-02-08 LAB — MICROSCOPIC EXAMINATION

## 2022-02-08 NOTE — Progress Notes (Signed)
Patient presented to clinic today to discuss recent test results, treatment plan, and for preop catheterized UA and culture.  He is accompanied today by his sister.  I explained to the patient that his recent prostate MRI revealed 3 PI-RADS category 5 lesions of the prostate, which is consistent with but not definitive for prostate cancer.  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  We discussed that there was no evidence of metastasis on PET scan, but there were findings of a bladder stone and possible bladder mass.  We discussed that his bladder mass on PET may represent bladder inflammation due to stone, true bladder mass, or intravesical protrusion of prostate cancer.  Based on this, she had and has worked with [name] and they are proposing proceeding to the OR for diagnostic cystoscopy with possible bladder biopsy, cystolitholapaxy, bilateral retrograde pyelograms, and transrectal prostate biopsy.  We discussed that this plan will allow for further evaluation of his prostate lesions as well as abnormal bladder findings and definitive treatment of his bladder stone.  We discussed the bilateral retrogrades will assess for upper tract lesions.  Additionally, doing these in the OR under anesthesia will allow for supervision of his blood pressure for added safety.  We discussed the risks of the proposed procedure including bleeding in the urine, stool, or ejaculate; infection; or damage to surrounding structures.  We discussed that particularly following prostate biopsy, there is a low but serious risk for sepsis and that if he develops a fever within 72 hours postoperatively, he should proceed immediately to the emergency department for IV antibiotics.  Patient expressed understanding and is in  agreement with this plan.  Cath UA obtained for preop urinalysis and culture, see separate procedure note for details.  He met with Gerald Leitz today to discuss preop planning.  Debroah Loop, PA-C 02/08/22 2:50 PM . I spent 20 minutes on the day of the encounter to include pre-visit record review, face-to-face time with the patient, and post-visit ordering of tests.

## 2022-02-08 NOTE — H&P (View-Only) (Signed)
Patient presented to clinic today to discuss recent test results, treatment plan, and for preop catheterized UA and culture.  He is accompanied today by his sister.  I explained to the patient that his recent prostate MRI revealed 3 PI-RADS category 5 lesions of the prostate, which is consistent with but not definitive for prostate cancer.  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  We discussed that there was no evidence of metastasis on PET scan, but there were findings of a bladder stone and possible bladder mass.  We discussed that his bladder mass on PET may represent bladder inflammation due to stone, true bladder mass, or intravesical protrusion of prostate cancer.  Based on this, she had and has worked with [name] and they are proposing proceeding to the OR for diagnostic cystoscopy with possible bladder biopsy, cystolitholapaxy, bilateral retrograde pyelograms, and transrectal prostate biopsy.  We discussed that this plan will allow for further evaluation of his prostate lesions as well as abnormal bladder findings and definitive treatment of his bladder stone.  We discussed the bilateral retrogrades will assess for upper tract lesions.  Additionally, doing these in the OR under anesthesia will allow for supervision of his blood pressure for added safety.  We discussed the risks of the proposed procedure including bleeding in the urine, stool, or ejaculate; infection; or damage to surrounding structures.  We discussed that particularly following prostate biopsy, there is a low but serious risk for sepsis and that if he develops a fever within 72 hours postoperatively, he should proceed immediately to the emergency department for IV antibiotics.  Patient expressed understanding and is in  agreement with this plan.  Cath UA obtained for preop urinalysis and culture, see separate procedure note for details.  He met with Gerald Leitz today to discuss preop planning.  Debroah Loop, PA-C 02/08/22 2:50 PM . I spent 20 minutes on the day of the encounter to include pre-visit record review, face-to-face time with the patient, and post-visit ordering of tests.

## 2022-02-09 ENCOUNTER — Encounter: Payer: Medicare (Managed Care) | Admitting: Physician Assistant

## 2022-02-09 DIAGNOSIS — L89153 Pressure ulcer of sacral region, stage 3: Secondary | ICD-10-CM | POA: Diagnosis not present

## 2022-02-09 NOTE — Progress Notes (Signed)
[name], [name] ([id number]) Visit Report for 02/09/2022 Chief Complaint Document Details Patient Name: [name], [name] Date of Service: 02/09/2022 11:00 AM Medical Record Number: [id number] Patient Account Number: [id number] Date of Birth/Sex: 21-Nov-1955 (66 y.o. M) Treating RN: [name] Primary Care Provider: Leward [name] Other Clinician: Referring Provider: Leward [name] Treating Provider/Extender: [name] in Treatment: 7 Information Obtained from: Patient Chief Complaint Sacral pressure ulcer Electronic Signature(s) Signed: 02/09/2022 10:42:45 AM By: Worthy Keeler PA-C Entered By: Worthy Keeler on 02/09/2022 10:42:45 [facility], [name] ([id number]) -------------------------------------------------------------------------------- Problem List Details Patient Name: [name] Date of Service: 02/09/2022 11:00 AM Medical Record Number: [id number] Patient Account Number: [id number] Date of Birth/Sex: 01/28/1956 (65 y.o. M) Treating RN: [name] Primary Care Provider: Leward [name] Other Clinician: Referring Provider: Leward [name] Treating Provider/Extender: [name] in Treatment: 7 Active Problems ICD-10 Encounter Code Description Active Date MDM Diagnosis L89.153 Pressure ulcer of sacral region, stage 3 12/18/2021 No Yes G82.54 Quadriplegia, C5-C7 incomplete 12/18/2021 No Yes I48.0 Paroxysmal atrial fibrillation 12/18/2021 No Yes I95.89 Other hypotension 12/18/2021 No Yes Inactive Problems Resolved Problems Electronic Signature(s) Signed: 02/09/2022 10:42:41 AM By: Worthy Keeler PA-C Entered By: Worthy Keeler on 02/09/2022 10:42:41

## 2022-02-09 NOTE — Progress Notes (Signed)
In and Out Catheterization  Patient is present today for a I & O catheterization due to urine sample. Patient was cleaned and prepped in a sterile fashion with betadine . A 14FR coude cath was inserted no complications were noted , 420m of urine return was noted, urine was yellow in color. A clean urine sample was collected for Urine sample. Bladder was drained  And catheter was removed with out difficulty.    Performed by: RVerlene Mayer CMarina

## 2022-02-09 NOTE — Progress Notes (Addendum)
KAMAURI, DENARDO ([id number]) Visit Report for 02/09/2022 Arrival Information Details Patient Name: [name], [name] Date of Service: 02/09/2022 11:00 AM Medical Record Number: [id number] Patient Account Number: [id number] Date of Birth/Sex: 17-Jan-1956 (66 y.o. M) Treating RN: Carlene Coria Primary Care Deborra Phegley: Leward Quan Other Clinician: Referring Dennis Hegeman: Leward Quan Treating Carol Loftin/Extender: Skipper Cliche in Treatment: 7 Visit Information History Since Last Visit All ordered tests and consults were completed: No Patient Arrived: Wheel Chair Added or deleted any medications: No Arrival Time: 11:07 Any new allergies or adverse reactions: No Accompanied By: sister Had a fall or experienced change in No Transfer Assistance: Transfer Board activities of daily living that may affect Patient Identification Verified: Yes risk of falls: Secondary Verification Process Completed: Yes Signs or symptoms of abuse/neglect since last visito No Patient Requires Transmission-Based Precautions: No Hospitalized since last visit: No Patient Has Alerts: Yes Implantable device outside of the clinic excluding No Patient Alerts: NOT DIABETIC cellular tissue based products placed in the center since last visit: Has Dressing in Place as Prescribed: Yes Pain Present Now: No Notes patient requires hoyer lift in the future for transfer for patient and staff safety. Electronic Signature(s) Signed: 02/09/2022 11:40:39 AM By: Carlene Coria RN Previous Signature: 02/09/2022 11:08:24 AM Version By: Carlene Coria RN Entered By: Carlene Coria on 02/09/2022 11:40:39 [facility], [place] ([id number]) -------------------------------------------------------------------------------- Clinic Level of Care Assessment Details Patient Name: [name] Date of Service: 02/09/2022 11:00 AM Medical Record Number: [id number] Patient Account Number: [id number] Date of Birth/Sex: 1955/10/03 (65 y.o. M) Treating RN: Carlene Coria Primary Care Deakon Frix: Leward Quan Other Clinician: Referring Jamesyn Moorefield: Leward Quan Treating Brion Sossamon/Extender: Skipper Cliche in Treatment: 7 Clinic Level of Care Assessment Items TOOL 4 Quantity Score X - Use when only an EandM is performed on FOLLOW-UP visit 1 0 ASSESSMENTS - Nursing Assessment / Reassessment X - Reassessment of Co-morbidities (includes updates in patient status) 1 10 X- 1 5 Reassessment of Adherence to Treatment Plan ASSESSMENTS - Wound and Skin Assessment / Reassessment X - Simple Wound Assessment / Reassessment - one wound 1 5 '[]'  - 0 Complex Wound Assessment / Reassessment - multiple wounds '[]'  - 0 Dermatologic / Skin Assessment (not related to wound area) ASSESSMENTS - Focused Assessment '[]'  - Circumferential Edema Measurements - multi extremities 0 '[]'  - 0 Nutritional Assessment / Counseling / Intervention '[]'  - 0 Lower Extremity Assessment (monofilament, tuning fork, pulses) '[]'  - 0 Peripheral Arterial Disease Assessment (using hand held doppler) ASSESSMENTS - Ostomy and/or Continence Assessment and Care '[]'  - Incontinence Assessment and Management 0 '[]'  - 0 Ostomy Care Assessment and Management (repouching, etc.) PROCESS - Coordination of Care X - Simple Patient / Family Education for ongoing care 1 15 '[]'  - 0 Complex (extensive) Patient / Family Education for ongoing care '[]'  - 0 Staff obtains Programmer, systems, Records, Test Results / Process Orders '[]'  - 0 Staff telephones HHA, Nursing Homes / Clarify orders / etc '[]'  - 0 Routine Transfer to another Facility (non-emergent condition) '[]'  - 0 Routine Hospital Admission (non-emergent condition) '[]'  - 0 New Admissions / Biomedical engineer / Ordering NPWT, Apligraf, etc. '[]'  - 0 Emergency Hospital Admission (emergent condition) X- 1 10 Simple Discharge Coordination '[]'  - 0 Complex (extensive) Discharge Coordination PROCESS - Special Needs '[]'  - Pediatric / Minor Patient Management 0 '[]'  - 0 Isolation  Patient Management '[]'  - 0 Hearing / Language / Visual special needs '[]'  - 0 Assessment of Community assistance (transportation, D/C planning, etc.) '[]'  - 0 Additional assistance / Altered mentation '[]'  -  0 Support Surface(s) Assessment (bed, cushion, seat, etc.) INTERVENTIONS - Wound Cleansing / Measurement Paulos, Felder ([id number]) X- 1 5 Simple Wound Cleansing - one wound '[]'  - 0 Complex Wound Cleansing - multiple wounds X- 1 5 Wound Imaging (photographs - any number of wounds) '[]'  - 0 Wound Tracing (instead of photographs) X- 1 5 Simple Wound Measurement - one wound '[]'  - 0 Complex Wound Measurement - multiple wounds INTERVENTIONS - Wound Dressings X - Small Wound Dressing one or multiple wounds 1 10 '[]'  - 0 Medium Wound Dressing one or multiple wounds '[]'  - 0 Large Wound Dressing one or multiple wounds '[]'  - 0 Application of Medications - topical '[]'  - 0 Application of Medications - injection INTERVENTIONS - Miscellaneous '[]'  - External ear exam 0 '[]'  - 0 Specimen Collection (cultures, biopsies, blood, body fluids, etc.) '[]'  - 0 Specimen(s) / Culture(s) sent or taken to Lab for analysis '[]'  - 0 Patient Transfer (multiple staff / Civil Service fast streamer / Similar devices) '[]'  - 0 Simple Staple / Suture removal (25 or less) '[]'  - 0 Complex Staple / Suture removal (26 or more) '[]'  - 0 Hypo / Hyperglycemic Management (close monitor of Blood Glucose) '[]'  - 0 Ankle / Brachial Index (ABI) - do not check if billed separately X- 1 5 Vital Signs Has the patient been seen at the hospital within the last three years: Yes Total Score: 75 Level Of Care: New/Established - Level 2 Electronic Signature(s) Unsigned Entered ByCarlene Coria on 02/09/2022 11:40:59 Signature(s): Date(s): [name] ([id number]) -------------------------------------------------------------------------------- Lower Extremity Assessment Details Patient Name: [name] Date of Service: 02/09/2022 11:00  AM Medical Record Number: [id number] Patient Account Number: [id number] Date of Birth/Sex: February 02, 1956 (66 y.o. M) Treating RN: Carlene Coria Primary Care Maevis Mumby: Leward Quan Other Clinician: Referring Monisha Siebel: Leward Quan Treating Jess Toney/Extender: Skipper Cliche in Treatment: 7 Electronic Signature(s) Signed: 02/09/2022 11:09:40 AM By: Carlene Coria RN Entered By: Carlene Coria on 02/09/2022 11:09:39 [facility], [place] ([id number]) -------------------------------------------------------------------------------- Multi Wound Chart Details Patient Name: [name] Date of Service: 02/09/2022 11:00 AM Medical Record Number: [id number] Patient Account Number: [id number] Date of Birth/Sex: [birth year] (65 y.o. M) Treating RN: Carlene Coria Primary Care Pallas Wahlert: Leward Quan Other Clinician: Referring Quante Pettry: Leward Quan Treating Lazaro Isenhower/Extender: Skipper Cliche in Treatment: 7 Vital Signs Height(in): 76 Pulse(bpm): 88 Weight(lbs): 167 Blood Pressure(mmHg): 98/70 Body Mass Index(BMI): 20.3 Temperature(F): 97.8 Respiratory Rate(breaths/min): 18 Photos: [N/A:N/A] Wound Location: Midline Sacrum N/A N/A Wounding Event: Pressure Injury N/A N/A Primary Etiology: Pressure Ulcer N/A N/A Comorbid History: Arrhythmia, Hypotension, History of N/A N/A pressure wounds, Quadriplegia Date Acquired: 10/18/2021 N/A N/A Weeks of Treatment: 7 N/A N/A Wound Status: Open N/A N/A Wound Recurrence: No N/A N/A Measurements L x W x D (cm) 5x0.5x0.1 N/A N/A Area (cm) : 1.963 N/A N/A Volume (cm) : 0.196 N/A N/A % Reduction in Area: 15.00% N/A N/A % Reduction in Volume: 57.60% N/A N/A Classification: Category/Stage III N/A N/A Exudate Amount: Medium N/A N/A Exudate Type: Serous N/A N/A Exudate Color: amber N/A N/A Wound Margin: Flat and Intact N/A N/A Granulation Amount: Large (67-100%) N/A N/A Granulation Quality: Red N/A N/A Necrotic Amount: Small (1-33%) N/A N/A Exposed Structures: Fat Layer  (Subcutaneous Tissue): N/A N/A Yes Fascia: No Tendon: No Muscle: No Joint: No Bone: No Epithelialization: None N/A N/A Treatment Notes Electronic Signature(s) Signed: 02/09/2022 11:09:52 AM By: Carlene Coria RN Entered By: Carlene Coria on 02/09/2022 11:09:51 [facility], [place] ([id number]) -------------------------------------------------------------------------------- Multi-Disciplinary Care Plan Details Patient Name: [name] Date of Service: 02/09/2022  11:00 AM Medical Record Number: [id number] Patient Account Number: [id number] Date of Birth/Sex: Nov 11, 1955 (66 y.o. M) Treating RN: Carlene Coria Primary Care Reneshia Zuccaro: Leward Quan Other Clinician: Referring Osborn Pullin: Leward Quan Treating Theadora Noyes/Extender: Skipper Cliche in Treatment: 7 Active Inactive Pressure Nursing Diagnoses: Knowledge deficit related to management of pressures ulcers Goals: Patient will remain free from development of additional pressure ulcers Date Initiated: 12/18/2021 Target Resolution Date: 12/18/2021 Goal Status: Active Patient will remain free of pressure ulcers Date Initiated: 12/18/2021 Target Resolution Date: 12/18/2021 Goal Status: Active Patient/caregiver will verbalize risk factors for pressure ulcer development Date Initiated: 12/18/2021 Target Resolution Date: 12/18/2021 Goal Status: Active Patient/caregiver will verbalize understanding of pressure ulcer management Date Initiated: 12/18/2021 Target Resolution Date: 12/18/2021 Goal Status: Active Interventions: Assess: immobility, friction, shearing, incontinence upon admission and as needed Assess offloading mechanisms upon admission and as needed Assess potential for pressure ulcer upon admission and as needed Provide education on pressure ulcers Treatment Activities: Patient referred for seating evaluation to ensure proper offloading : 12/18/2021 Notes: Wound/Skin Impairment Nursing Diagnoses: Impaired tissue integrity Knowledge deficit  related to smoking impact on wound healing Knowledge deficit related to ulceration/compromised skin integrity Goals: Patient/caregiver will verbalize understanding of skin care regimen Date Initiated: 12/18/2021 Date Inactivated: 01/25/2022 Target Resolution Date: 12/18/2021 Goal Status: Met Ulcer/skin breakdown will have a volume reduction of 30% by week 4 Date Initiated: 12/18/2021 Target Resolution Date: 01/15/2022 Goal Status: Active Interventions: Assess ulceration(s) every visit Treatment Activities: Referred to DME Satchel Heidinger for dressing supplies : 12/18/2021 Skin care regimen initiated : 12/18/2021 Topical wound management initiated : 12/18/2021 TIEGAN, TERPSTRA ([id number]) Notes: Electronic Signature(s) Signed: 02/09/2022 11:09:45 AM By: Carlene Coria RN Entered By: Carlene Coria on 02/09/2022 11:09:44 [place], Ronnald ([id number]) -------------------------------------------------------------------------------- Pain Assessment Details Patient Name: [name] Date of Service: 02/09/2022 11:00 AM Medical Record Number: [id number] Patient Account Number: [id number] Date of Birth/Sex: [birth year] (66 y.o. M) Treating RN: Carlene Coria Primary Care Jersey Espinoza: Leward Quan Other Clinician: Referring Aubriel Khanna: Leward Quan Treating Reagyn Facemire/Extender: Skipper Cliche in Treatment: 7 Active Problems Location of Pain Severity and Description of Pain Patient Has Paino No Site Locations Pain Management and Medication Current Pain Management: Electronic Signature(s) Signed: 02/09/2022 11:08:55 AM By: Carlene Coria RN Entered By: Carlene Coria on 02/09/2022 11:08:55 [facility], [place] ([id number]) -------------------------------------------------------------------------------- Patient/Caregiver Education Details Patient Name: [name] Date of Service: 02/09/2022 11:00 AM Medical Record Number: [id number] Patient Account Number: [id number] Date of Birth/Gender: 1956-05-20 (65 y.o. M) Treating RN: Carlene Coria Primary Care Physician: Leward Quan Other Clinician: Referring Physician: Leward Quan Treating Physician/Extender: Skipper Cliche in Treatment: 7 Education Assessment Education Provided To: Patient Education Topics Provided Pressure: Methods: Explain/Verbal Responses: State content correctly Electronic Signature(s) Unsigned Entered ByCarlene Coria on 02/09/2022 11:41:12 Signature(s): Date(s): [name] ([id number]) -------------------------------------------------------------------------------- Wound Assessment Details Patient Name: ATHENS, LEBEAU Date of Service: 02/09/2022 11:00 AM Medical Record Number: [id number] Patient Account Number: [id number] Date of Birth/Sex: 03-06-1956 (66 y.o. M) Treating RN: Carlene Coria Primary Care Koda Routon: Leward Quan Other Clinician: Referring Yarelly Kuba: Leward Quan Treating Konner Warrior/Extender: Skipper Cliche in Treatment: 7 Wound Status Wound Number: 1 Primary Pressure Ulcer Etiology: Wound Location: Midline Sacrum Wound Status: Open Wounding Event: Pressure Injury Comorbid Arrhythmia, Hypotension, History of pressure wounds, Date Acquired: 10/18/2021 History: Quadriplegia Weeks Of Treatment: 7 Clustered Wound: No Photos Wound Measurements Length: (cm) 5 Width: (cm) 0.5 Depth: (cm) 0.1 Area: (cm) 1.963 Volume: (cm) 0.196 % Reduction in Area: 15% % Reduction in Volume: 57.6% Epithelialization: None Tunneling: No Undermining: No  Wound Description Classification: Category/Stage III Wound Margin: Flat and Intact Exudate Amount: Medium Exudate Type: Serous Exudate Color: amber Foul Odor After Cleansing: No Slough/Fibrino Yes Wound Bed Granulation Amount: Large (67-100%) Exposed Structure Granulation Quality: Red Fascia Exposed: No Necrotic Amount: Small (1-33%) Fat Layer (Subcutaneous Tissue) Exposed: Yes Necrotic Quality: Adherent Slough Tendon Exposed: No Muscle Exposed: No Joint Exposed: No Bone Exposed:  No Electronic Signature(s) Signed: 02/09/2022 11:09:22 AM By: Carlene Coria RN Entered By: Carlene Coria on 02/09/2022 11:09:22 Kreisler, [name] ([id number]) -------------------------------------------------------------------------------- Vitals Details Patient Name: [name] Date of Service: 02/09/2022 11:00 AM Medical Record Number: [id number] Patient Account Number: [id number] Date of Birth/Sex: 1956/05/04 (65 y.o. M) Treating RN: Carlene Coria Primary Care Uriyah Raska: Leward Quan Other Clinician: Referring Mont Jagoda: Leward Quan Treating Shavonn Convey/Extender: Skipper Cliche in Treatment: 7 Vital Signs Time Taken: 11:05 Temperature (F): 97.8 Height (in): 76 Pulse (bpm): 88 Weight (lbs): 167 Respiratory Rate (breaths/min): 18 Body Mass Index (BMI): 20.3 Blood Pressure (mmHg): 98/70 Reference Range: 80 - 120 mg / dl Electronic Signature(s) Signed: 02/09/2022 11:08:50 AM By: Carlene Coria RN Previous Signature: 02/09/2022 11:08:32 AM Version By: Carlene Coria RN Entered By: Carlene Coria on 02/09/2022 11:08:50

## 2022-02-11 LAB — CULTURE, URINE COMPREHENSIVE

## 2022-02-12 NOTE — Progress Notes (Signed)
[facility] Posting Form   Surgery Date/Time: Date: 02/27/2022  Surgeon: Dr. John Giovanni, MD  Surgery Location: Day Surgery  Inpt ( No  )   Outpt (Yes)   Obs ( No  )   Diagnosis: Elevated PSA R97.20, Bladder Stone N21.0  -CPT: [id number], [place], [id number]. [id number], [id number], [id number], [id number]  Surgery: Transrectal Ultrasound Guided Prostate Biopsy, Diagnostic Cystoscopy with bilateral retrogrades, Cystolitholapaxy, Possible Bladder Biopsy  Stop Anticoagulations: Yes and hold ASA  Cardiac/Medical/Pulmonary Clearance needed: yes  Clearance needed from Dr: Wilford Grist  Clearance request sent on: Date: 02/12/22  *Orders entered into EPIC  Date: 02/12/22   *Case booked in [facility]  02/09/2022  *Notified pt of Surgery: Date: 02/09/2022  PRE-OP UA & CX: Yes, obtained UA and Cx, Will obtain CBC and BMP at PAT  *Placed into Prior Authorization Work Que Date: 02/12/22   Assistant/laser/rep:No

## 2022-02-13 ENCOUNTER — Telehealth: Payer: Self-pay

## 2022-02-13 NOTE — Telephone Encounter (Signed)
I spoke with [name]. We have discussed possible surgery dates and Tuesday September 12th, 2023 was agreed upon by all parties. Patient given information about surgery date, what to expect pre-operatively and post operatively.  We discussed that a Pre-Admission Testing office will be calling to set up the pre-op visit that will take place prior to surgery, and that these appointments are typically done over the phone with a Pre-Admissions RN.  Informed patient that our office will communicate any additional care to be provided after surgery. Patients questions or concerns were discussed during our call. Advised to call our office should there be any additional information, questions or concerns that arise. Patient verbalized understanding.

## 2022-02-14 ENCOUNTER — Other Ambulatory Visit: Payer: Self-pay | Admitting: *Deleted

## 2022-02-14 MED ORDER — AMOXICILLIN 875 MG PO TABS: 875.0000 mg | ORAL_TABLET | Freq: Two times a day (BID) | ORAL | 0 refills | Status: AC

## 2022-02-21 ENCOUNTER — Encounter
Admission: RE | Admit: 2022-02-21 | Discharge: 2022-02-21 | Disposition: A | Discharge status: Home / Self Care | Payer: Medicare (Managed Care) | Source: Ambulatory Visit | Attending: Urology | Admitting: Urology

## 2022-02-21 NOTE — Patient Instructions (Addendum)
Your procedure is scheduled on: Tuesday February 27, 2022. Report to Day Surgery inside [facility] 2nd floor, stop by registration desk before getting on elevator.  To find out your arrival time please call [phone] between 1PM - 3PM on Monday February 26, 2022.  Remember: Instructions that are not followed completely may result in serious medical risk,  up to and including death, or upon the discretion of your surgeon and anesthesiologist your  surgery may need to be rescheduled.     _X__ 1. Do not eat food or drink fluids after midnight the night before your procedure.                 No chewing gum or hard candies.   __X__2.  On the morning of surgery brush your teeth with toothpaste and water, you                may rinse your mouth with mouthwash if you wish.  Do not swallow any toothpaste or mouthwash.     _X__ 3.  No Alcohol for 24 hours before or after surgery.   _X__ 4.  Do Not Smoke or use e-cigarettes For 24 Hours Prior to Your Surgery.                 Do not use any chewable tobacco products for at least 6 hours prior to                 Surgery.  _X__  5.  Do not use any recreational drugs (marijuana, cocaine, heroin, ecstasy, MDMA or other)                For at least one week prior to your surgery.  Combination of these drugs with anesthesia                May have life threatening results.  ____  6.  Bring all medications with you on the day of surgery if instructed.   __X__  7.  Notify your doctor if there is any change in your medical condition      (cold, fever, infections).     Do not wear jewelry, make-up, hairpins, clips or nail polish. Do not wear lotions, powders, or perfumes. You may wear deodorant. Do not shave 48 hours prior to surgery. Men may shave face and neck. Do not bring valuables to the hospital.    [facility] is not responsible for any belongings or valuables.  Contacts, dentures or bridgework may not be worn  into surgery. Leave your suitcase in the car. After surgery it may be brought to your room. For patients admitted to the hospital, discharge time is determined by your treatment team.   Patients discharged the day of surgery will not be allowed to drive home.   Make arrangements for someone to be with you for the first 24 hours of your Same Day Discharge.   __X__ Take these medicines the morning of surgery with A SIP OF WATER:    1. diltiazem (CARDIZEM) 60 MG (if needed)   2.   3.   4.  5.  6.  __X__ Fleet Enema (as directed) morning of procedure as soon as you wake up   __X__ Use CHG Soap (or wipes) as directed  ____ Use Benzoyl Peroxide Gel as instructed  ____ Use inhalers on the day of surgery  ____ Stop metformin 2 days prior to surgery    ____ Take 1/2 of usual insulin  dose the night before surgery. No insulin the morning          of surgery.   ____ Call your PCP, cardiologist, or Pulmonologist if taking Coumadin/Plavix/aspirin and ask when to stop before your surgery.   __X__ One Week prior to surgery- Stop Anti-inflammatories such as Ibuprofen, Aleve, Advil, Motrin, meloxicam (MOBIC), diclofenac, etodolac, ketorolac, Toradol, Daypro, piroxicam, Goody's or BC powders. OK TO USE TYLENOL IF NEEDED   __X__ Do not start any new vitamins and or supplements until after surgery.    ____ Bring C-Pap to the hospital.    If you have any questions regarding your pre-procedure instructions,  Please call Pre-admit Testing at [phone]

## 2022-02-22 ENCOUNTER — Encounter
Admission: RE | Admit: 2022-02-22 | Discharge: 2022-02-22 | Disposition: A | Discharge status: Home / Self Care | Payer: Medicare (Managed Care) | Source: Ambulatory Visit | Attending: Urology | Admitting: Urology

## 2022-02-22 DIAGNOSIS — R972 Elevated prostate specific antigen [PSA]: Secondary | ICD-10-CM | POA: Insufficient documentation

## 2022-02-22 DIAGNOSIS — Z01812 Encounter for preprocedural laboratory examination: Secondary | ICD-10-CM | POA: Insufficient documentation

## 2022-02-22 DIAGNOSIS — N210 Calculus in bladder: Secondary | ICD-10-CM | POA: Insufficient documentation

## 2022-02-22 HISTORY — DX: Hyperlipidemia, unspecified: E78.5

## 2022-02-22 HISTORY — DX: Dependence on wheelchair: Z99.3

## 2022-02-22 HISTORY — DX: Deficiency of other specified B group vitamins: E53.8

## 2022-02-22 HISTORY — DX: Vitamin D deficiency, unspecified: E55.9

## 2022-02-22 HISTORY — DX: Paroxysmal atrial fibrillation: I48.0

## 2022-02-22 HISTORY — DX: Neuromuscular dysfunction of bladder, unspecified: N31.9

## 2022-02-22 HISTORY — DX: Olecranon bursitis, unspecified elbow: M70.20

## 2022-02-22 HISTORY — DX: Calculus in bladder: N21.0

## 2022-02-22 HISTORY — DX: Pressure ulcer of sacral region, unspecified stage: L89.159

## 2022-02-22 HISTORY — DX: Unspecified injury at C5 level of cervical spinal cord, initial encounter: S14.105A

## 2022-02-22 HISTORY — DX: Hypotension, unspecified: I95.9

## 2022-02-22 HISTORY — DX: Constipation, unspecified: K59.00

## 2022-02-22 HISTORY — DX: Anemia, unspecified: D64.9

## 2022-02-22 HISTORY — DX: Elevated prostate specific antigen (PSA): R97.20

## 2022-02-22 HISTORY — DX: Atherosclerosis of aorta: I70.0

## 2022-02-22 HISTORY — DX: Cyst of kidney, acquired: N28.1

## 2022-02-22 HISTORY — DX: Quadriplegia, C5-C7 incomplete: G82.54

## 2022-02-22 LAB — CBC
HCT: 41.4 % (ref 39.0–52.0)
Hemoglobin: 13.2 g/dL (ref 13.0–17.0)
MCH: 27.8 pg (ref 26.0–34.0)
MCHC: 31.9 g/dL (ref 30.0–36.0)
MCV: 87.3 fL (ref 80.0–100.0)
Platelets: 196 10*3/uL (ref 150–400)
RBC: 4.74 MIL/uL (ref 4.22–5.81)
RDW: 12.5 % (ref 11.5–15.5)
WBC: 4.1 10*3/uL (ref 4.0–10.5)
nRBC: 0 % (ref 0.0–0.2)

## 2022-02-22 LAB — BASIC METABOLIC PANEL
Anion gap: 7 (ref 5–15)
BUN: 14 mg/dL (ref 8–23)
CO2: 28 mmol/L (ref 22–32)
Calcium: 8.7 mg/dL — ABNORMAL LOW (ref 8.9–10.3)
Chloride: 105 mmol/L (ref 98–111)
Creatinine, Ser: 0.62 mg/dL (ref 0.61–1.24)
GFR, Estimated: >60 mL/min (ref >60)
Glucose, Bld: 106 mg/dL — ABNORMAL HIGH (ref 70–99)
Potassium: 3.6 mmol/L (ref 3.5–5.1)
Sodium: 140 mmol/L (ref 135–145)

## 2022-02-23 ENCOUNTER — Encounter: Payer: Medicare (Managed Care) | Attending: Physician Assistant | Admitting: Physician Assistant

## 2022-02-23 ENCOUNTER — Other Ambulatory Visit: Payer: Self-pay | Admitting: Urology

## 2022-02-23 ENCOUNTER — Encounter: Payer: Self-pay | Admitting: Urgent Care

## 2022-02-23 DIAGNOSIS — R3989 Other symptoms and signs involving the genitourinary system: Secondary | ICD-10-CM

## 2022-02-23 DIAGNOSIS — G8254 Quadriplegia, C5-C7 incomplete: Secondary | ICD-10-CM | POA: Diagnosis not present

## 2022-02-23 DIAGNOSIS — L89153 Pressure ulcer of sacral region, stage 3: Secondary | ICD-10-CM | POA: Diagnosis present

## 2022-02-23 DIAGNOSIS — I480 Paroxysmal atrial fibrillation: Secondary | ICD-10-CM | POA: Diagnosis not present

## 2022-02-23 MED ORDER — AMOXICILLIN-POT CLAVULANATE 875-125 MG PO TABS: 1.0000 | ORAL_TABLET | Freq: Two times a day (BID) | ORAL | 0 refills | Status: DC

## 2022-02-23 NOTE — Progress Notes (Addendum)
CULVER, FEIGHNER ([id number]) Visit Report for 02/23/2022 Chief Complaint Document Details Patient Name: [name], [name] Date of Service: 02/23/2022 11:45 AM Medical Record Number: [id number] Patient Account Number: [id number] Date of Birth/Sex: 1955/11/23 (66 y.o. M) Treating RN: Carlene Coria Primary Care Provider: Leward Quan Other Clinician: Referring Provider: Leward Quan Treating Provider/Extender: Skipper Cliche in Treatment: 9 Information Obtained from: Patient Chief Complaint Sacral pressure ulcer Electronic Signature(s) Signed: 02/23/2022 11:37:07 AM By: Worthy Keeler PA-C Entered By: Worthy Keeler on 02/23/2022 11:37:[address] [facility] [place], [place] ([id number]) -------------------------------------------------------------------------------- HPI Details Patient Name: [name] Date of Service: 02/23/2022 11:45 AM Medical Record Number: [id number] Patient Account Number: [id number] Date of Birth/Sex: [birth year] (65 y.o. M) Treating RN: Carlene Coria Primary Care Provider: Leward Quan Other Clinician: Referring Provider: Leward Quan Treating Provider/Extender: Skipper Cliche in Treatment: 9 History of Present Illness HPI Description: 12-18-2021 upon evaluation today patient presents for initial evaluation here in the clinic concerning an issue he has been having over the sacral area where he has a wound this is a stage III pressure ulcer. It also appears there may still be some moisture component to this based on what I am seeing. I do not see any evidence of infection but at the same time I do believe that the patient is in need of some additional and hopefully better dressings in order to help this heal more appropriately and quickly. Currently just a protective dressing has been used over this sounds like may possibly be a border foam. The patient did have a CT scan which was negative for osteomyelitis he had a previous wound above this in the sacral region but not necessarily right of this  area. 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 He also has atrial fibrillation and hypotension. 01-01-2022 upon evaluation today patient appears to be doing well with regard to his wound actually see some signs of improvement there is still quite a bit of feeling to be done here but nonetheless I think we are on the right track. I do not see any evidence of active infection locally or systemically at this time which is great news. 01-25-2022 upon evaluation today patient's wound really is not significantly better it actually appears about the same. Of note I do believe that part of the issue here is probably the fact that honestly it is hard to keep the dressing in place I think that when the alginate is put on it needs to be cut bigger than what is currently being seen on the dressing today probably by about 3 times that size and placed directly to the wound bed while spreading the butt cheeks bilaterally. This will allow for the dressing to be right intact with the wound bed so that when it does try to closing on itself it will actually fold on the alginate and not actually contact skin to skin. 02-09-2022 upon evaluation today patient appears to be doing about the same in regard to his wound. Again I do not think the dressings being quite put on correctly keeping the alginate in contact with the wound bed this just does not seem to be quite doing the job. I think we may need to switch to Dakin's moistened gauze dressing this is a very precarious location I am not even sure that he is not getting some feces into this dressing and underneath. I think it is probably going to be changed daily and switch again to the Dakin's. 02-23-2022 upon evaluation  today patient unfortunately still continues to have issues with his sacral area. Again this is a very precarious spot but honestly I think is made worse by the fact that he is up sitting the majority of the day. He  typically gets up by 10 he tells me and does not go to bed until around 11 or 12 this is a good solid at least 12 to 14 hours that he is up day in and day out. With that being said I think that this is probably one of the main reasons why this wound is not healing the other being obviously moisture control I do think the surface of the wound looks better this week compared to previous with the Dakin's but at the same time the wound itself does not appear to be any smaller. Electronic Signature(s) Signed: 02/23/2022 12:27:22 PM By: Worthy Keeler PA-C Entered By: Worthy Keeler on 02/23/2022 12:27:22 [facility], [name] ([id number]) -------------------------------------------------------------------------------- Physical Exam Details Patient Name: [name] Date of Service: 02/23/2022 11:45 AM Medical Record Number: [id number] Patient Account Number: [id number] Date of Birth/Sex: April 04, 1956 (65 y.o. M) Treating RN: Carlene Coria Primary Care Provider: Leward Quan Other Clinician: Referring Provider: Leward Quan Treating Provider/Extender: Skipper Cliche in Treatment: 59 Constitutional Well-nourished and well-hydrated in no acute distress. Respiratory normal breathing without difficulty. Psychiatric this patient is able to make decisions and demonstrates good insight into disease process. Alert and Oriented x 3. pleasant and cooperative. Notes Upon inspection patient's wound again did not require any sharp debridement it appears to be healthy but also appears to still be about the same size. Again I think offloading is the primary issue here. Electronic Signature(s) Signed: 02/23/2022 12:27:40 PM By: Worthy Keeler PA-C Entered By: Worthy Keeler on 02/23/2022 12:27:39 [facility], [place] ([id number]) -------------------------------------------------------------------------------- Physician Orders Details Patient Name: [name] Date of Service: 02/23/2022 11:45 AM Medical Record Number:  [id number] Patient Account Number: [id number] Date of Birth/Sex: [birth year] (65 y.o. M) Treating RN: Carlene Coria Primary Care Provider: Leward Quan Other Clinician: Referring Provider: Leward Quan Treating Provider/Extender: Skipper Cliche in Treatment: 9 Verbal / Phone Orders: No Diagnosis Coding ICD-10 Coding Code Description L89.153 Pressure ulcer of sacral region, stage 3 G82.54 Quadriplegia, C5-C7 incomplete I48.0 Paroxysmal atrial fibrillation I95.89 Other hypotension Follow-up Appointments Wound #1 Midline Sacrum o Return Appointment in 2 weeks. Home Health Wound #1 [place] for wound care. May utilize formulary equivalent dressing for wound treatment orders unless otherwise specified. Home Health Nurse may visit PRN to address patientos wound care needs. o Scheduled days for dressing changes to be completed; exception, patient has scheduled wound care visit that day. o **Please direct any NON-WOUND related issues/requests for orders to patient's Primary Care Physician. **If current dressing causes regression in wound condition, may D/C ordered dressing product/s and apply Normal Saline Moist Dressing daily until next [place] or Other MD appointment. **Notify Wound Healing Center of regression in wound condition at [phone]. Bathing/ Shower/ Hygiene o May shower; gently cleanse wound with antibacterial soap, rinse and pat dry prior to dressing wounds o No tub bath. Off-Loading o Roho cushion for wheelchair o Turn and reposition every 2 hours - do your best to not stay sitting in chair all day long, try your best to get pressure off your bottom at lease every three hours for 30 minutes at a time Additional Orders / Instructions o Follow Nutritious Diet and Increase Protein Intake  Wound Treatment Wound #1 - Sacrum Wound Laterality: Midline Cleanser: Soap and Water 1 x Per Day/30  Days Discharge Instructions: Gently cleanse wound with antibacterial soap, rinse and pat dry prior to dressing wounds Primary Dressing: Hydrofera Blue Ready Transfer Foam, 4x5 (in/in) (Dispense As Written) 1 x Per Day/30 Days Discharge Instructions: Apply Hydrofera Blue Ready to wound bed as directed Secondary Dressing: (BORDER) Zetuvit Plus SILICONE BORDER Dressing 4x4 (in/in) 1 x Per Day/30 Days Discharge Instructions: Please do not put silicone bordered dressings under wraps. Use non-bordered dressing only. When applying please make sure skin is pulled flat so dressing stays in place. Electronic Signature(s) Signed: 02/23/2022 1:07:28 PM By: Worthy Keeler PA-C Previous Signature: 02/23/2022 12:25:27 PM Version By: Carlene Coria RN Previous Signature: 02/23/2022 12:29:30 PM Version By: Worthy Keeler PA-C Entered By: Worthy Keeler on 02/23/2022 13:04:00 [facility], [place] ([id number]) [place], [name] ([id number]) -------------------------------------------------------------------------------- Problem List Details Patient Name: [name] Date of Service: 02/23/2022 11:45 AM Medical Record Number: [id number] Patient Account Number: [id number] Date of Birth/Sex: 05-Jul-1955 (65 y.o. M) Treating RN: Carlene Coria Primary Care Provider: Leward Quan Other Clinician: Referring Provider: Leward Quan Treating Provider/Extender: Skipper Cliche in Treatment: 9 Active Problems ICD-10 Encounter Code Description Active Date MDM Diagnosis L89.153 Pressure ulcer of sacral region, stage 3 12/18/2021 No Yes G82.54 Quadriplegia, C5-C7 incomplete 12/18/2021 No Yes I48.0 Paroxysmal atrial fibrillation 12/18/2021 No Yes I95.89 Other hypotension 12/18/2021 No Yes Inactive Problems Resolved Problems Electronic Signature(s) Signed: 02/23/2022 11:37:02 AM By: Worthy Keeler PA-C Entered By: Worthy Keeler on 02/23/2022 11:37:02 [place], Adrik  ([id number]) -------------------------------------------------------------------------------- Progress Note Details Patient Name: [name] Date of Service: 02/23/2022 11:45 AM Medical Record Number: [id number] Patient Account Number: [id number] Date of Birth/Sex: 1956-01-13 (65 y.o. M) Treating RN: Carlene Coria Primary Care Provider: Leward Quan Other Clinician: Referring Provider: Leward Quan Treating Provider/Extender: Skipper Cliche in Treatment: 9 Subjective Chief Complaint Information obtained from Patient Sacral pressure ulcer History of Present Illness (HPI) 12-18-2021 upon evaluation today patient presents for initial evaluation here in the clinic concerning an issue he has been having over the sacral area where he has a wound this is a stage III pressure ulcer. It also appears there may still be some moisture component to this based on what I am seeing. I do not see any evidence of infection but at the same time I do believe that the patient is in need of some additional and hopefully better dressings in order to help this heal more appropriately and quickly. Currently just a protective dressing has been used over this sounds like may possibly be a border foam. The patient did have a CT scan which was negative for osteomyelitis he had a previous wound above this in the sacral region but not necessarily right of this area. 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 He also has atrial fibrillation and hypotension. 01-01-2022 upon evaluation today patient appears to be doing well with regard to his wound actually see some signs of improvement there is still quite a bit of feeling to be done here but nonetheless I think we are on the right track. I do not see any evidence of active infection locally or systemically at this time which is great news. 01-25-2022 upon evaluation today patient's wound really is not significantly better it actually appears  about the same. Of note I do believe that part of the issue here is probably the fact that honestly it is  hard to keep the dressing in place I think that when the alginate is put on it needs to be cut bigger than what is currently being seen on the dressing today probably by about 3 times that size and placed directly to the wound bed while spreading the butt cheeks bilaterally. This will allow for the dressing to be right intact with the wound bed so that when it does try to closing on itself it will actually fold on the alginate and not actually contact skin to skin. 02-09-2022 upon evaluation today patient appears to be doing about the same in regard to his wound. Again I do not think the dressings being quite put on correctly keeping the alginate in contact with the wound bed this just does not seem to be quite doing the job. I think we may need to switch to Dakin's moistened gauze dressing this is a very precarious location I am not even sure that he is not getting some feces into this dressing and underneath. I think it is probably going to be changed daily and switch again to the Dakin's. 02-23-2022 upon evaluation today patient unfortunately still continues to have issues with his sacral area. Again this is a very precarious spot but honestly I think is made worse by the fact that he is up sitting the majority of the day. He typically gets up by 10 he tells me and does not go to bed until around 11 or 12 this is a good solid at least 12 to 14 hours that he is up day in and day out. With that being said I think that this is probably one of the main reasons why this wound is not healing the other being obviously moisture control I do think the surface of the wound looks better this week compared to previous with the Dakin's but at the same time the wound itself does not appear to be any smaller. Objective Constitutional Well-nourished and well-hydrated in no acute distress. Vitals Time Taken:  12:00 PM, Height: 76 in, Weight: 167 lbs, BMI: 20.3, Temperature: 98.7 F, Pulse: 68 bpm, Respiratory Rate: 18 breaths/min, Blood Pressure: 98/70 mmHg. Respiratory normal breathing without difficulty. Psychiatric this patient is able to make decisions and demonstrates good insight into disease process. Alert and Oriented x 3. pleasant and cooperative. General Notes: Upon inspection patient's wound again did not require any sharp debridement it appears to be healthy but also appears to still be about the same size. Again I think offloading is the primary issue here. Integumentary (Hair, Skin) Wound #1 status is Open. Original cause of wound was Pressure Injury. The date acquired was: 10/18/2021. The wound has been in treatment [facility], [place] ([id number]) weeks. The wound is located on the Midline Sacrum. The wound measures 5cm length x 0.5cm width x 0.3cm depth; 1.963cm^2 area and 0.589cm^3 volume. There is Fat Layer (Subcutaneous Tissue) exposed. There is no tunneling or undermining noted. There is a medium amount of serous drainage noted. The wound margin is flat and intact. There is large (67-100%) red granulation within the wound bed. There is a small (1-33%) amount of necrotic tissue within the wound bed including Adherent Slough. Assessment Active Problems ICD-10 Pressure ulcer of sacral region, stage 3 Quadriplegia, C5-C7 incomplete Paroxysmal atrial fibrillation Other hypotension Plan Follow-up Appointments: Wound #1 Midline Sacrum: Return Appointment in 2 weeks. Home Health: Wound #1 Midline Sacrum: [place]: [place] for wound care. May utilize formulary equivalent dressing for wound  treatment orders unless otherwise specified. Home Health Nurse may visit PRN to address patient s wound care needs. Scheduled days for dressing changes to be completed; exception, patient has scheduled wound care visit that day. **Please direct any NON-WOUND related  issues/requests for orders to patient's Primary Care Physician. **If current dressing causes regression in wound condition, may D/C ordered dressing product/s and apply Normal Saline Moist Dressing daily until next [place] or Other MD appointment. **Notify Wound Healing Center of regression in wound condition at [phone]. Bathing/ Shower/ Hygiene: May shower; gently cleanse wound with antibacterial soap, rinse and pat dry prior to dressing wounds No tub bath. Off-Loading: Roho cushion for wheelchair Turn and reposition every 2 hours - do your best to not stay sitting in chair all day long, try your best to get pressure off your bottom at lease every three hours for 30 minutes at a time Additional Orders / Instructions: Follow Nutritious Diet and Increase Protein Intake WOUND #1: - Sacrum Wound Laterality: Midline Cleanser: Soap and Water 1 x Per Day/30 Days Discharge Instructions: Gently cleanse wound with antibacterial soap, rinse and pat dry prior to dressing wounds Primary Dressing: Hydrofera Blue Ready Transfer Foam, 4x5 (in/in) (Dispense As Written) 1 x Per Day/30 Days Discharge Instructions: Apply Hydrofera Blue Ready to wound bed as directed Secondary Dressing: (BORDER) Zetuvit Plus SILICONE BORDER Dressing 4x4 (in/in) 1 x Per Day/30 Days Discharge Instructions: Please do not put silicone bordered dressings under wraps. Use non-bordered dressing only. When applying please make sure skin is pulled flat so dressing stays in place. 1. I would recommend currently that we go ahead and continue with the recommendation for offloading he really should be turning and repositioning at least every 2 hours. This means that he really needs to have an aide more than just first thing in the morning get up and then sit all day only to get back down in bed. He does not have a motorized chair so he is not really able to do any pressure relief and offloading and that he has a good  cushion but again I do not think that is enough for him still to set up for 12 to 14 hours a day. 2. Right to try to get in touch with [facility] in order to discuss this with them so we can see if there is any kind of plan that can be initiated to come up with in this regard. Again I think that he is going to otherwise need to spend more time in the bed and maybe even some days just stay in the bed so that he can give this a chance to try to heal. This will mean offloading changing positions from 1 side to the other every 2 hours and trying to keep pressure off. We will see patient back for reevaluation in 2 weeks here in the clinic. If anything worsens or changes patient will contact our office for additional recommendations. After the patient was gone actually had a discussion with Dr. Ronny Bacon who is the primary care provider with [facility]. Subsequently we discussed the patient's offloading and social factors which are prohibiting him from being able to heal. She also relayed to me they discontinued using the Dakin's solution which the patient did not even tell me because it looks wet and macerated. Again nonetheless we can make some changes here and we will subsequently switch to [facility] which I think will be a better option for him. That something that she states  they can definitely get and I made sure that they get the [facility] ready transfer. Subsequently also discussed the possibility of better ways to help with [facility] ([id number]) offloading as he does not have a lot of social help at home. Nonetheless she is going to look into that she also offered that he could come to [facility] more frequently as well I think that could be an option as well. She is going to see what she can do to try to help out on her and as well and then will make any adjustments as necessary going forward. Electronic Signature(s) Signed: 02/23/2022 1:07:18 PM By: Worthy Keeler PA-C Previous Signature: 02/23/2022  12:28:47 PM Version By: Worthy Keeler PA-C Entered By: Worthy Keeler on 02/23/2022 13:07:18 [facility], [place] ([id number]) -------------------------------------------------------------------------------- SuperBill Details Patient Name: [name] Date of Service: 02/23/2022 Medical Record Number: [id number] Patient Account Number: [id number] Date of Birth/Sex: Sep 15, 1955 (65 y.o. M) Treating RN: Carlene Coria Primary Care Provider: Leward Quan Other Clinician: Referring Provider: Leward Quan Treating Provider/Extender: Skipper Cliche in Treatment: 9 Diagnosis Coding ICD-10 Codes Code Description [phone] Pressure ulcer of sacral region, stage 3 G82.54 Quadriplegia, C5-C7 incomplete I48.0 Paroxysmal atrial fibrillation I95.89 Other hypotension Facility Procedures CPT4 Code: [id number] Description: [phone] - WOUND CARE VISIT-LEV 2 EST PT Modifier: Quantity: 1 Physician Procedures CPT4 Code: [id number] Description: 20721 - WC PHYS LEVEL 3 - EST PT Modifier: Quantity: 1 CPT4 Code: Description: ICD-10 Diagnosis Description L89.153 Pressure ulcer of sacral region, stage 3 G82.54 Quadriplegia, C5-C7 incomplete I48.0 Paroxysmal atrial fibrillation I95.89 Other hypotension Modifier: Quantity: Electronic Signature(s) Signed: 02/23/2022 12:28:58 PM By: Worthy Keeler PA-C Previous Signature: 02/23/2022 12:25:57 PM Version By: Carlene Coria RN Entered By: Worthy Keeler on 02/23/2022 12:28:58

## 2022-02-23 NOTE — Progress Notes (Signed)
Spoke w Magda Paganini, NP that works with Dr Wilford Grist. Notified her of msg. She expressed understanding.

## 2022-02-23 NOTE — Progress Notes (Signed)
Would you call [facility] and let them know that we put him on Augmentin to cover him until his surgery?

## 2022-02-23 NOTE — Addendum Note (Signed)
Addended by: Gerald Leitz A on: 02/23/2022 04:09 PM   Modules accepted: Orders

## 2022-02-23 NOTE — Progress Notes (Addendum)
ORESTE, MAJEED ([id number]) Visit Report for 02/23/2022 Arrival Information Details Patient Name: [name], [name] Date of Service: 02/23/2022 11:45 AM Medical Record Number: [id number] Patient Account Number: [id number] Date of Birth/Sex: [birth year] (66 y.o. M) Treating RN: Carlene Coria Primary Care Norena Bratton: Leward Quan Other Clinician: Referring Dewayne Severe: Leward Quan Treating Marguita Venning/Extender: Skipper Cliche in Treatment: 9 Visit Information History Since Last Visit All ordered tests and consults were completed: No Patient Arrived: Wheel Chair Added or deleted any medications: No Arrival Time: 12:03 Any new allergies or adverse reactions: No Accompanied By: caregiver Had a fall or experienced change in No Transfer Assistance: Civil Service fast streamer activities of daily living that may affect Patient Identification Verified: Yes risk of falls: Secondary Verification Process Completed: Yes Signs or symptoms of abuse/neglect since last visito No Patient Requires Transmission-Based Precautions: No Hospitalized since last visit: No Patient Has Alerts: Yes Implantable device outside of the clinic excluding No Patient Alerts: NOT DIABETIC cellular tissue based products placed in the center since last visit: Has Dressing in Place as Prescribed: Yes Pain Present Now: No Electronic Signature(s) Signed: 02/23/2022 12:03:31 PM By: Carlene Coria RN Entered By: Carlene Coria on 02/23/2022 12:03:31 Sather, Devyon ([id number]) -------------------------------------------------------------------------------- Clinic Level of Care Assessment Details Patient Name: [name] Date of Service: 02/23/2022 11:45 AM Medical Record Number: [id number] Patient Account Number: [id number] Date of Birth/Sex: 1956/03/30 (65 y.o. M) Treating RN: Carlene Coria Primary Care Champayne Kocian: Leward Quan Other Clinician: Referring Prakash Kimberling: Leward Quan Treating Milen Lengacher/Extender: Skipper Cliche in Treatment: 9 Clinic Level of Care  Assessment Items TOOL 4 Quantity Score X - Use when only an EandM is performed on FOLLOW-UP visit 1 0 ASSESSMENTS - Nursing Assessment / Reassessment X - Reassessment of Co-morbidities (includes updates in patient status) 1 10 X- 1 5 Reassessment of Adherence to Treatment Plan ASSESSMENTS - Wound and Skin Assessment / Reassessment X - Simple Wound Assessment / Reassessment - one wound 1 5 []  - 0 Complex Wound Assessment / Reassessment - multiple wounds []  - 0 Dermatologic / Skin Assessment (not related to wound area) ASSESSMENTS - Focused Assessment []  - Circumferential Edema Measurements - multi extremities 0 []  - 0 Nutritional Assessment / Counseling / Intervention []  - 0 Lower Extremity Assessment (monofilament, tuning fork, pulses) []  - 0 Peripheral Arterial Disease Assessment (using hand held doppler) ASSESSMENTS - Ostomy and/or Continence Assessment and Care []  - Incontinence Assessment and Management 0 []  - 0 Ostomy Care Assessment and Management (repouching, etc.) PROCESS - Coordination of Care X - Simple Patient / Family Education for ongoing care 1 15 []  - 0 Complex (extensive) Patient / Family Education for ongoing care []  - 0 Staff obtains Programmer, systems, Records, Test Results / Process Orders []  - 0 Staff telephones HHA, Nursing Homes / Clarify orders / etc []  - 0 Routine Transfer to another Facility (non-emergent condition) []  - 0 Routine Hospital Admission (non-emergent condition) []  - 0 New Admissions / Biomedical engineer / Ordering NPWT, Apligraf, etc. []  - 0 Emergency Hospital Admission (emergent condition) X- 1 10 Simple Discharge Coordination []  - 0 Complex (extensive) Discharge Coordination PROCESS - Special Needs []  - Pediatric / Minor Patient Management 0 []  - 0 Isolation Patient Management []  - 0 Hearing / Language / Visual special needs []  - 0 Assessment of Community assistance (transportation, D/C planning, etc.) []  - 0 Additional  assistance / Altered mentation []  - 0 Support Surface(s) Assessment (bed, cushion, seat, etc.) INTERVENTIONS - Wound Cleansing / Measurement Boxley, Renner ([id number]) X- 1 5 Simple Wound Cleansing -  one wound $RemoveB'[]'vBzXErOg$  - 0 Complex Wound Cleansing - multiple wounds X- 1 5 Wound Imaging (photographs - any number of wounds) $RemoveBe'[]'qeybTdCZR$  - 0 Wound Tracing (instead of photographs) X- 1 5 Simple Wound Measurement - one wound $RemoveB'[]'wccJNSnq$  - 0 Complex Wound Measurement - multiple wounds INTERVENTIONS - Wound Dressings X - Small Wound Dressing one or multiple wounds 1 10 $Re'[]'LaJ$  - 0 Medium Wound Dressing one or multiple wounds $RemoveBeforeD'[]'HtYdDGYVIrlROT$  - 0 Large Wound Dressing one or multiple wounds $RemoveBeforeD'[]'TezPxeTfdPeyvy$  - 0 Application of Medications - topical $RemoveB'[]'dZCoHXKw$  - 0 Application of Medications - injection INTERVENTIONS - Miscellaneous $RemoveBeforeD'[]'EpvKizcfwwIlhw$  - External ear exam 0 $Remo'[]'RSwaU$  - 0 Specimen Collection (cultures, biopsies, blood, body fluids, etc.) $RemoveBefor'[]'dGPLUpAUJueU$  - 0 Specimen(s) / Culture(s) sent or taken to Lab for analysis $RemoveBefo'[]'tOtHMKVACzX$  - 0 Patient Transfer (multiple staff / Civil Service fast streamer / Similar devices) $RemoveBeforeDE'[]'cXExgSvFsKkPcSH$  - 0 Simple Staple / Suture removal (25 or less) $Remove'[]'imKYQfj$  - 0 Complex Staple / Suture removal (26 or more) $Remove'[]'qWEeXIn$  - 0 Hypo / Hyperglycemic Management (close monitor of Blood Glucose) $RemoveBefore'[]'pkKvFtbsBoThJ$  - 0 Ankle / Brachial Index (ABI) - do not check if billed separately X- 1 5 Vital Signs Has the patient been seen at the hospital within the last three years: Yes Total Score: 75 Level Of Care: New/Established - Level 2 Electronic Signature(s) Unsigned Entered ByCarlene Coria on 02/23/2022 12:25:51 Signature(s): Date(s): HAROL, SHABAZZ ([id number]) -------------------------------------------------------------------------------- Encounter Discharge Information Details Patient Name: [name], [name] Date of Service: 02/23/2022 11:45 AM Medical Record Number: [id number] Patient Account Number: [id number] Date of Birth/Sex: 1956-02-03 (66 y.o. M) Treating RN: Carlene Coria Primary Care Perpetua Elling: Leward Quan  Other Clinician: Referring Burdette Gergely: Leward Quan Treating Nara Paternoster/Extender: Skipper Cliche in Treatment: 9 Encounter Discharge Information Items Discharge Condition: Stable Ambulatory Status: Wheelchair Discharge Destination: Home Transportation: Private Auto Accompanied By: caregiver Schedule Follow-up Appointment: Yes Clinical Summary of Care: Electronic Signature(s) Signed: 02/23/2022 12:26:42 PM By: Carlene Coria RN Entered By: Carlene Coria on 02/23/2022 12:26:42 [facility], [name] ([id number]) -------------------------------------------------------------------------------- Lower Extremity Assessment Details Patient Name: [name] Date of Service: 02/23/2022 11:45 AM Medical Record Number: [id number] Patient Account Number: [id number] Date of Birth/Sex: 1956/02/04 (65 y.o. M) Treating RN: Carlene Coria Primary Care Diandre Merica: Leward Quan Other Clinician: Referring Zhaniya Swallows: Leward Quan Treating Charisse Wendell/Extender: Skipper Cliche in Treatment: 9 Electronic Signature(s) Signed: 02/23/2022 12:04:57 PM By: Carlene Coria RN Entered By: Carlene Coria on 02/23/2022 12:04:57 [facility], [place] ([id number]) -------------------------------------------------------------------------------- Multi Wound Chart Details Patient Name: [name] Date of Service: 02/23/2022 11:45 AM Medical Record Number: [id number] Patient Account Number: [id number] Date of Birth/Sex: [birth year] (65 y.o. M) Treating RN: Carlene Coria Primary Care Monna Crean: Leward Quan Other Clinician: Referring Sriyan Cutting: Leward Quan Treating Clary Meeker/Extender: Skipper Cliche in Treatment: 9 Vital Signs Height(in): 76 Pulse(bpm): 33 Weight(lbs): 167 Blood Pressure(mmHg): 98/70 Body Mass Index(BMI): 20.3 Temperature(F): 98.7 Respiratory Rate(breaths/min): 18 Photos: [N/A:N/A] Wound Location: Midline Sacrum N/A N/A Wounding Event: Pressure Injury N/A N/A Primary Etiology: Pressure Ulcer N/A N/A Comorbid History: Arrhythmia,  Hypotension, History of N/A N/A pressure wounds, Quadriplegia Date Acquired: 10/18/2021 N/A N/A Weeks of Treatment: 9 N/A N/A Wound Status: Open N/A N/A Wound Recurrence: No N/A N/A Measurements L x W x D (cm) 5x0.5x0.3 N/A N/A Area (cm) : 1.963 N/A N/A Volume (cm) : 0.589 N/A N/A % Reduction in Area: 15.00% N/A N/A % Reduction in Volume: -27.50% N/A N/A Classification: Category/Stage III N/A N/A Exudate Amount: Medium N/A N/A Exudate Type: Serous N/A N/A Exudate Color: amber N/A N/A Wound Margin: Flat and Intact N/A  N/A Granulation Amount: Large (67-100%) N/A N/A Granulation Quality: Red N/A N/A Necrotic Amount: Small (1-33%) N/A N/A Exposed Structures: Fat Layer (Subcutaneous Tissue): N/A N/A Yes Fascia: No Tendon: No Muscle: No Joint: No Bone: No Epithelialization: None N/A N/A Treatment Notes Electronic Signature(s) Signed: 02/23/2022 12:05:13 PM By: Carlene Coria RN Entered By: Carlene Coria on 02/23/2022 12:05:12 [facility], [name] ([id number]) -------------------------------------------------------------------------------- [place] Details Patient Name: [name] Date of Service: 02/23/2022 11:45 AM Medical Record Number: [id number] Patient Account Number: [id number] Date of Birth/Sex: 17-Jun-1956 (65 y.o. M) Treating RN: Carlene Coria Primary Care Sherrye Puga: Leward Quan Other Clinician: Referring Marcellino Fidalgo: Leward Quan Treating Ukiah Trawick/Extender: Skipper Cliche in Treatment: 9 Active Inactive Pressure Nursing Diagnoses: Knowledge deficit related to management of pressures ulcers Goals: Patient will remain free from development of additional pressure ulcers Date Initiated: 12/18/2021 Target Resolution Date: 12/18/2021 Goal Status: Active Patient will remain free of pressure ulcers Date Initiated: 12/18/2021 Target Resolution Date: 12/18/2021 Goal Status: Active Patient/caregiver will verbalize risk factors for pressure ulcer development Date  Initiated: 12/18/2021 Target Resolution Date: 12/18/2021 Goal Status: Active Patient/caregiver will verbalize understanding of pressure ulcer management Date Initiated: 12/18/2021 Target Resolution Date: 12/18/2021 Goal Status: Active Interventions: Assess: immobility, friction, shearing, incontinence upon admission and as needed Assess offloading mechanisms upon admission and as needed Assess potential for pressure ulcer upon admission and as needed Provide education on pressure ulcers Treatment Activities: Patient referred for seating evaluation to ensure proper offloading : 12/18/2021 Notes: Wound/Skin Impairment Nursing Diagnoses: Impaired tissue integrity Knowledge deficit related to smoking impact on wound healing Knowledge deficit related to ulceration/compromised skin integrity Goals: Patient/caregiver will verbalize understanding of skin care regimen Date Initiated: 12/18/2021 Date Inactivated: 01/25/2022 Target Resolution Date: 12/18/2021 Goal Status: Met Ulcer/skin breakdown will have a volume reduction of 30% by week 4 Date Initiated: 12/18/2021 Target Resolution Date: 01/15/2022 Goal Status: Active Interventions: Assess ulceration(s) every visit Treatment Activities: Referred to DME [facility] for dressing supplies : 12/18/2021 Skin care regimen initiated : 12/18/2021 Topical wound management initiated : 12/18/2021 KAIDON, KINKER ([id number]) Notes: Electronic Signature(s) Signed: 02/23/2022 12:05:03 PM By: Carlene Coria RN Entered By: Carlene Coria on 02/23/2022 12:05:03 [facility], [place] ([id number]) -------------------------------------------------------------------------------- Pain Assessment Details Patient Name: [name] Date of Service: 02/23/2022 11:45 AM Medical Record Number: [id number] Patient Account Number: [id number] Date of Birth/Sex: 1955/12/05 (66 y.o. M) Treating RN: Carlene Coria Primary Care Raahi Korber: Leward Quan Other Clinician: Referring Kirsti Mcalpine: Leward Quan Treating Verdell Kincannon/Extender: Skipper Cliche in Treatment: 9 Active Problems Location of Pain Severity and Description of Pain Patient Has Paino No Site Locations Pain Management and Medication Current Pain Management: Electronic Signature(s) Signed: 02/23/2022 12:04:04 PM By: Carlene Coria RN Entered By: Carlene Coria on 02/23/2022 12:04:04 [facility], [name] ([id number]) -------------------------------------------------------------------------------- Patient/Caregiver Education Details Patient Name: [name] Date of Service: 02/23/2022 11:45 AM Medical Record Number: [id number] Patient Account Number: [id number] Date of Birth/Gender: Jul 22, 1955 (65 y.o. M) Treating RN: Carlene Coria Primary Care Physician: Leward Quan Other Clinician: Referring Physician: Leward Quan Treating Physician/Extender: Skipper Cliche in Treatment: 9 Education Assessment Education Provided To: Patient Education Topics Provided Pressure: Methods: Explain/Verbal Responses: State content correctly Electronic Signature(s) Unsigned Entered ByCarlene Coria on 02/23/2022 12:26:07 Signature(s): Date(s): [name] ([id number]) -------------------------------------------------------------------------------- Wound Assessment Details Patient Name: [name], [name] Date of Service: 02/23/2022 11:45 AM Medical Record Number: [id number] Patient Account Number: [id number] Date of Birth/Sex: November 22, 1955 (66 y.o. M) Treating RN: Carlene Coria Primary Care Luretha Eberly: Leward Quan Other Clinician: Referring Janesia Joswick: Leward Quan Treating Roberth Berling/Extender: Skipper Cliche  in Treatment: 9 Wound Status Wound Number: 1 Primary Pressure Ulcer Etiology: Wound Location: Midline Sacrum Wound Status: Open Wounding Event: Pressure Injury Comorbid Arrhythmia, Hypotension, History of pressure wounds, Date Acquired: 10/18/2021 History: Quadriplegia Weeks Of Treatment: 9 Clustered Wound: No Photos Wound  Measurements Length: (cm) 5 Width: (cm) 0.5 Depth: (cm) 0.3 Area: (cm) 1.963 Volume: (cm) 0.589 % Reduction in Area: 15% % Reduction in Volume: -27.5% Epithelialization: None Tunneling: No Undermining: No Wound Description Classification: Category/Stage III Wound Margin: Flat and Intact Exudate Amount: Medium Exudate Type: Serous Exudate Color: amber Foul Odor After Cleansing: No Slough/Fibrino Yes Wound Bed Granulation Amount: Large (67-100%) Exposed Structure Granulation Quality: Red Fascia Exposed: No Necrotic Amount: Small (1-33%) Fat Layer (Subcutaneous Tissue) Exposed: Yes Necrotic Quality: Adherent Slough Tendon Exposed: No Muscle Exposed: No Joint Exposed: No Bone Exposed: No Treatment Notes Wound #1 (Sacrum) Wound Laterality: Midline Cleanser Soap and Water Discharge Instruction: Gently cleanse wound with antibacterial soap, rinse and pat dry prior to dressing wounds [facility], Jordanny ([id number]) Topical Primary Dressing Gauze Discharge Instruction: moistened with 1/4 Dakins Solution Secondary Dressing (BORDER) Zetuvit Plus SILICONE BORDER Dressing 4x4 (in/in) Discharge Instruction: Please do not put silicone bordered dressings under wraps. Use non-bordered dressing only. When applying please make sure skin is pulled flat so dressing stays in place. Secured With Compression Wrap Compression Stockings Environmental education officer) Signed: 02/23/2022 12:04:38 PM By: Carlene Coria RN Entered By: Carlene Coria on 02/23/2022 12:04:38 [facility], [name] ([id number]) -------------------------------------------------------------------------------- Vitals Details Patient Name: [name] Date of Service: 02/23/2022 11:45 AM Medical Record Number: [id number] Patient Account Number: [id number] Date of Birth/Sex: Mar 23, 1956 (65 y.o. M) Treating RN: Carlene Coria Primary Care Angeles Paolucci: Leward Quan Other Clinician: Referring Ladesha Pacini: Leward Quan Treating  Mancel Lardizabal/Extender: Skipper Cliche in Treatment: 9 Vital Signs Time Taken: 12:00 Temperature (F): 98.7 Height (in): 76 Pulse (bpm): 68 Weight (lbs): 167 Respiratory Rate (breaths/min): 18 Body Mass Index (BMI): 20.3 Blood Pressure (mmHg): 98/70 Reference Range: 80 - 120 mg / dl Electronic Signature(s) Signed: 02/23/2022 12:03:57 PM By: Carlene Coria RN Entered By: Carlene Coria on 02/23/2022 12:03:57

## 2022-02-26 MED ORDER — LACTATED RINGERS IV SOLN: INTRAVENOUS | Status: DC

## 2022-02-26 MED ORDER — CHLORHEXIDINE GLUCONATE 0.12 % MT SOLN: 15.0000 mL | Freq: Once | OROMUCOSAL | Status: AC

## 2022-02-26 MED ORDER — ORAL CARE MOUTH RINSE: 15.0000 mL | Freq: Once | OROMUCOSAL | Status: AC

## 2022-02-26 MED ORDER — SODIUM CHLORIDE 0.9 % IV SOLN
2.0000 g | Freq: Once | INTRAVENOUS | Status: AC
  Administered 2022-02-27: 2 g via INTRAVENOUS
  Filled 2022-02-26: qty 2

## 2022-02-26 MED ORDER — FAMOTIDINE 20 MG PO TABS: 20.0000 mg | ORAL_TABLET | Freq: Once | ORAL | Status: AC

## 2022-02-26 MED ORDER — AMPICILLIN SODIUM 2 G IV SOLR: 2.0000 g | Freq: Once | INTRAVENOUS | Status: DC

## 2022-02-26 MED ORDER — GENTAMICIN SULFATE 40 MG/ML IJ SOLN
5.0000 mg/kg | Freq: Once | INTRAVENOUS | Status: AC
  Administered 2022-02-27: 370 mg via INTRAVENOUS
  Filled 2022-02-26: qty 9.25

## 2022-02-27 ENCOUNTER — Encounter: Payer: Self-pay | Admitting: Urology

## 2022-02-27 ENCOUNTER — Ambulatory Visit: Payer: Medicare (Managed Care) | Admitting: Urgent Care

## 2022-02-27 ENCOUNTER — Other Ambulatory Visit: Payer: Self-pay

## 2022-02-27 ENCOUNTER — Ambulatory Visit: Payer: Medicare (Managed Care)

## 2022-02-27 ENCOUNTER — Ambulatory Visit
Admission: RE | Admit: 2022-02-27 | Discharge: 2022-02-27 | Disposition: A | Discharge status: Home / Self Care | Payer: Medicare (Managed Care) | Source: Ambulatory Visit | Attending: Urology | Admitting: Urology

## 2022-02-27 ENCOUNTER — Encounter
Admission: RE | Disposition: A | Discharge status: Home / Self Care | Payer: Self-pay | Source: Home / Self Care | Attending: Urology

## 2022-02-27 ENCOUNTER — Ambulatory Visit
Admission: RE | Admit: 2022-02-27 | Discharge: 2022-02-27 | Disposition: A | Discharge status: Home / Self Care | Payer: Medicare (Managed Care) | Attending: Urology | Admitting: Urology

## 2022-02-27 DIAGNOSIS — I4891 Unspecified atrial fibrillation: Secondary | ICD-10-CM | POA: Insufficient documentation

## 2022-02-27 DIAGNOSIS — D649 Anemia, unspecified: Secondary | ICD-10-CM | POA: Insufficient documentation

## 2022-02-27 DIAGNOSIS — N4289 Other specified disorders of prostate: Secondary | ICD-10-CM

## 2022-02-27 DIAGNOSIS — R972 Elevated prostate specific antigen [PSA]: Secondary | ICD-10-CM

## 2022-02-27 DIAGNOSIS — Z933 Colostomy status: Secondary | ICD-10-CM | POA: Diagnosis not present

## 2022-02-27 DIAGNOSIS — C61 Malignant neoplasm of prostate: Secondary | ICD-10-CM | POA: Diagnosis not present

## 2022-02-27 DIAGNOSIS — N210 Calculus in bladder: Secondary | ICD-10-CM

## 2022-02-27 DIAGNOSIS — D759 Disease of blood and blood-forming organs, unspecified: Secondary | ICD-10-CM | POA: Diagnosis not present

## 2022-02-27 DIAGNOSIS — R3989 Other symptoms and signs involving the genitourinary system: Secondary | ICD-10-CM

## 2022-02-27 HISTORY — PX: PROSTATE BIOPSY: SHX241

## 2022-02-27 HISTORY — PX: CYSTOSCOPY WITH LITHOLAPAXY: SHX1425

## 2022-02-27 SURGERY — BIOPSY, PROSTATE
Anesthesia: General

## 2022-02-27 MED ORDER — LIDOCAINE HCL (CARDIAC) PF 100 MG/5ML IV SOSY
PREFILLED_SYRINGE | INTRAVENOUS | Status: DC | PRN
  Administered 2022-02-27: 100 mg via INTRAVENOUS

## 2022-02-27 MED ORDER — STERILE WATER FOR IRRIGATION IR SOLN
Status: DC | PRN
  Administered 2022-02-27: 500 mL

## 2022-02-27 MED ORDER — OXYCODONE HCL 5 MG/5ML PO SOLN: 5.0000 mg | Freq: Once | ORAL | Status: DC | PRN

## 2022-02-27 MED ORDER — LIDOCAINE HCL (PF) 2 % IJ SOLN
INTRAMUSCULAR | Status: AC
  Filled 2022-02-27: qty 5

## 2022-02-27 MED ORDER — PROPOFOL 10 MG/ML IV BOLUS
INTRAVENOUS | Status: AC
  Filled 2022-02-27: qty 20

## 2022-02-27 MED ORDER — FENTANYL CITRATE (PF) 100 MCG/2ML IJ SOLN
INTRAMUSCULAR | Status: DC | PRN
  Administered 2022-02-27 (×2): 50 ug via INTRAVENOUS

## 2022-02-27 MED ORDER — CHLORHEXIDINE GLUCONATE 0.12 % MT SOLN
OROMUCOSAL | Status: AC
  Administered 2022-02-27: 15 mL via OROMUCOSAL
  Filled 2022-02-27: qty 15

## 2022-02-27 MED ORDER — ROCURONIUM BROMIDE 100 MG/10ML IV SOLN
INTRAVENOUS | Status: DC | PRN
  Administered 2022-02-27: 50 mg via INTRAVENOUS

## 2022-02-27 MED ORDER — DEXAMETHASONE SODIUM PHOSPHATE 10 MG/ML IJ SOLN
INTRAMUSCULAR | Status: DC | PRN
  Administered 2022-02-27: 10 mg via INTRAVENOUS

## 2022-02-27 MED ORDER — PHENYLEPHRINE HCL-NACL 20-0.9 MG/250ML-% IV SOLN
INTRAVENOUS | Status: AC
  Filled 2022-02-27: qty 250

## 2022-02-27 MED ORDER — PROPOFOL 10 MG/ML IV BOLUS
INTRAVENOUS | Status: DC | PRN
  Administered 2022-02-27: 80 mg via INTRAVENOUS

## 2022-02-27 MED ORDER — ROCURONIUM BROMIDE 10 MG/ML (PF) SYRINGE
PREFILLED_SYRINGE | INTRAVENOUS | Status: AC
  Filled 2022-02-27: qty 10

## 2022-02-27 MED ORDER — OXYCODONE HCL 5 MG PO TABS: 5.0000 mg | ORAL_TABLET | Freq: Once | ORAL | Status: DC | PRN

## 2022-02-27 MED ORDER — CEFAZOLIN SODIUM-DEXTROSE 2-4 GM/100ML-% IV SOLN
INTRAVENOUS | Status: AC
  Filled 2022-02-27: qty 100

## 2022-02-27 MED ORDER — ACETAMINOPHEN 10 MG/ML IV SOLN
INTRAVENOUS | Status: DC | PRN
  Administered 2022-02-27: 1000 mg via INTRAVENOUS

## 2022-02-27 MED ORDER — MIDAZOLAM HCL 2 MG/2ML IJ SOLN
INTRAMUSCULAR | Status: DC | PRN
  Administered 2022-02-27: 2 mg via INTRAVENOUS

## 2022-02-27 MED ORDER — SUGAMMADEX SODIUM 200 MG/2ML IV SOLN
INTRAVENOUS | Status: DC | PRN
  Administered 2022-02-27: 200 mg via INTRAVENOUS

## 2022-02-27 MED ORDER — ACETAMINOPHEN 10 MG/ML IV SOLN
INTRAVENOUS | Status: AC
  Filled 2022-02-27: qty 100

## 2022-02-27 MED ORDER — ONDANSETRON HCL 4 MG/2ML IJ SOLN
INTRAMUSCULAR | Status: DC | PRN
  Administered 2022-02-27: 4 mg via INTRAVENOUS

## 2022-02-27 MED ORDER — PHENYLEPHRINE 80 MCG/ML (10ML) SYRINGE FOR IV PUSH (FOR BLOOD PRESSURE SUPPORT)
PREFILLED_SYRINGE | INTRAVENOUS | Status: DC | PRN
  Administered 2022-02-27 (×6): 160 ug via INTRAVENOUS

## 2022-02-27 MED ORDER — PHENYLEPHRINE 80 MCG/ML (10ML) SYRINGE FOR IV PUSH (FOR BLOOD PRESSURE SUPPORT)
PREFILLED_SYRINGE | INTRAVENOUS | Status: AC
  Filled 2022-02-27: qty 10

## 2022-02-27 MED ORDER — STERILE WATER FOR IRRIGATION IR SOLN
Status: DC | PRN
  Administered 2022-02-27 (×2): 3000 mL via INTRAVESICAL

## 2022-02-27 MED ORDER — FENTANYL CITRATE (PF) 100 MCG/2ML IJ SOLN: 25.0000 ug | INTRAMUSCULAR | Status: DC | PRN

## 2022-02-27 MED ORDER — FAMOTIDINE 20 MG PO TABS
ORAL_TABLET | ORAL | Status: AC
  Administered 2022-02-27: 20 mg via ORAL
  Filled 2022-02-27: qty 1

## 2022-02-27 MED ORDER — MIDAZOLAM HCL 2 MG/2ML IJ SOLN
INTRAMUSCULAR | Status: AC
  Filled 2022-02-27: qty 2

## 2022-02-27 MED ORDER — FENTANYL CITRATE (PF) 100 MCG/2ML IJ SOLN
INTRAMUSCULAR | Status: AC
  Filled 2022-02-27: qty 2

## 2022-02-27 SURGICAL SUPPLY — 34 items
BAG DRAIN SIEMENS DORNER NS (MISCELLANEOUS) ×3 IMPLANT
BAG DRN NS LF (MISCELLANEOUS) ×2
BAG DRN RND TRDRP ANRFLXCHMBR (UROLOGICAL SUPPLIES)
BAG URINE DRAIN 2000ML AR STRL (UROLOGICAL SUPPLIES) IMPLANT
BASKET ZERO TIP 1.9FR (BASKET) IMPLANT
BRUSH SCRUB EZ 1% IODOPHOR (MISCELLANEOUS) ×3 IMPLANT
BSKT STON RTRVL ZERO TP 1.9FR (BASKET)
CATH FOL 2WAY LX 16X30 (CATHETERS) ×1 IMPLANT
CATH FOL 2WAY LX 18X30 (CATHETERS) IMPLANT
CATH URETL OPEN END 6X70 (CATHETERS) ×3 IMPLANT
COVER MAYO STAND REUSABLE (DRAPES) ×3 IMPLANT
DRAPE UTILITY 15X26 TOWEL STRL (DRAPES) ×3 IMPLANT
DRSG TELFA 3X4 N-ADH STERILE (GAUZE/BANDAGES/DRESSINGS) ×3 IMPLANT
ELECT REM PT RETURN 9FT ADLT (ELECTROSURGICAL)
ELECTRODE REM PT RTRN 9FT ADLT (ELECTROSURGICAL) ×2 IMPLANT
FIBER LASER MOSES 200 DFL (Laser) IMPLANT
GLOVE SURG UNDER POLY LF SZ7.5 (GLOVE) ×3 IMPLANT
GOWN STRL REUS W/ TWL LRG LVL3 (GOWN DISPOSABLE) ×6 IMPLANT
GOWN STRL REUS W/ TWL XL LVL3 (GOWN DISPOSABLE) ×3 IMPLANT
GOWN STRL REUS W/TWL LRG LVL3 (GOWN DISPOSABLE) ×4
GOWN STRL REUS W/TWL XL LVL3 (GOWN DISPOSABLE) ×2
GUIDEWIRE STR DUAL SENSOR (WIRE) ×3 IMPLANT
INST BIOPSY MAXCORE 18GX25 (NEEDLE) ×3 IMPLANT
KIT PROBE TRILOGY 3.9X350 (MISCELLANEOUS) IMPLANT
KIT TURNOVER CYSTO (KITS) ×3 IMPLANT
NDL SAFETY ECLIP 18X1.5 (MISCELLANEOUS) ×3 IMPLANT
PACK CYSTO AR (MISCELLANEOUS) ×3 IMPLANT
SET CYSTO W/LG BORE CLAMP LF (SET/KITS/TRAYS/PACK) ×3 IMPLANT
SET IRRIG Y TYPE TUR BLADDER L (SET/KITS/TRAYS/PACK) ×3 IMPLANT
SURGILUBE 2OZ TUBE FLIPTOP (MISCELLANEOUS) ×3 IMPLANT
SYR TOOMEY IRRIG 70ML (MISCELLANEOUS) ×2
SYRINGE TOOMEY IRRIG 70ML (MISCELLANEOUS) ×3 IMPLANT
WATER STERILE IRR 3000ML UROMA (IV SOLUTION) ×3 IMPLANT
WATER STERILE IRR 500ML POUR (IV SOLUTION) ×3 IMPLANT

## 2022-02-27 NOTE — Anesthesia Postprocedure Evaluation (Signed)
Anesthesia Post Note  Patient: [name]  Procedure(s) Performed: PROSTATE BIOPSY CYSTOSCOPY  Patient location during evaluation: PACU Anesthesia Type: General Level of consciousness: awake and alert Pain management: pain level controlled Vital Signs Assessment: post-procedure vital signs reviewed and stable Respiratory status: spontaneous breathing, nonlabored ventilation, respiratory function stable and patient connected to nasal cannula oxygen Cardiovascular status: blood pressure returned to baseline and stable Postop Assessment: no apparent nausea or vomiting Anesthetic complications: no   No notable events documented.   Last Vitals:  Vitals:   02/27/22 1300 02/27/22 1315  BP: 106/75 (!) 85/66  Pulse: 78 89  Resp: 13 13  Temp:    SpO2: 100% 99%    Last Pain:  Vitals:   02/27/22 1315  TempSrc:   PainSc: 0-No pain                 Ilene Qua

## 2022-02-27 NOTE — Anesthesia Preprocedure Evaluation (Signed)
Anesthesia Evaluation  Patient identified by MRN, date of birth, ID band Patient awake    Reviewed: Allergy & Precautions, NPO status   History of Anesthesia Complications Negative for: history of anesthetic complications  Airway Mallampati: I  TM Distance: >3 FB     Dental no notable dental hx.    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, quadriplegia, wheelchair bound   negative psych ROS   GI/Hepatic Neg liver ROS, Colostomy    Endo/Other    Renal/GU  Bladder dysfunction      Musculoskeletal   Abdominal   Peds  Hematology  (+) Blood dyscrasia, anemia ,   Anesthesia Other Findings   Reproductive/Obstetrics                             Anesthesia Physical Anesthesia Plan  ASA: 3  Anesthesia Plan: General   Post-op Pain Management: Tylenol PO (pre-op)*, Minimal or no pain anticipated and Toradol IV (intra-op)*   Induction: Intravenous  PONV Risk Score and Plan: 2 and Ondansetron, Dexamethasone and Treatment may vary due to age or medical condition  Airway Management Planned: Oral ETT  Additional Equipment:   Intra-op Plan:   Post-operative Plan: Extubation in OR  Informed Consent: I have reviewed the patients History and Physical, chart, labs and discussed the procedure including the risks, benefits and alternatives for the proposed anesthesia with the patient or authorized representative who has indicated his/her understanding and acceptance.     Dental Advisory Given  Plan Discussed with: Anesthesiologist, CRNA and Surgeon  Anesthesia Plan Comments: (Patient consented for risks of anesthesia including but not limited to:  - adverse reactions to  medications - damage to eyes, teeth, lips or other oral mucosa - nerve damage due to positioning  - sore throat or hoarseness - Damage to heart, brain, nerves, lungs, other parts of body or loss of life  Patient voiced understanding.)        Anesthesia Quick Evaluation

## 2022-02-27 NOTE — H&P (Signed)
Urology H&P   History of Present Illness: [name] is a 66 y.o. with elevated PSA and PI-RADS 5 lesions on prostate MRI.  Possible bladder mass on CT/PET.  He presents for cystoscopy with possible bladder biopsy and TRUS/prostate biopsy  Past Medical History:  Diagnosis Date   Anemia    Aortic atherosclerosis ([place])    B12 deficiency    Bladder stones    Colostomy present (HCC)    Constipated    Elevated PSA    HLD (hyperlipidemia)    Hypotension    a.) on midodrine   Neurogenic bladder    Olecranon bursitis    PAF (paroxysmal atrial fibrillation) ([place])    a.) CHA2DS2VASc = 2 (age, vascular disease history);  b.) rate/rhythm maintained on oral cardizem; no chronic anticoagulation   Quadriplegia, C5-C7, incomplete (HCC)    Renal cyst, left    Sacral decubitus ulcer    SIRS (systemic inflammatory response syndrome) ([place]) 06/08/2013   Spinal cord injury, C5-C7 ([place])    Vitamin D deficiency    Wheelchair dependent     Past Surgical History:  Procedure Laterality Date   NECK SURGERY  1976    Home Medications:  Current Meds  Medication Sig   amoxicillin-clavulanate (AUGMENTIN) 875-125 MG tablet Take 1 tablet by mouth every 12 (twelve) hours.   Cholecalciferol 25 MCG (1000 UT) tablet Take by mouth.   Cyanocobalamin (VITAMIN B 12 PO) Take by mouth.   diltiazem (CARDIZEM) 60 MG tablet Take by mouth.   docusate sodium (COLACE) 100 MG capsule Take 100 mg by mouth daily as needed.   gabapentin (NEURONTIN) 300 MG capsule Take 300 mg by mouth at bedtime.   midodrine (PROAMATINE) 2.5 MG tablet Take 2.5 mg by mouth daily.    Allergies: No Known Allergies  History reviewed. No pertinent family history.  Social History:  reports that he has never smoked. He has never used smokeless tobacco. He reports that he does not currently use alcohol. He reports that he does not use drugs.  ROS: A complete review of systems was performed.  All systems are negative except for  pertinent findings as noted.  Physical Exam:  Vital signs in last 24 hours: Temp:  [97.6 F (36.4 C)] 97.6 F (36.4 C) (09/12 1051) Pulse Rate:  [90] 90 (09/12 1051) Resp:  [14] 14 (09/12 1051) BP: (70)/(50) 70/50 (09/12 1051) SpO2:  [99 %] 99 % (09/12 1051) Weight:  [74.4 kg] 74.4 kg (09/12 1051) Constitutional:  Alert, no acute distress HEENT: [place] AT, moist mucus membranes.  Trachea midline, no masses Cardiovascular: Regular rate and rhythm, no clubbing, cyanosis, or edema. Respiratory: Normal respiratory effort, lungs clear bilaterally Psychiatric: Normal mood and affect   Laboratory Data:  No results for input(s): "WBC", "HGB", "HCT" in the last 72 hours. No results for input(s): "NA", "K", "CL", "CO2", "GLUCOSE", "BUN", "CREATININE", "CALCIUM" in the last 72 hours. No results for input(s): "LABPT", "INR" in the last 72 hours. No results for input(s): "LABURIN" in the last 72 hours. Results for orders placed or performed in visit on 02/08/22  CULTURE, URINE COMPREHENSIVE     Status: Abnormal   Collection Time: 02/08/22  2:21 PM   Specimen: Urine   UR  Result Value Ref Range Status   Urine Culture, Comprehensive Final report (A)  Final   Organism ID, Bacteria Enterococcus faecalis (A)  Final    Comment: For Enterococcus species, aminoglycosides (except for high-level resistance screening), cephalosporins, clindamycin, and trimethoprim-sulfamethoxazole are not effective  clinically. (CLSI, M100-S26, 2016) Greater than 100,000 colony forming units per mL    ANTIMICROBIAL SUSCEPTIBILITY Comment  Final    Comment:       ** S = Susceptible; I = Intermediate; R = Resistant **                    P = Positive; N = Negative             MICS are expressed in micrograms per mL    Antibiotic                 RSLT#1    RSLT#2    RSLT#3    RSLT#4 Ciprofloxacin                  R Levofloxacin                   R Nitrofurantoin                 S Penicillin                      S Tetracycline                   R Vancomycin                     S   Microscopic Examination     Status: Abnormal   Collection Time: 02/08/22  2:21 PM   Urine  Result Value Ref Range Status   WBC, UA 6-10 (A) 0 - 5 /hpf Final   RBC, Urine 0-2 0 - 2 /hpf Final   Epithelial Cells (non renal) 0-10 0 - 10 /hpf Final   Bacteria, UA Many (A) None seen/Few Final     Impression/Plan: 66 y.o. male with PSA 20 and abnormal prostate MRI.  PSMA with possible bladder mass. He presents for transrectal ultrasound with prostate biopsy and cystoscopy with possible biopsy    02/27/2022, 11:29 AM  John Giovanni,  MD

## 2022-02-27 NOTE — Discharge Instructions (Addendum)
CATHETER CAN BE REMOVED 03/01/22, THE OFFICE WILL CONTACT YOU TO COME IN FOR THIS OR [facility] CAN DO IT.   MAKE SURE URINATE WITHIN ABOUT 6 HOURS AFTER FOLEY IS REMOVED.    TAKE MEDICATIONS AS NORMALLY WOULD CALL OFFICE FOR ANY CONCERNS      AMBULATORY SURGERY  DISCHARGE INSTRUCTIONS   The drugs that you were given will stay in your system until tomorrow so for the next 24 hours you should not:  Drive an automobile Make any legal decisions Drink any alcoholic beverage   You may resume regular meals tomorrow.  Today it is better to start with liquids and gradually work up to solid foods.  You may eat anything you prefer, but it is better to start with liquids, then soup and crackers, and gradually work up to solid foods.   Please notify your doctor immediately if you have any unusual bleeding, trouble breathing, redness and pain at the surgery site, drainage, fever, or pain not relieved by medication.     Your post-operative visit with Dr.                                       is: Date:                        Time:    Please call to schedule your post-operative visit.  Additional Instructions:

## 2022-02-27 NOTE — Transfer of Care (Signed)
Immediate Anesthesia Transfer of Care Note  Patient: [name]  Procedure(s) Performed: PROSTATE BIOPSY CYSTOSCOPY  Patient Location: PACU  Anesthesia Type:General  Level of Consciousness: awake, alert  and oriented  Airway & Oxygen Therapy: Patient Spontanous Breathing and Patient connected to face mask oxygen  Post-op Assessment: Report given to RN and Post -op Vital signs reviewed and stable  Post vital signs: Reviewed and stable  Last Vitals:  Vitals Value Taken Time  BP 109/93 02/27/22 1245  Temp    Pulse    Resp 12 02/27/22 1247  SpO2    Vitals shown include unvalidated device data.  Last Pain:  Vitals:   02/27/22 1051  TempSrc: Temporal  PainSc: 0-No pain      Patients Stated Pain Goal: 0 ([id number])  Complications: No notable events documented.

## 2022-02-27 NOTE — Anesthesia Procedure Notes (Signed)
Procedure Name: Intubation Date/Time: 02/27/2022 11:57 AM  Performed by: Cammie Sickle, CRNAPre-anesthesia Checklist: Patient identified, Patient being monitored, Timeout performed, Emergency Drugs available and Suction available Patient Re-evaluated:Patient Re-evaluated prior to induction Oxygen Delivery Method: Circle system utilized Preoxygenation: Pre-oxygenation with 100% oxygen Induction Type: IV induction Ventilation: Mask ventilation without difficulty Laryngoscope Size: 3 and McGraph Grade View: Grade I Tube type: Oral Tube size: 7.0 mm Number of attempts: 1 Airway Equipment and Method: Stylet Placement Confirmation: ETT inserted through vocal cords under direct vision, positive ETCO2 and breath sounds checked- equal and bilateral Secured at: 22 cm Tube secured with: Tape Dental Injury: Teeth and Oropharynx as per pre-operative assessment

## 2022-02-27 NOTE — Interval H&P Note (Signed)
History and Physical Interval Note:  CV:RRR Lungs:clear  02/27/2022 11:28 AM  [name]  has presented today for surgery, with the diagnosis of Elevated Prostate Specific Antigen, Bladder Calculus.  The various methods of treatment have been discussed with the patient and family. After consideration of risks, benefits and other options for treatment, the patient has consented to  Procedure(s): TRANSRECTAL ULTRASOUND (N/A) PROSTATE BIOPSY (N/A) CYSTOSCOPY WITH RETROGRADE PYELOGRAM (Bilateral) CYSTOSCOPY WITH LITHOLAPAXY (N/A) CYSTOSCOPY WITH BLADDER BIOPSY (N/A) as a surgical intervention.  The patient's history has been reviewed, patient examined, no change in status, stable for surgery.  I have reviewed the patient's chart and labs.  Questions were answered to the patient's satisfaction.     [place]

## 2022-02-28 ENCOUNTER — Encounter: Payer: Self-pay | Admitting: Urology

## 2022-02-28 LAB — SURGICAL PATHOLOGY

## 2022-03-02 NOTE — Op Note (Signed)
Preoperative diagnosis:  Elevated PSA with evidence of metastatic prostate cancer Bladder calculi  Postoperative diagnosis:  Same  Procedure: Transrectal prostate biopsies Cystoscopy  Surgeon: Abbie Sons, MD  Anesthesia: General  Complications: None  Intraoperative findings:  Cystoscopy-urethra normal in caliber without stricture.  Moderate lateral lobe enlargement/bladder neck elevation.  Trabeculated bladder with fine crystalline particles noted.  No significant calculus noted.  No bladder mucosal abnormalities or mass noted  EBL: Minimal  Specimens: Needle biopsies prostate  Indication: [name] is a 66 y.o. male with an elevated PSA and prostate MRI showing PI-RADS 5 lesions.  Unable to have fusion biopsy in [place] secondary to insurance.  PSMA PET/showed no evidence of metastatic disease after reviewing the management options for treatment, he elected to proceed with the above surgical procedure(s). We have discussed the potential benefits and risks of the procedure, side effects of the proposed treatment, the likelihood of the patient achieving the goals of the procedure, and any potential problems that might occur during the procedure or recuperation. Informed consent has been obtained.  Description of procedure:  The patient was taken to the operating room and general anesthesia was induced.  He was unable to be placed in the dorsolithotomy position secondary to contractures.  He was able to be placed in the left lateral decubitus position.  He has a colostomy and a transrectal ultrasound probe was unable to be inserted per rectum due to small anus.  DRE was performed with small prostate and abnormal firmness on the left.  Digital biopsies were obtained from the base mid and apical portions of the left and right portions of the prostate.  No significant bleeding was noted.  He was then rolled to the supine position and prepped and draped.  A 16 French flexible  cystoscope was lubricated and inserted per urethra.  The scope was advanced into the bladder under direct vision with findings as described above.  The cystoscope was removed and after anesthetic reversal he was transported to the PACU in stable condition.  Plan: Follow-up appointment to be scheduled for pathology results   Abbie Sons, M.D.

## 2022-03-12 ENCOUNTER — Encounter: Payer: Medicare (Managed Care) | Admitting: Physician Assistant

## 2022-03-12 DIAGNOSIS — L89153 Pressure ulcer of sacral region, stage 3: Secondary | ICD-10-CM | POA: Diagnosis not present

## 2022-03-12 NOTE — Progress Notes (Addendum)
VICTOR, LANGENBACH ([id number]) Visit Report for 03/12/2022 Chief Complaint Document Details Patient Name: [name], [name] Date of Service: 03/12/2022 2:45 PM Medical Record Number: [id number] Patient Account Number: [id number] Date of Birth/Sex: 09/10/1955 (66 y.o. M) Treating RN: Carlene Coria Primary Care Provider: Leward Quan Other Clinician: Referring Provider: Leward Quan Treating Provider/Extender: Skipper Cliche in Treatment: 12 Information Obtained from: Patient Chief Complaint Sacral pressure ulcer Electronic Signature(s) Signed: 03/12/2022 2:54:52 PM By: Worthy Keeler PA-C Entered By: Worthy Keeler on 03/12/2022 14:54:52 Kinker, Elgie ([id number]) -------------------------------------------------------------------------------- HPI Details Patient Name: [name] Date of Service: 03/12/2022 2:45 PM Medical Record Number: [id number] Patient Account Number: [id number] Date of Birth/Sex: 01/31/1956 (66 y.o. M) Treating RN: Carlene Coria Primary Care Provider: Leward Quan Other Clinician: Referring Provider: Leward Quan Treating Provider/Extender: Skipper Cliche in Treatment: 12 History of Present Illness HPI Description: 12-18-2021 upon evaluation today patient presents for initial evaluation here in the clinic concerning an issue he has been having over the sacral area where he has a wound this is a stage III pressure ulcer. It also appears there may still be some moisture component to this based on what I am seeing. I do not see any evidence of infection but at the same time I do believe that the patient is in need of some additional and hopefully better dressings in order to help this heal more appropriately and quickly. Currently just a protective dressing has been used over this sounds like may possibly be a border foam. The patient did have a CT scan which was negative for osteomyelitis he had a previous wound above this in the sacral region but not necessarily right of this  area. 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 He also has atrial fibrillation and hypotension. 01-01-2022 upon evaluation today patient appears to be doing well with regard to his wound actually see some signs of improvement there is still quite a bit of feeling to be done here but nonetheless I think we are on the right track. I do not see any evidence of active infection locally or systemically at this time which is great news. 01-25-2022 upon evaluation today patient's wound really is not significantly better it actually appears about the same. Of note I do believe that part of the issue here is probably the fact that honestly it is hard to keep the dressing in place I think that when the alginate is put on it needs to be cut bigger than what is currently being seen on the dressing today probably by about 3 times that size and placed directly to the wound bed while spreading the butt cheeks bilaterally. This will allow for the dressing to be right intact with the wound bed so that when it does try to closing on itself it will actually fold on the alginate and not actually contact skin to skin. 02-09-2022 upon evaluation today patient appears to be doing about the same in regard to his wound. Again I do not think the dressings being quite put on correctly keeping the alginate in contact with the wound bed this just does not seem to be quite doing the job. I think we may need to switch to Dakin's moistened gauze dressing this is a very precarious location I am not even sure that he is not getting some feces into this dressing and underneath. I think it is probably going to be changed daily and switch again to the Dakin's. 02-23-2022 upon evaluation  today patient unfortunately still continues to have issues with his sacral area. Again this is a very precarious spot but honestly I think is made worse by the fact that he is up sitting the majority of the day. He  typically gets up by 10 he tells me and does not go to bed until around 11 or 12 this is a good solid at least 12 to 14 hours that he is up day in and day out. With that being said I think that this is probably one of the main reasons why this wound is not healing the other being obviously moisture control I do think the surface of the wound looks better this week compared to previous with the Dakin's but at the same time the wound itself does not appear to be any smaller. 03-12-2022 upon evaluation today patient appears to be doing poorly currently in regard to the wound and sacral region. Unfortunately this is not showing signs of improvement and he actually has a small area of deep tissue injury right in the middle right at the point where the bone of the coccyx is pushing onto the wound bed. Again I think that this is likely end up opening up into a much deeper wound that extends down to bone if he is unable to get off of this. So far he has not been able to really do this effectively tells me that he has been "trying" but only succeeding minimally. Electronic Signature(s) Signed: 03/12/2022 3:25:24 PM By: Worthy Keeler PA-C Entered By: Worthy Keeler on 03/12/2022 15:25:24 [facility], Deontae ([id number]) -------------------------------------------------------------------------------- Physical Exam Details Patient Name: [name] Date of Service: 03/12/2022 2:45 PM Medical Record Number: [id number] Patient Account Number: [id number] Date of Birth/Sex: [birth year] (66 y.o. M) Treating RN: Carlene Coria Primary Care Provider: Leward Quan Other Clinician: Referring Provider: Leward Quan Treating Provider/Extender: Skipper Cliche in Treatment: 6 Constitutional Well-nourished and well-hydrated in no acute distress. Respiratory normal breathing without difficulty. Psychiatric this patient is able to make decisions and demonstrates good insight into disease process. Alert and Oriented x 3.  pleasant and cooperative. Notes Upon inspection patient's wound bed actually showed signs of poor epithelization he does have some granulation tissue but there is also evidence of deep tissue injury in the central portion of the wound. I am concerned about the fact that this may end up opening and if he is not extremely cautious and stays off of this. I know they are trying to help him out at pace but I think they have offered potentially to have him come to pace more frequently in order to help with some of this offloading he does not seem to have the best support structure at home to be able to stay off of this as frequently as he would like to see I am extremely concerned this has been open into a much deeper wound and I did discuss this with him in great detail today. Electronic Signature(s) Signed: 03/12/2022 3:26:39 PM By: Worthy Keeler PA-C Entered By: Worthy Keeler on 03/12/2022 15:26:39 [facility], Khalib ([id number]) -------------------------------------------------------------------------------- Physician Orders Details Patient Name: [name] Date of Service: 03/12/2022 2:45 PM Medical Record Number: [id number] Patient Account Number: [id number] Date of Birth/Sex: March 18, 1956 (66 y.o. M) Treating RN: Carlene Coria Primary Care Provider: Leward Quan Other Clinician: Referring Provider: Leward Quan Treating Provider/Extender: Skipper Cliche in Treatment: 12 Verbal / Phone Orders: No Diagnosis Coding ICD-10 Coding Code Description L89.153 Pressure ulcer of sacral region, stage 3  G82.54 Quadriplegia, C5-C7 incomplete I48.0 Paroxysmal atrial fibrillation I95.89 Other hypotension Follow-up Appointments Wound #1 Midline Sacrum o Return Appointment in 2 weeks. Home Health Wound #1 [place] for wound care. May utilize formulary equivalent dressing for wound treatment orders unless otherwise specified. Home Health  Nurse may visit PRN to address patientos wound care needs. o Scheduled days for dressing changes to be completed; exception, patient has scheduled wound care visit that day. o **Please direct any NON-WOUND related issues/requests for orders to patient's Primary Care Physician. **If current dressing causes regression in wound condition, may D/C ordered dressing product/s and apply Normal Saline Moist Dressing daily until next [place] or Other MD appointment. **Notify [facility] of regression in wound condition at [phone]. Bathing/ Shower/ Hygiene o May shower; gently cleanse wound with antibacterial soap, rinse and pat dry prior to dressing wounds o No tub bath. Off-Loading o Roho cushion for wheelchair o Turn and reposition every 2 hours Additional Orders / Instructions o Follow Nutritious Diet and Increase Protein Intake Wound Treatment Wound #1 - Sacrum Wound Laterality: Midline Cleanser: Soap and Water 1 x Per Day/30 Days Discharge Instructions: Gently cleanse wound with antibacterial soap, rinse and pat dry prior to dressing wounds Primary Dressing: Hydrofera Blue Ready Transfer Foam, 4x5 (in/in) (Dispense As Written) 1 x Per Day/30 Days Discharge Instructions: Apply Hydrofera Blue Ready to wound bed as directed Secondary Dressing: (BORDER) Zetuvit Plus SILICONE BORDER Dressing 4x4 (in/in) 1 x Per Day/30 Days Discharge Instructions: Please do not put silicone bordered dressings under wraps. Use non-bordered dressing only. When applying please make sure skin is pulled flat so dressing stays in place. Electronic Signature(s) Signed: 03/12/2022 3:28:47 PM By: Carlene Coria RN Signed: 03/12/2022 4:44:22 PM By: Worthy Keeler PA-C Entered By: Carlene Coria on 03/12/2022 15:28:46 Mulhall, Samik ([id number]) -------------------------------------------------------------------------------- Problem List Details Patient Name: [name] Date of  Service: 03/12/2022 2:45 PM Medical Record Number: [id number] Patient Account Number: [id number] Date of Birth/Sex: [birth year] (66 y.o. M) Treating RN: Carlene Coria Primary Care Provider: Leward Quan Other Clinician: Referring Provider: Leward Quan Treating Provider/Extender: Skipper Cliche in Treatment: 12 Active Problems ICD-10 Encounter Code Description Active Date MDM Diagnosis L89.153 Pressure ulcer of sacral region, stage 3 12/18/2021 No Yes G82.54 Quadriplegia, C5-C7 incomplete 12/18/2021 No Yes I48.0 Paroxysmal atrial fibrillation 12/18/2021 No Yes I95.89 Other hypotension 12/18/2021 No Yes Inactive Problems Resolved Problems Electronic Signature(s) Signed: 03/12/2022 2:54:48 PM By: Worthy Keeler PA-C Entered By: Worthy Keeler on 03/12/2022 14:54:48 Kimberling, Janmichael ([id number]) -------------------------------------------------------------------------------- Progress Note Details Patient Name: [name] Date of Service: 03/12/2022 2:45 PM Medical Record Number: [id number] Patient Account Number: [id number] Date of Birth/Sex: 11-Dec-1955 (66 y.o. M) Treating RN: Carlene Coria Primary Care Provider: Leward Quan Other Clinician: Referring Provider: Leward Quan Treating Provider/Extender: Skipper Cliche in Treatment: 12 Subjective Chief Complaint Information obtained from Patient Sacral pressure ulcer History of Present Illness (HPI) 12-18-2021 upon evaluation today patient presents for initial evaluation here in the clinic concerning an issue he has been having over the sacral area where he has a wound this is a stage III pressure ulcer. It also appears there may still be some moisture component to this based on what I am seeing. I do not see any evidence of infection but at the same time I do believe that the patient is in need of some additional and hopefully better dressings in order to help this heal more  appropriately and quickly. Currently just a protective dressing has been  used over this sounds like may possibly be a border foam. The patient did have a CT scan which was negative for osteomyelitis he had a previous wound above this in the sacral region but not necessarily right of this area. 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 He also has atrial fibrillation and hypotension. 01-01-2022 upon evaluation today patient appears to be doing well with regard to his wound actually see some signs of improvement there is still quite a bit of feeling to be done here but nonetheless I think we are on the right track. I do not see any evidence of active infection locally or systemically at this time which is great news. 01-25-2022 upon evaluation today patient's wound really is not significantly better it actually appears about the same. Of note I do believe that part of the issue here is probably the fact that honestly it is hard to keep the dressing in place I think that when the alginate is put on it needs to be cut bigger than what is currently being seen on the dressing today probably by about 3 times that size and placed directly to the wound bed while spreading the butt cheeks bilaterally. This will allow for the dressing to be right intact with the wound bed so that when it does try to closing on itself it will actually fold on the alginate and not actually contact skin to skin. 02-09-2022 upon evaluation today patient appears to be doing about the same in regard to his wound. Again I do not think the dressings being quite put on correctly keeping the alginate in contact with the wound bed this just does not seem to be quite doing the job. I think we may need to switch to Dakin's moistened gauze dressing this is a very precarious location I am not even sure that he is not getting some feces into this dressing and underneath. I think it is probably going to be changed daily and switch again to the Dakin's. 02-23-2022 upon evaluation today  patient unfortunately still continues to have issues with his sacral area. Again this is a very precarious spot but honestly I think is made worse by the fact that he is up sitting the majority of the day. He typically gets up by 10 he tells me and does not go to bed until around 11 or 12 this is a good solid at least 12 to 14 hours that he is up day in and day out. With that being said I think that this is probably one of the main reasons why this wound is not healing the other being obviously moisture control I do think the surface of the wound looks better this week compared to previous with the Dakin's but at the same time the wound itself does not appear to be any smaller. 03-12-2022 upon evaluation today patient appears to be doing poorly currently in regard to the wound and sacral region. Unfortunately this is not showing signs of improvement and he actually has a small area of deep tissue injury right in the middle right at the point where the bone of the coccyx is pushing onto the wound bed. Again I think that this is likely end up opening up into a much deeper wound that extends down to bone if he is unable to get off of this. So far he has not been able to really  do this effectively tells me that he has been "trying" but only succeeding minimally. Objective Constitutional Well-nourished and well-hydrated in no acute distress. Vitals Time Taken: 2:57 PM, Height: 76 in, Weight: 167 lbs, BMI: 20.3, Temperature: 98 F, Pulse: 59 bpm, Respiratory Rate: 16 breaths/min, Blood Pressure: 81/55 mmHg. Respiratory normal breathing without difficulty. Psychiatric this patient is able to make decisions and demonstrates good insight into disease process. Alert and Oriented x 3. pleasant and cooperative. [facility], Ifeanyi ([id number]) General Notes: Upon inspection patient's wound bed actually showed signs of poor epithelization he does have some granulation tissue but there is also evidence of deep  tissue injury in the central portion of the wound. I am concerned about the fact that this may end up opening and if he is not extremely cautious and stays off of this. I know they are trying to help him out at pace but I think they have offered potentially to have him come to pace more frequently in order to help with some of this offloading he does not seem to have the best support structure at home to be able to stay off of this as frequently as he would like to see I am extremely concerned this has been open into a much deeper wound and I did discuss this with him in great detail today. Integumentary (Hair, Skin) Wound #1 status is Open. Original cause of wound was Pressure Injury. The date acquired was: 10/18/2021. The wound has been in treatment 12 weeks. The wound is located on the Midline Sacrum. The wound measures 5cm length x 0.5cm width x 0.3cm depth; 1.963cm^2 area and 0.589cm^3 volume. There is Fat Layer (Subcutaneous Tissue) exposed. There is no tunneling or undermining noted. There is a medium amount of serous drainage noted. The wound margin is flat and intact. There is large (67-100%) red granulation within the wound bed. There is a small (1-33%) amount of necrotic tissue within the wound bed including Adherent Slough. Assessment Active Problems ICD-10 Pressure ulcer of sacral region, stage 3 Quadriplegia, C5-C7 incomplete Paroxysmal atrial fibrillation Other hypotension Plan 1. I would recommend currently that the patient needs to be extremely aggressive and offloading. If he is not this is going to open up until much deeper wound and then what we want to see it all happened here. I want to get this closed but does not make it to be what were seen and he is actually having some deep tissue injury and central portion of the wound which is showing this would likely open up to bone if indeed this breaks down completely. I do not think referral from that. 2. I am also can recommend  that we have the patient continue with the [facility] we attempted Dakin's but unfortunately became too macerated the [facility] has done much better for him. We will see patient back for reevaluation in 2 weeks here in the clinic. If anything worsens or changes patient will contact our office for additional recommendations. Electronic Signature(s) Signed: 03/12/2022 3:27:26 PM By: Worthy Keeler PA-C Entered By: Worthy Keeler on 03/12/2022 15:27:26 [facility], [place] ([id number]) -------------------------------------------------------------------------------- SuperBill Details Patient Name: [name] Date of Service: 03/12/2022 Medical Record Number: [id number] Patient Account Number: [id number] Date of Birth/Sex: 1956/04/03 (66 y.o. M) Treating RN: Carlene Coria Primary Care Provider: Leward Quan Other Clinician: Referring Provider: Leward Quan Treating Provider/Extender: Skipper Cliche in Treatment: 12 Diagnosis Coding ICD-10 Codes Code Description [phone] Pressure ulcer of sacral region, stage 3 G82.54 Quadriplegia, C5-C7 incomplete  I48.0 Paroxysmal atrial fibrillation I95.89 Other hypotension Facility Procedures CPT4 Code: [id number] Description: [phone] - WOUND CARE VISIT-LEV 2 EST PT Modifier: Quantity: 1 Physician Procedures CPT4 Code: [id number] Description: 33582 - WC PHYS LEVEL 3 - EST PT Modifier: Quantity: 1 CPT4 Code: Description: ICD-10 Diagnosis Description L89.153 Pressure ulcer of sacral region, stage 3 G82.54 Quadriplegia, C5-C7 incomplete I48.0 Paroxysmal atrial fibrillation I95.89 Other hypotension Modifier: Quantity: Electronic Signature(s) Signed: 03/12/2022 3:29:40 PM By: Carlene Coria RN Signed: 03/12/2022 4:44:22 PM By: Worthy Keeler PA-C Previous Signature: 03/12/2022 3:28:11 PM Version By: Worthy Keeler PA-C Previous Signature: 03/12/2022 3:27:51 PM Version By: Worthy Keeler PA-C Entered By: Carlene Coria on 03/12/2022 15:29:40

## 2022-03-12 NOTE — Progress Notes (Signed)
03/16/22 8:59 AM   [name] 1956/01/17 [id number]  Referring provider:  Marnee [name], [address] [place] [place],  [place] [address]  Urological history  1.  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 Left renal cysts -RUS 03/2021 - Stable left renal cysts -RUS scheduled for 03/27/2022  Chief Complaint  Patient presents with   Results    HPI: [name] is a 66 y.o.male who presents today to discuss prostate biopsy results with his sister, [name].   He underwent transrectal digital prostate biopsies in the OR on 02/27/2022 w/ Dr. Bernardo [name].  6/6 cores positive.  Gleason's 4 + 3 w/ perineural invasion present in right apex core,  Gleason's 4 + 3 in left apex core and perineural invasion w/ Gleason's 3 + 4 in the left mid core.    Prostate MRI (11/21/2021) - Volume: 3D volumetric analysis: Prostate volume 28.96 cc (5.3 by 3.5 by 3.8 cm).  Transcapsular spread: Likely present especially along region of interest # 1 given the amount of capsular contact.  Seminal vesicle involvement: Not observed  Neurovascular bundle involvement: Not observed  Pelvic adenopathy: Not observed Bone metastasis: Absent.  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 Region of interest # 1 has a large amount of capsular contact raising the risk of transcapsular spread.  Targeting data sent to [name].    CT pelvis w/contrast (12/04/2021) - No acute pelvic pathology. No drainable fluid collection or abscess.  Postsurgical changes of the bowel with a left lower quadrant ostomy. No bowel dilatation.  PSMA-PET/CT (01/18/2022) - Foci of radiotracer activity in the prostate correspond with the lesion seen on prior MRI reflecting prostate neoplasm.  No evidence of radiotracer avid local nodal or distant metastatic disease.  Ill-defined nodular focus  along the left posterolateral aspect of the urinary bladder with an adjacent bladder calculus, consider further evaluation with cystoscopy to exclude bladder wall lesion if clinically indicated.  Cystoscopy (02/27/2022) - urethra normal in caliber without stricture.  Moderate lateral lobe enlargement/bladder neck elevation. Trabeculated bladder with fine crystalline particles noted.  No significant calculus noted.  No bladder mucosal abnormalities or mass noted   PMH: Past Medical History:  Diagnosis Date   Anemia    Aortic atherosclerosis (HCC)    B12 deficiency    Bladder stones    Colostomy present (HCC)    Constipated    Elevated PSA    HLD (hyperlipidemia)    Hypotension    a.) on midodrine   Neurogenic bladder    Olecranon bursitis    PAF (paroxysmal atrial fibrillation) (HCC)    a.) CHA2DS2VASc = 2 (age, vascular disease history);  b.) rate/rhythm maintained on oral cardizem; no chronic anticoagulation   Quadriplegia, C5-C7, incomplete (HCC)    Renal cyst, left    Sacral decubitus ulcer    SIRS (systemic inflammatory response syndrome) ([place]) 06/08/2013   Spinal cord injury, C5-C7 ([place])    Vitamin D deficiency    Wheelchair dependent     Surgical History: Past Surgical History:  Procedure Laterality Date   CYSTOSCOPY WITH LITHOLAPAXY N/A 02/27/2022   Procedure: CYSTOSCOPY;  Surgeon: Michael Sons, MD;  Location: [facility] ORS;  Service: Urology;  Laterality: N/A;   NECK SURGERY  1976   PROSTATE BIOPSY N/A 02/27/2022   Procedure: PROSTATE BIOPSY;  Surgeon: Michael Sons, MD;  Location: [facility] ORS;  Service: Urology;  Laterality: N/A;    Home Medications:  Allergies as of 03/13/2022   No Known Allergies      Medication List        Accurate as of March 13, 2022 11:59 PM. If you have any questions, ask your nurse or doctor.          STOP taking these medications    amoxicillin-clavulanate 875-125 MG tablet Commonly known as: AUGMENTIN       TAKE  these medications    Cholecalciferol 25 MCG (1000 UT) tablet Take by mouth.   diltiazem 60 MG tablet Commonly known as: CARDIZEM Take by mouth.   docusate sodium 100 MG capsule Commonly known as: COLACE Take 100 mg by mouth daily as needed.   gabapentin 300 MG capsule Commonly known as: NEURONTIN Take 300 mg by mouth at bedtime.   midodrine 2.5 MG tablet Commonly known as: PROAMATINE Take 2.5 mg by mouth daily.   potassium chloride 10 MEQ tablet Commonly known as: KLOR-CON Take by mouth.   VITAMIN B 12 PO Take by mouth.        Allergies:  No Known Allergies  Family History: No family history on file.  Social History:  reports that he has never smoked. He has never used smokeless tobacco. He reports that he does not currently use alcohol. He reports that he does not use drugs.   Physical Exam: BP (!) 87/60   Pulse (!) 114   Constitutional:  Well nourished. Alert and oriented, No acute distress. HEENT: [place] AT, moist mucus membranes.  Trachea midline Cardiovascular: No clubbing, cyanosis, or edema. Respiratory: Normal respiratory effort, no increased work of breathing. Psychiatric: Normal mood and affect.   Laboratory Data: Component 13 d ago  SURGICAL PATHOLOGY SURGICAL PATHOLOGY  CASE: [id number]  PATIENT: [name]  Surgical Pathology Report      Specimen Submitted:  A. Prostate, left base  B. Prostate, left mid  C. Prostate, left apex  D. Prostate, right base  E. Prostate, right mid  F. Prostate, right apex   Clinical History: Elevated prostate specific antigen, bladder calculous     DIAGNOSIS:  A. PROSTATE GLAND, LEFT BASE; BIOPSY:  - ACINAR ADENOCARCINOMA OF THE PROSTATE, GLEASON SCORE 3+4 = 7 (GRADE  GROUP 2), INVOLVING 1 OF 1 CORES, MEASURING 10 MM (100%), 30% GLEASON  PATTERN 4.   B. PROSTATE GLAND, LEFT MID; BIOPSY:  - ACINAR ADENOCARCINOMA OF THE PROSTATE, GLEASON SCORE 3+4 = 7 (GRADE  GROUP 2), INVOLVING 1 OF 1 CORES,  MEASURING 11 MM (73%), 30% GLEASON  PATTERN 4, PERINEURAL INVASION PRESENT.   C. PROSTATE GLAND, LEFT APEX; BIOPSY:  - ACINAR ADENOCARCINOMA OF THE PROSTATE, GLEASON SCORE 4+3 EQUAL 7  (GRADE GROUP 3), INVOLVING 1 OF 1 CORES, MEASURING 15 MM (100%), 70%  GLEASON PATTERN 4.   D. PROSTATE GLAND, RIGHT BASE; BIOPSY:  - ACINAR ADENOCARCINOMA OF THE PROSTATE, GLEASON SCORE 3+4 = 7 (GRADE  GROUP 2), INVOLVING 1 OF 1 CORES, MEASURING 9 MM (67%), 20% GLEASON  PATTERN 4.   E. PROSTATE GLAND, RIGHT MID; BIOPSY:  - ACINAR ADENOCARCINOMA OF THE PROSTATE, GLEASON SCORE 3+4 = 7 (GRADE  GROUP 3), INVOLVING 1 OF 1 CORES, MEASURING 12 MM (100%), 45% GLEASON  PATTERN 4.   F. PROSTATE GLAND, RIGHT APEX; BIOPSY:  - ACINAR ADENOCARCINOMA OF THE PROSTATE, GLEASON SCORE 4+3 EQUAL 7  (GRADE GROUP 3), INVOLVING 1 OF 1 CORES, MEASURING 15 MM (100%), 70%  GLEASON PATTERN 4, PERINEURAL INVASION PRESENT.  GROSS DESCRIPTION:  A. Labeled: Left base  Received: Formalin  Collection time: 12:16 PM on 02/27/2022  Placed into formalin time: 12:18 PM on 02/27/2022  Number of needle core biopsy(s): 1  Length: 1.8 cm  Diameter: 0.1 cm  Description: Needle core biopsy fragment of tan soft tissue  Ink: Blue  Entirely submitted in 1 cassette.   B. Labeled: Left mid  Received: Formalin  Collection time: 12:16 PM on 02/27/2022  Placed into formalin time: 12:18 PM on 02/27/2022  Number of needle core biopsy(s): 1  Length: 2.1 cm  Diameter: 0.1 cm  Description: Needle core biopsy fragment of tan soft tissue  Ink: Blue  Entirely submitted in 1 cassette.   C. Labeled: Left apex  Received: Formalin  Collection time: 12:16 PM on 02/27/2022  Placed into formalin time: 12:18 PM on 02/27/2022  Number of needle core biopsy(s): 1  Length: 2 cm  Diameter: 0.1 cm  Description: Needle core biopsy fragment of tan soft tissue  Ink: Blue  Entirely submitted in 1 cassette.   D. Labeled: Right base  Received: Formalin   Collection time: 12:16 PM on 02/27/2022  Placed into formalin time: 12:18 PM on 02/27/2022  Number of needle core biopsy(s): 1  Length: 2.2 cm  Diameter: 0.1 cm  Description: Needle core biopsy fragment of tan soft tissue  Ink: Blue  Entirely submitted in 1 cassette.   E. Labeled: Right mid  Received: Formalin  Collection time: 12:16 PM on 02/27/2022  Placed into formalin time: 12:18 PM on 02/27/2022  Number of needle core biopsy(s): 1  Length: 1.6 cm  Diameter: 0.1 cm  Description: Needle core biopsy fragment of tan soft tissue  Ink: Blue  Entirely submitted in 1 cassette.   F. Labeled: Right apex  Received: Formalin  Collection time: 12:16 PM on 02/27/2022  Placed into formalin time: 12:18 PM on 02/27/2022  Number of needle core biopsy(s): 1  Length: 1.9 cm  Diameter: 0.1 cm  Description: Needle core biopsy fragment of tan soft tissue  Ink: Blue  Entirely submitted in 1 cassette.   RB 02/27/2022   Final Diagnosis performed by Allena Napoleon, MD.   Electronically signed  02/28/2022 11:37:56AM  The electronic signature indicates that the named Attending Pathologist  has evaluated the specimen  Technical component performed at [facility], [address] [place],  [place] [address] Lab: [phone] Dir: Rush Farmer, MD, MMM   Professional component performed at [name], [facility] [name], [place], [place], [place] [address] Lab: [id number]  Dir: Kathi Simpers, MD   Resulting Agency [name]         Specimen Collected: 02/27/22 12:16 Last Resulted: 02/28/22 11:54        I have reviewed the labs.   Pertinent Imaging: N/A   Assessment & Plan:    1. Prostate cancer -Unfavorable intermediate risk prostate cancer -He is a challenging case due to his quadriplegia and contractures, his hypotensive issues, colostomy and an issue with a sacral decubitus ulcer that is currently a stage III that is not showing signs of improvement and the wound center  is concerned that it will eventually extend into his coccyx -we discussed the recommended treatments available (robotic prostatectomy or EBRT + ADT or brachytherapy) -Explained that brachytherapy may be difficult due to his contractures making it difficult for him to be placed in the dorsal lithotomy position  -Explained that risks of a prostatectomy could be erectile dysfunction and/or incontinence -after our discussion, he would  to see if he is a candidate for radiation therapy -refer to radiology oncology for further discussion on radiation treatments for his prostate cancer  Return for refer to Dr. Brett Fairy, [place] [address] [place], [place] [address] [phone]  I spent 30 minutes on the day of the encounter to include pre-visit record review, face-to-face time with the patient, and post-visit ordering of tests.

## 2022-03-12 NOTE — Progress Notes (Signed)
[name], [name] ([id number]) Visit Report for 03/12/2022 Arrival Information Details Patient Name: [name], [name] Date of Service: 03/12/2022 2:45 PM Medical Record Number: [id number] Patient Account Number: [id number] Date of Birth/Sex: [birth year] (66 y.o. M) Treating RN: Carlene Coria Primary Care Oris Staffieri: Leward Quan Other Clinician: Referring Azavier Creson: Leward Quan Treating Peyton Spengler/Extender: Skipper Cliche in Treatment: 12 Visit Information History Since Last Visit All ordered tests and consults were completed: No Patient Arrived: Wheel Chair Added or deleted any medications: No Arrival Time: 14:22 Any new allergies or adverse reactions: No Accompanied By: sister Had a fall or experienced change in No Transfer Assistance: Civil Service fast streamer activities of daily living that may affect Patient Identification Verified: Yes risk of falls: Secondary Verification Process Completed: Yes Signs or symptoms of abuse/neglect since last visito No Patient Requires Transmission-Based Precautions: No Hospitalized since last visit: No Patient Has Alerts: Yes Implantable device outside of the clinic excluding No Patient Alerts: NOT DIABETIC cellular tissue based products placed in the center since last visit: Has Dressing in Place as Prescribed: Yes Pain Present Now: No Electronic Signature(s) Signed: 03/12/2022 4:20:15 PM By: Carlene Coria RN Entered By: Carlene Coria on 03/12/2022 14:54:10 [name], [name] ([id number]) -------------------------------------------------------------------------------- Clinic Level of Care Assessment Details Patient Name: [name] Date of Service: 03/12/2022 2:45 PM Medical Record Number: [id number] Patient Account Number: [id number] Date of Birth/Sex: [birth year] (66 y.o. M) Treating RN: Carlene Coria Primary Care Yong Wahlquist: Leward Quan Other Clinician: Referring Mayda Shippee: Leward Quan Treating Jerret Mcbane/Extender: Skipper Cliche in Treatment: 12 Clinic Level of Care  Assessment Items TOOL 4 Quantity Score X - Use when only an EandM is performed on FOLLOW-UP visit 1 0 ASSESSMENTS - Nursing Assessment / Reassessment X - Reassessment of Co-morbidities (includes updates in patient status) 1 10 X- 1 5 Reassessment of Adherence to Treatment Plan ASSESSMENTS - Wound and Skin Assessment / Reassessment X - Simple Wound Assessment / Reassessment - one wound 1 5 '[]'  - 0 Complex Wound Assessment / Reassessment - multiple wounds '[]'  - 0 Dermatologic / Skin Assessment (not related to wound area) ASSESSMENTS - Focused Assessment '[]'  - Circumferential Edema Measurements - multi extremities 0 '[]'  - 0 Nutritional Assessment / Counseling / Intervention '[]'  - 0 Lower Extremity Assessment (monofilament, tuning fork, pulses) '[]'  - 0 Peripheral Arterial Disease Assessment (using hand held doppler) ASSESSMENTS - Ostomy and/or Continence Assessment and Care '[]'  - Incontinence Assessment and Management 0 '[]'  - 0 Ostomy Care Assessment and Management (repouching, etc.) PROCESS - Coordination of Care X - Simple Patient / Family Education for ongoing care 1 15 '[]'  - 0 Complex (extensive) Patient / Family Education for ongoing care '[]'  - 0 Staff obtains Programmer, systems, Records, Test Results / Process Orders '[]'  - 0 Staff telephones HHA, Nursing Homes / Clarify orders / etc '[]'  - 0 Routine Transfer to another Facility (non-emergent condition) '[]'  - 0 Routine Hospital Admission (non-emergent condition) '[]'  - 0 New Admissions / Biomedical engineer / Ordering NPWT, Apligraf, etc. '[]'  - 0 Emergency Hospital Admission (emergent condition) X- 1 10 Simple Discharge Coordination '[]'  - 0 Complex (extensive) Discharge Coordination PROCESS - Special Needs '[]'  - Pediatric / Minor Patient Management 0 '[]'  - 0 Isolation Patient Management '[]'  - 0 Hearing / Language / Visual special needs '[]'  - 0 Assessment of Community assistance (transportation, D/C planning, etc.) '[]'  - 0 Additional  assistance / Altered mentation '[]'  - 0 Support Surface(s) Assessment (bed, cushion, seat, etc.) INTERVENTIONS - Wound Cleansing / Measurement [name], [name] ([id number]) X- 1 5 Simple Wound Cleansing -  one wound '[]'  - 0 Complex Wound Cleansing - multiple wounds X- 1 5 Wound Imaging (photographs - any number of wounds) '[]'  - 0 Wound Tracing (instead of photographs) X- 1 5 Simple Wound Measurement - one wound '[]'  - 0 Complex Wound Measurement - multiple wounds INTERVENTIONS - Wound Dressings X - Small Wound Dressing one or multiple wounds 1 10 '[]'  - 0 Medium Wound Dressing one or multiple wounds '[]'  - 0 Large Wound Dressing one or multiple wounds '[]'  - 0 Application of Medications - topical '[]'  - 0 Application of Medications - injection INTERVENTIONS - Miscellaneous '[]'  - External ear exam 0 '[]'  - 0 Specimen Collection (cultures, biopsies, blood, body fluids, etc.) '[]'  - 0 Specimen(s) / Culture(s) sent or taken to Lab for analysis '[]'  - 0 Patient Transfer (multiple staff / Civil Service fast streamer / Similar devices) '[]'  - 0 Simple Staple / Suture removal (25 or less) '[]'  - 0 Complex Staple / Suture removal (26 or more) '[]'  - 0 Hypo / Hyperglycemic Management (close monitor of Blood Glucose) '[]'  - 0 Ankle / Brachial Index (ABI) - do not check if billed separately X- 1 5 Vital Signs Has the patient been seen at the hospital within the last three years: Yes Total Score: 75 Level Of Care: New/Established - Level 2 Electronic Signature(s) Signed: 03/12/2022 4:20:15 PM By: Carlene Coria RN Entered By: Carlene Coria on 03/12/2022 15:29:26 [name], [name] ([id number]) -------------------------------------------------------------------------------- Encounter Discharge Information Details Patient Name: [name] Date of Service: 03/12/2022 2:45 PM Medical Record Number: [id number] Patient Account Number: [id number] Date of Birth/Sex: 1956-03-01 (66 y.o. M) Treating RN: Carlene Coria Primary Care  Demaryius Imran: Leward Quan Other Clinician: Referring Jailen Lung: Leward Quan Treating Meyer Dockery/Extender: Skipper Cliche in Treatment: 12 Encounter Discharge Information Items Discharge Condition: Stable Ambulatory Status: Wheelchair Discharge Destination: Home Transportation: Private Auto Accompanied By: sister Schedule Follow-up Appointment: Yes Clinical Summary of Care: Electronic Signature(s) Signed: 03/12/2022 3:30:43 PM By: Carlene Coria RN Entered By: Carlene Coria on 03/12/2022 15:30:43 [facility], [place] ([id number]) -------------------------------------------------------------------------------- Lower Extremity Assessment Details Patient Name: [name] Date of Service: 03/12/2022 2:45 PM Medical Record Number: [id number] Patient Account Number: [id number] Date of Birth/Sex: 02-28-1956 (66 y.o. M) Treating RN: Carlene Coria Primary Care Alexiana Laverdure: Leward Quan Other Clinician: Referring Pepper Kerrick: Leward Quan Treating Joretta Eads/Extender: Skipper Cliche in Treatment: 12 Electronic Signature(s) Signed: 03/12/2022 3:02:00 PM By: Carlene Coria RN Entered By: Carlene Coria on 03/12/2022 15:02:00 [facility], [place] ([id number]) -------------------------------------------------------------------------------- Multi Wound Chart Details Patient Name: [name] Date of Service: 03/12/2022 2:45 PM Medical Record Number: [id number] Patient Account Number: [id number] Date of Birth/Sex: 05-19-1956 (66 y.o. M) Treating RN: Carlene Coria Primary Care Jerriyah Louis: Leward Quan Other Clinician: Referring Jayr Lupercio: Leward Quan Treating Alayzha An/Extender: Skipper Cliche in Treatment: 12 Vital Signs Height(in): 76 Pulse(bpm): 36 Weight(lbs): 167 Blood Pressure(mmHg): 81/55 Body Mass Index(BMI): 20.3 Temperature(F): 98 Respiratory Rate(breaths/min): 16 Photos: [N/A:N/A] Wound Location: Midline Sacrum N/A N/A Wounding Event: Pressure Injury N/A N/A Primary Etiology: Pressure Ulcer N/A N/A Comorbid  History: Arrhythmia, Hypotension, History of N/A N/A pressure wounds, Quadriplegia Date Acquired: 10/18/2021 N/A N/A Weeks of Treatment: 12 N/A N/A Wound Status: Open N/A N/A Wound Recurrence: No N/A N/A Measurements L x W x D (cm) 5x0.5x0.3 N/A N/A Area (cm) : 1.963 N/A N/A Volume (cm) : 0.589 N/A N/A % Reduction in Area: 15.00% N/A N/A % Reduction in Volume: -27.50% N/A N/A Classification: Category/Stage III N/A N/A Exudate Amount: Medium N/A N/A Exudate Type: Serous N/A N/A Exudate Color: amber N/A N/A Wound  Margin: Flat and Intact N/A N/A Granulation Amount: Large (67-100%) N/A N/A Granulation Quality: Red N/A N/A Necrotic Amount: Small (1-33%) N/A N/A Exposed Structures: Fat Layer (Subcutaneous Tissue): N/A N/A Yes Fascia: No Tendon: No Muscle: No Joint: No Bone: No Epithelialization: None N/A N/A Treatment Notes Electronic Signature(s) Signed: 03/12/2022 3:02:25 PM By: Carlene Coria RN Entered By: Carlene Coria on 03/12/2022 15:02:25 [name], [name] ([id number]) -------------------------------------------------------------------------------- [place] Details Patient Name: [name] Date of Service: 03/12/2022 2:45 PM Medical Record Number: [id number] Patient Account Number: [id number] Date of Birth/Sex: 02/20/1956 (66 y.o. M) Treating RN: Carlene Coria Primary Care Berthe Oley: Leward Quan Other Clinician: Referring Terril Amaro: Leward Quan Treating Demesha Boorman/Extender: Skipper Cliche in Treatment: 12 Active Inactive Pressure Nursing Diagnoses: Knowledge deficit related to management of pressures ulcers Goals: Patient will remain free from development of additional pressure ulcers Date Initiated: 12/18/2021 Target Resolution Date: 12/18/2021 Goal Status: Active Patient will remain free of pressure ulcers Date Initiated: 12/18/2021 Target Resolution Date: 12/18/2021 Goal Status: Active Patient/caregiver will verbalize risk factors for pressure ulcer  development Date Initiated: 12/18/2021 Target Resolution Date: 12/18/2021 Goal Status: Active Patient/caregiver will verbalize understanding of pressure ulcer management Date Initiated: 12/18/2021 Target Resolution Date: 12/18/2021 Goal Status: Active Interventions: Assess: immobility, friction, shearing, incontinence upon admission and as needed Assess offloading mechanisms upon admission and as needed Assess potential for pressure ulcer upon admission and as needed Provide education on pressure ulcers Treatment Activities: Patient referred for seating evaluation to ensure proper offloading : 12/18/2021 Notes: Wound/Skin Impairment Nursing Diagnoses: Impaired tissue integrity Knowledge deficit related to smoking impact on wound healing Knowledge deficit related to ulceration/compromised skin integrity Goals: Patient/caregiver will verbalize understanding of skin care regimen Date Initiated: 12/18/2021 Date Inactivated: 01/25/2022 Target Resolution Date: 12/18/2021 Goal Status: Met Ulcer/skin breakdown will have a volume reduction of 30% by week 4 Date Initiated: 12/18/2021 Target Resolution Date: 01/15/2022 Goal Status: Active Interventions: Assess ulceration(s) every visit Treatment Activities: Referred to DME Lizeth Bencosme for dressing supplies : 12/18/2021 Skin care regimen initiated : 12/18/2021 Topical wound management initiated : 12/18/2021 [name], [name] ([id number]) Notes: Electronic Signature(s) Signed: 03/12/2022 3:02:05 PM By: Carlene Coria RN Entered By: Carlene Coria on 03/12/2022 15:02:04 [facility], [place] ([id number]) -------------------------------------------------------------------------------- Pain Assessment Details Patient Name: [name] Date of Service: 03/12/2022 2:45 PM Medical Record Number: [id number] Patient Account Number: [id number] Date of Birth/Sex: [birth year] (66 y.o. M) Treating RN: Carlene Coria Primary Care Freeland Pracht: Leward Quan Other Clinician: Referring Maryse Brierley:  Leward Quan Treating Titiana Severa/Extender: Skipper Cliche in Treatment: 12 Active Problems Location of Pain Severity and Description of Pain Patient Has Paino No Site Locations Pain Management and Medication Current Pain Management: Electronic Signature(s) Signed: 03/12/2022 4:20:15 PM By: Carlene Coria RN Entered By: Carlene Coria on 03/12/2022 14:54:37 [name], [name] ([id number]) -------------------------------------------------------------------------------- Patient/Caregiver Education Details Patient Name: [name] Date of Service: 03/12/2022 2:45 PM Medical Record Number: [id number] Patient Account Number: [id number] Date of Birth/Gender: 10/10/1955 (66 y.o. M) Treating RN: Carlene Coria Primary Care Physician: Leward Quan Other Clinician: Referring Physician: Leward Quan Treating Physician/Extender: Skipper Cliche in Treatment: 12 Education Assessment Education Provided To: Patient Education Topics Provided Pressure: Methods: Explain/Verbal Responses: State content correctly Electronic Signature(s) Signed: 03/12/2022 4:20:15 PM By: Carlene Coria RN Entered By: Carlene Coria on 03/12/2022 15:29:50 [facility], [place] ([id number]) -------------------------------------------------------------------------------- Wound Assessment Details Patient Name: [name] Date of Service: 03/12/2022 2:45 PM Medical Record Number: [id number] Patient Account Number: [id number] Date of Birth/Sex: 09-May-1956 (66 y.o. M) Treating RN: Carlene Coria Primary Care Babbette Dalesandro: Leward Quan Other  Clinician: Referring Ladasia Sircy: Leward Quan Treating Olivier Frayre/Extender: Skipper Cliche in Treatment: 12 Wound Status Wound Number: 1 Primary Pressure Ulcer Etiology: Wound Location: Midline Sacrum Wound Status: Open Wounding Event: Pressure Injury Comorbid Arrhythmia, Hypotension, History of pressure wounds, Date Acquired: 10/18/2021 History: Quadriplegia Weeks Of Treatment: 12 Clustered Wound:  No Photos Wound Measurements Length: (cm) 5 Width: (cm) 0.5 Depth: (cm) 0.3 Area: (cm) 1.963 Volume: (cm) 0.589 % Reduction in Area: 15% % Reduction in Volume: -27.5% Epithelialization: None Tunneling: No Undermining: No Wound Description Classification: Category/Stage III Wound Margin: Flat and Intact Exudate Amount: Medium Exudate Type: Serous Exudate Color: amber Foul Odor After Cleansing: No Slough/Fibrino Yes Wound Bed Granulation Amount: Large (67-100%) Exposed Structure Granulation Quality: Red Fascia Exposed: No Necrotic Amount: Small (1-33%) Fat Layer (Subcutaneous Tissue) Exposed: Yes Necrotic Quality: Adherent Slough Tendon Exposed: No Muscle Exposed: No Joint Exposed: No Bone Exposed: No Treatment Notes Wound #1 (Sacrum) Wound Laterality: Midline Cleanser Soap and Water Discharge Instruction: Gently cleanse wound with antibacterial soap, rinse and pat dry prior to dressing wounds Peri-Wound Care [facility], [name] ([id number]) Topical Primary Dressing Hydrofera Blue Ready Transfer Foam, 4x5 (in/in) Discharge Instruction: Apply Hydrofera Blue Ready to wound bed as directed Secondary Dressing (BORDER) Zetuvit Plus SILICONE BORDER Dressing 4x4 (in/in) Discharge Instruction: Please do not put silicone bordered dressings under wraps. Use non-bordered dressing only. When applying please make sure skin is pulled flat so dressing stays in place. Secured With Compression Wrap Compression Stockings Environmental education officer) Signed: 03/12/2022 3:01:35 PM By: Carlene Coria RN Entered By: Carlene Coria on 03/12/2022 15:01:35 [facility], [place] ([id number]) -------------------------------------------------------------------------------- Vitals Details Patient Name: [name] Date of Service: 03/12/2022 2:45 PM Medical Record Number: [id number] Patient Account Number: [id number] Date of Birth/Sex: February 17, 1956 (66 y.o. M) Treating RN: Carlene Coria Primary Care  Zenola Dezarn: Leward Quan Other Clinician: Referring Dasani Thurlow: Leward Quan Treating Rodneshia Greenhouse/Extender: Skipper Cliche in Treatment: 12 Vital Signs Time Taken: 14:57 Temperature (F): 98 Height (in): 76 Pulse (bpm): 59 Weight (lbs): 167 Respiratory Rate (breaths/min): 16 Body Mass Index (BMI): 20.3 Blood Pressure (mmHg): 81/55 Reference Range: 80 - 120 mg / dl Electronic Signature(s) Signed: 03/12/2022 4:20:15 PM By: Carlene Coria RN Entered By: Carlene Coria on 03/12/2022 14:54:30

## 2022-03-13 ENCOUNTER — Ambulatory Visit (INDEPENDENT_AMBULATORY_CARE_PROVIDER_SITE_OTHER): Payer: Medicare (Managed Care) | Admitting: Urology

## 2022-03-13 VITALS — BP 87/60 | HR 114

## 2022-03-13 DIAGNOSIS — C61 Malignant neoplasm of prostate: Secondary | ICD-10-CM | POA: Diagnosis not present

## 2022-03-15 ENCOUNTER — Telehealth: Payer: Self-pay | Admitting: Urology

## 2022-03-15 NOTE — Telephone Encounter (Signed)
Updated [name] on Mr. Thomann status and biopsy results.

## 2022-03-16 ENCOUNTER — Encounter: Payer: Self-pay | Admitting: Urology

## 2022-03-21 ENCOUNTER — Ambulatory Visit
Admission: RE | Admit: 2022-03-21 | Discharge: 2022-03-21 | Disposition: A | Discharge status: Home / Self Care | Payer: Medicare (Managed Care) | Source: Ambulatory Visit | Attending: Radiation Oncology | Admitting: Radiation Oncology

## 2022-03-21 VITALS — BP 93/70 | HR 68 | Temp 95.0°F | Resp 18 | Ht 76.0 in

## 2022-03-21 DIAGNOSIS — C61 Malignant neoplasm of prostate: Secondary | ICD-10-CM

## 2022-03-21 NOTE — Consult Note (Signed)
NEW PATIENT EVALUATION  Name: [name]  MRN: [id number]  Date:   03/21/2022     DOB: 03/29/1956   This 66 y.o. 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  20  REFERRING PHYSICIAN: Marnee Guarneri, MD  CHIEF COMPLAINT:  Chief Complaint  Patient presents with   Prostate Cancer    Follow up    DIAGNOSIS: There were no encounter diagnoses.   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  He was noted to have an elevated PSA and of 20.  Prostate MRI showed 3 PI-RADS category 5 lesions present the prostate gland compatible with malignancy.  There was a large amount of capsular contact raising the risk of trans capsular spread.  He had a CT scan showing no acute pelvic pathology postsurgical changes of the bowel with left lower quadrant ostomy.  PSMA PET scan showed foci of radiotracer activity in the prostate because corresponding lesion seen on MRI no evidence of nodal or distant metastatic disease.  He underwent biopsy showing 6 of 6 cores positive for mixture of Gleason 7 (4+3) and Gleason 7 (3+4).  Patient has declined surgical intervention and is now referred to radiation oncology for opinion.  He states he is having no significant lower urinary tract symptoms or bone pain.  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  Also has a significant decubitus ulcer.  PLANNED TREATMENT REGIMEN: Possible external beam image guided IMRT radiation  PAST MEDICAL HISTORY:  has a past medical history of Anemia, Aortic atherosclerosis ([place]), B12 deficiency, Bladder stones, Colostomy present ([place]), Constipated, Elevated PSA, HLD (hyperlipidemia), Hypotension, Neurogenic  bladder, Olecranon bursitis, PAF (paroxysmal atrial fibrillation) ([place]), Quadriplegia, C5-C7, incomplete ([place]), Renal cyst, left, Sacral decubitus ulcer, SIRS (systemic inflammatory response syndrome) ([place]) (06/08/2013), Spinal cord injury, C5-C7 ([place]), Vitamin D deficiency, and Wheelchair dependent.    PAST SURGICAL HISTORY:  Past Surgical History:  Procedure Laterality Date   CYSTOSCOPY WITH LITHOLAPAXY N/A 02/27/2022   Procedure: CYSTOSCOPY;  Surgeon: Abbie Sons, MD;  Location: [facility] ORS;  Service: Urology;  Laterality: N/A;   NECK SURGERY  1976   PROSTATE BIOPSY N/A 02/27/2022   Procedure: PROSTATE BIOPSY;  Surgeon: Abbie Sons, MD;  Location: [facility] ORS;  Service: Urology;  Laterality: N/A;    FAMILY HISTORY: family history is not on file.  SOCIAL HISTORY:  reports that he has never smoked. He has never used smokeless tobacco. He reports that he does not currently use alcohol. He reports that he does not use drugs.  ALLERGIES: Patient has no known allergies.  MEDICATIONS:  Current Outpatient Medications  Medication Sig Dispense Refill   Cholecalciferol 25 MCG (1000 UT) tablet Take by mouth.     Cyanocobalamin (VITAMIN B 12 PO) Take by mouth.     diltiazem (CARDIZEM) 60 MG tablet Take by mouth.     docusate sodium (COLACE) 100 MG capsule Take 100 mg by mouth daily as needed.     gabapentin (NEURONTIN) 300 MG capsule Take 300 mg by mouth at bedtime.     midodrine (PROAMATINE) 2.5 MG tablet Take 2.5 mg by mouth daily.     potassium chloride (KLOR-CON) 10 MEQ tablet Take by mouth.  No current facility-administered medications for this encounter.    ECOG PERFORMANCE STATUS:  0 - Asymptomatic  REVIEW OF SYSTEMS: Patient does have C7 injury resulting in quadriplegia.  He does have a colostomy. Patient denies any weight loss, fatigue, weakness, fever, chills or night sweats. Patient denies any loss of vision, blurred vision. Patient denies any ringing  of the ears or hearing  loss. No irregular heartbeat. Patient denies heart murmur or history of fainting. Patient denies any chest pain or pain radiating to her upper extremities. Patient denies any shortness of breath, difficulty breathing at night, cough or hemoptysis. Patient denies any swelling in the lower legs. Patient denies any nausea vomiting, vomiting of blood, or coffee ground material in the vomitus. Patient denies any stomach pain. Patient states has had normal bowel movements no significant constipation or diarrhea. Patient denies any dysuria, hematuria or significant nocturia. Patient denies any problems walking, swelling in the joints or loss of balance. Patient denies any skin changes, loss of hair or loss of weight. Patient denies any excessive worrying or anxiety or significant depression. Patient denies any problems with insomnia. Patient denies excessive thirst, polyuria, polydipsia. Patient denies any swollen glands, patient denies easy bruising or easy bleeding. Patient denies any recent infections, allergies or URI. Patient "s visual fields have not changed significantly in recent time. PHYSICAL EXAM: BP 93/70   Pulse 68   Temp (!) 95 F (35 C)   Resp 18   Ht '6\' 4"'$  (1.93 m)   BMI 19.97 kg/m  Patient has quadriplegia secondary to C7 spinal cord injury.  Has marked contracture of his extremities.  Well-developed well-nourished patient in NAD. HEENT reveals PERLA, EOMI, discs not visualized.  Oral cavity is clear. No oral mucosal lesions are identified. Neck is clear without evidence of cervical or supraclavicular adenopathy. Lungs are clear to A&P. Cardiac examination is essentially unremarkable with regular rate and rhythm without murmur rub or thrill. Abdomen is benign with no organomegaly or masses noted. Motor sensory and DTR levels are equal and symmetric in the upper and lower extremities. Cranial nerves II through XII are grossly intact. Proprioception is intact. No peripheral adenopathy or edema is  identified. No motor or sensory levels are noted. Crude visual fields are within normal range.  LABORATORY DATA: Pathology reports reviewed    RADIOLOGY RESULTS: MRI scan PSMA PET scan CT scans reviewed compatible with above-stated findings   IMPRESSION: Stage IIc Gleason 7 adenocarcinoma the prostate with a PSA of 107 in 66 year old male with profound quadriplegia  PLAN: At this time this is a difficult case.  I have run the [facility] nomogram showing a 51% of lymph node involvement.  Also is an 89% chance of extracapsular extension.  I believe it would be difficult to get his feet in stirrups for volume study as well as I-125 interstitial implant as well as I believe this will be under treating his overall disease.  I have asked [name] to place fiducial markers and to start ADT therapy.  I would plan on delivering 33 Pearline Cables to his prostate as well as treat his pelvic nodes to 54 Gray using IMRT dose painting technique.  We will better evaluate his ability to lay flat on the simulation table at the time of simulation which will be arranged after markers are placed.  Risks and benefits of treatment occluding increased lower urinary tract symptoms possible diarrhea fatigue alteration blood counts all reviewed in detail.  Do not believe his decubitus ulcer be  affected by radiation since the type of treatment we are proposing very little skin dose will be received in that region.  I would like to take this opportunity to thank you for allowing me to participate in the care of your patient.Noreene Filbert, MD

## 2022-03-27 ENCOUNTER — Telehealth: Payer: Self-pay | Admitting: *Deleted

## 2022-03-27 ENCOUNTER — Ambulatory Visit: Admission: RE | Admit: 2022-03-27 | Payer: Medicare (Managed Care) | Source: Ambulatory Visit

## 2022-03-27 ENCOUNTER — Encounter: Payer: Medicare (Managed Care) | Attending: Physician Assistant | Admitting: Physician Assistant

## 2022-03-27 DIAGNOSIS — I480 Paroxysmal atrial fibrillation: Secondary | ICD-10-CM | POA: Insufficient documentation

## 2022-03-27 DIAGNOSIS — L89153 Pressure ulcer of sacral region, stage 3: Secondary | ICD-10-CM | POA: Insufficient documentation

## 2022-03-27 DIAGNOSIS — G8254 Quadriplegia, C5-C7 incomplete: Secondary | ICD-10-CM | POA: Diagnosis not present

## 2022-03-27 DIAGNOSIS — I9589 Other hypotension: Secondary | ICD-10-CM | POA: Insufficient documentation

## 2022-03-27 NOTE — Progress Notes (Signed)
[name], [name] ([id number]) Visit Report for 03/27/2022 Chief Complaint Document Details Patient Name: [name] Date of Service: 03/27/2022 2:45 PM Medical Record Number: [id number] Patient Account Number: [id number] Date of Birth/Sex: 1955/11/03 (66 y.o. M) Treating RN: Rhae Hammock Primary Care Provider: Leward Quan Other Clinician: Referring Provider: Leward Quan Treating Provider/Extender: Skipper Cliche in Treatment: 14 Information Obtained from: Patient Chief Complaint Sacral pressure ulcer Electronic Signature(s) Signed: 03/27/2022 2:40:43 PM By: Worthy Keeler PA-C Entered By: Worthy Keeler on 03/27/2022 14:40:43 [facility], [place] ([id number]) -------------------------------------------------------------------------------- HPI Details Patient Name: [name] Date of Service: 03/27/2022 2:45 PM Medical Record Number: [id number] Patient Account Number: [id number] Date of Birth/Sex: [birth year] (66 y.o. M) Treating RN: Rhae Hammock Primary Care Provider: Leward Quan Other Clinician: Referring Provider: Leward Quan Treating Provider/Extender: Skipper Cliche in Treatment: 14 History of Present Illness HPI Description: 12-18-2021 upon evaluation today patient presents for initial evaluation here in the clinic concerning an issue he has been having over the sacral area where he has a wound this is a stage III pressure ulcer. It also appears there may still be some moisture component to this based on what I am seeing. I do not see any evidence of infection but at the same time I do believe that the patient is in need of some additional and hopefully better dressings in order to help this heal more appropriately and quickly. Currently just a protective dressing has been used over this sounds like may possibly be a border foam. The patient did have a CT scan which was negative for osteomyelitis he had a previous wound above this in the sacral region but not necessarily  right of this area. 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 He also has atrial fibrillation and hypotension. 01-01-2022 upon evaluation today patient appears to be doing well with regard to his wound actually see some signs of improvement there is still quite a bit of feeling to be done here but nonetheless I think we are on the right track. I do not see any evidence of active infection locally or systemically at this time which is great news. 01-25-2022 upon evaluation today patient's wound really is not significantly better it actually appears about the same. Of note I do believe that part of the issue here is probably the fact that honestly it is hard to keep the dressing in place I think that when the alginate is put on it needs to be cut bigger than what is currently being seen on the dressing today probably by about 3 times that size and placed directly to the wound bed while spreading the butt cheeks bilaterally. This will allow for the dressing to be right intact with the wound bed so that when it does try to closing on itself it will actually fold on the alginate and not actually contact skin to skin. 02-09-2022 upon evaluation today patient appears to be doing about the same in regard to his wound. Again I do not think the dressings being quite put on correctly keeping the alginate in contact with the wound bed this just does not seem to be quite doing the job. I think we may need to switch to Dakin's moistened gauze dressing this is a very precarious location I am not even sure that he is not getting some feces into this dressing and underneath. I think it is probably going to be changed daily and switch again to the Dakin's. 02-23-2022 upon evaluation  today patient unfortunately still continues to have issues with his sacral area. Again this is a very precarious spot but honestly I think is made worse by the fact that he is up sitting the majority of the  day. He typically gets up by 10 he tells me and does not go to bed until around 11 or 12 this is a good solid at least 12 to 14 hours that he is up day in and day out. With that being said I think that this is probably one of the main reasons why this wound is not healing the other being obviously moisture control I do think the surface of the wound looks better this week compared to previous with the Dakin's but at the same time the wound itself does not appear to be any smaller. 03-12-2022 upon evaluation today patient appears to be doing poorly currently in regard to the wound and sacral region. Unfortunately this is not showing signs of improvement and he actually has a small area of deep tissue injury right in the middle right at the point where the bone of the coccyx is pushing onto the wound bed. Again I think that this is likely end up opening up into a much deeper wound that extends down to bone if he is unable to get off of this. So far he has not been able to really do this effectively tells me that he has been "trying" but only succeeding minimally. 03-27-2022 upon evaluation today patient appears to be doing a little better in regard to his wound compared to last time I saw him. At that point he started to have some breakdown of the central portion of the wound the good news is that has improved. 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 Electronic Signature(s) Signed: 03/27/2022 3:35:22 PM By: Worthy Keeler PA-C Entered By: Worthy Keeler on 03/27/2022 15:35:21 [facility], [place] ([id number]) -------------------------------------------------------------------------------- Physical Exam Details Patient Name: [name] Date of Service: 03/27/2022 2:45 PM Medical Record Number: [id number] Patient Account Number: [id number] Date of Birth/Sex: 08-Dec-1955 (66 y.o.  M) Treating RN: Rhae Hammock Primary Care Provider: Leward Quan Other Clinician: Referring Provider: Leward Quan Treating Provider/Extender: Skipper Cliche in Treatment: 25 Constitutional Well-nourished and well-hydrated in no acute distress. Respiratory normal breathing without difficulty. Psychiatric this patient is able to make decisions and demonstrates good insight into disease process. Alert and Oriented x 3. pleasant and cooperative. Notes Upon inspection patient's wound bed actually appears to be clean although I think this is dramatically improved compared to last time we have start to see some breakdown of the central portion of the wound I still feel like that we have not made as much progress as I would like to see as far as the overall size is concerned but he is staying off of this more on the weekends he barely gets out of bed he tells me during the week he is going to [place] a lot more in order to make sure that they are helping him to get off of this as well. Electronic Signature(s) Signed: 03/27/2022 3:35:55 PM By: Worthy Keeler PA-C Entered By: Worthy Keeler on 03/27/2022 15:35:55 [facility], [place] ([id number]) -------------------------------------------------------------------------------- Physician Orders Details Patient Name: [name] Date of Service: 03/27/2022 2:45 PM Medical Record Number: [id number] Patient Account Number: [id number] Date of Birth/Sex: [birth year] (66 y.o. M) Treating  RN: Rhae Hammock Primary Care Provider: Leward Quan Other Clinician: Referring Provider: Leward Quan Treating Provider/Extender: Skipper Cliche in Treatment: 94 Verbal / Phone Orders: No Diagnosis Coding ICD-10 Coding Code Description L89.153 Pressure ulcer of sacral region, stage 3 G82.54 Quadriplegia, C5-C7 incomplete I48.0 Paroxysmal atrial fibrillation I95.89 Other hypotension Follow-up Appointments Wound #1 Midline Sacrum o Return Appointment in  2 weeks. Home Health Wound #1 [place] for wound care. May utilize formulary equivalent dressing for wound treatment orders unless otherwise specified. Home Health Nurse may visit PRN to address patientos wound care needs. o Scheduled days for dressing changes to be completed; exception, patient has scheduled wound care visit that day. o **Please direct any NON-WOUND related issues/requests for orders to patient's Primary Care Physician. **If current dressing causes regression in wound condition, may D/C ordered dressing product/s and apply Normal Saline Moist Dressing daily until next [place] or Other MD appointment. **Notify W[facility] Healing Center of regression in wound condition at [phone]. Bathing/ Shower/ Hygiene o May shower; gently cleanse wound with antibacterial soap, rinse and pat dry prior to dressing wounds o No tub bath. Off-Loading o Roho cushion for wheelchair o Turn and reposition every 2 hours Additional Orders / Instructions o Follow Nutritious Diet and Increase Protein Intake Wound Treatment Wound #1 - Sacrum Wound Laterality: Midline Cleanser: Soap and Water 1 x Per Day/30 Days Discharge Instructions: Gently cleanse wound with antibacterial soap, rinse and pat dry prior to dressing wounds Primary Dressing: Hydrofera Blue Ready Transfer Foam, 4x5 (in/in) (Dispense As Written) 1 x Per Day/30 Days Discharge Instructions: Apply Hydrofera Blue Ready to wound bed as directed Secondary Dressing: (BORDER) Zetuvit Plus SILICONE BORDER Dressing 4x4 (in/in) 1 x Per Day/30 Days Discharge Instructions: Please do not put silicone bordered dressings under wraps. Use non-bordered dressing only. When applying please make sure skin is pulled flat so dressing stays in place. Electronic Signature(s) Signed: 03/27/2022 3:39:18 PM By: Rhae Hammock RN Signed: 03/27/2022 6:10:21 PM By: Worthy Keeler PA-C Entered By: Rhae Hammock on 03/27/2022 15:19:50 [employer], [place] ([id number]) -------------------------------------------------------------------------------- Problem List Details Patient Name: [name] Date of Service: 03/27/2022 2:45 PM Medical Record Number: [id number] Patient Account Number: [id number] Date of Birth/Sex: Apr 13, 1956 (66 y.o. M) Treating RN: Rhae Hammock Primary Care Provider: Leward Quan Other Clinician: Referring Provider: Leward Quan Treating Provider/Extender: Skipper Cliche in Treatment: 14 Active Problems ICD-10 Encounter Code Description Active Date MDM Diagnosis L89.153 Pressure ulcer of sacral region, stage 3 12/18/2021 No Yes G82.54 Quadriplegia, C5-C7 incomplete 12/18/2021 No Yes I48.0 Paroxysmal atrial fibrillation 12/18/2021 No Yes I95.89 Other hypotension 12/18/2021 No Yes Inactive Problems Resolved Problems Electronic Signature(s) Signed: 03/27/2022 2:40:39 PM By: Worthy Keeler PA-C Entered By: Worthy Keeler on 03/27/2022 14:40:39 Ambrosini, B[place] ([id number]) -------------------------------------------------------------------------------- Progress Note Details Patient Name: [name] Date of Service: 03/27/2022 2:45 PM Medical Record Number: [id number] Patient Account Number: [id number] Date of Birth/Sex: 1956/03/29 (66 y.o. M) Treating RN: Rhae Hammock Primary Care Provider: Leward Quan Other Clinician: Referring Provider: Leward Quan Treating Provider/Extender: Skipper Cliche in Treatment: 14 Subjective Chief Complaint Information obtained from Patient Sacral pressure ulcer History of Present Illness (HPI) 12-18-2021 upon evaluation today patient presents for initial evaluation here in the clinic concerning an issue he has been having over the sacral area where he has a wound this is a stage III pressure ulcer. It also appears there may still be some moisture component to this based  on what I am seeing. I  do not see any evidence of infection but at the same time I do believe that the patient is in need of some additional and hopefully better dressings in order to help this heal more appropriately and quickly. Currently just a protective dressing has been used over this sounds like may possibly be a border foam. The patient did have a CT scan which was negative for osteomyelitis he had a previous wound above this in the sacral region but not necessarily right of this area. 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 He also has atrial fibrillation and hypotension. 01-01-2022 upon evaluation today patient appears to be doing well with regard to his wound actually see some signs of improvement there is still quite a bit of feeling to be done here but nonetheless I think we are on the right track. I do not see any evidence of active infection locally or systemically at this time which is great news. 01-25-2022 upon evaluation today patient's wound really is not significantly better it actually appears about the same. Of note I do believe that part of the issue here is probably the fact that honestly it is hard to keep the dressing in place I think that when the alginate is put on it needs to be cut bigger than what is currently being seen on the dressing today probably by about 3 times that size and placed directly to the wound bed while spreading the butt cheeks bilaterally. This will allow for the dressing to be right intact with the wound bed so that when it does try to closing on itself it will actually fold on the alginate and not actually contact skin to skin. 02-09-2022 upon evaluation today patient appears to be doing about the same in regard to his wound. Again I do not think the dressings being quite put on correctly keeping the alginate in contact with the wound bed this just does not seem to be quite doing the job. I think we may need to switch to Dakin's  moistened gauze dressing this is a very precarious location I am not even sure that he is not getting some feces into this dressing and underneath. I think it is probably going to be changed daily and switch again to the Dakin's. 02-23-2022 upon evaluation today patient unfortunately still continues to have issues with his sacral area. Again this is a very precarious spot but honestly I think is made worse by the fact that he is up sitting the majority of the day. He typically gets up by 10 he tells me and does not go to bed until around 11 or 12 this is a good solid at least 12 to 14 hours that he is up day in and day out. With that being said I think that this is probably one of the main reasons why this wound is not healing the other being obviously moisture control I do think the surface of the wound looks better this week compared to previous with the Dakin's but at the same time the wound itself does not appear to be any smaller. 03-12-2022 upon evaluation today patient appears to be doing poorly currently in regard to the wound and sacral region. Unfortunately this is not showing signs of improvement and he actually has a small area of deep tissue injury right in the middle right at the point where the bone of the coccyx is pushing onto the  wound bed. Again I think that this is likely end up opening up into a much deeper wound that extends down to bone if he is unable to get off of this. So far he has not been able to really do this effectively tells me that he has been "trying" but only succeeding minimally. 03-27-2022 upon evaluation today patient appears to be doing a little better in regard to his wound compared to last time I saw him. At that point he started to have some breakdown of the central portion of the wound the good news is that has improved. The bad news is that he is getting ready to start radiation therapy for his prostate which I think inevitably is going to affect the healing of  this wound. Objective Constitutional Well-nourished and well-hydrated in no acute distress. Vitals Time Taken: 2:57 PM, Height: 76 in, Weight: 167 lbs, BMI: 20.3, Temperature: 98.2 F, Pulse: 87 bpm, Respiratory Rate: 17 breaths/min, Blood Pressure: 90/68 mmHg. Respiratory Sadiq, Andrey ([id number]) normal breathing without difficulty. Psychiatric this patient is able to make decisions and demonstrates good insight into disease process. Alert and Oriented x 3. pleasant and cooperative. General Notes: Upon inspection patient's wound bed actually appears to be clean although I think this is dramatically improved compared to last time we have start to see some breakdown of the central portion of the wound I still feel like that we have not made as much progress as I would like to see as far as the overall size is concerned but he is staying off of this more on the weekends he barely gets out of bed he tells me during the week he is going to [place] a lot more in order to make sure that they are helping him to get off of this as well. Integumentary (Hair, Skin) Wound #1 status is Open. Original cause of wound was Pressure Injury. The date acquired was: 10/18/2021. The wound has been in treatment 14 weeks. The wound is located on the Midline Sacrum. The wound measures 5cm length x 3cm width x 0.2cm depth; 11.781cm^2 area and 2.356cm^3 volume. There is Fat Layer (Subcutaneous Tissue) exposed. There is no tunneling noted, however, there is undermining starting at 12:00 and ending at 12:00 with a maximum distance of 0.2cm. There is a medium amount of serous drainage noted. The wound margin is flat and intact. There is large (67-100%) red granulation within the wound bed. There is a small (1-33%) amount of necrotic tissue within the wound bed including Adherent Slough. Assessment Active Problems ICD-10 Pressure ulcer of sacral region, stage 3 Quadriplegia, C5-C7 incomplete Paroxysmal atrial  fibrillation Other hypotension Plan Follow-up Appointments: Wound #1 Midline Sacrum: Return Appointment in 2 weeks. Home Health: Wound #1 Midline Sacrum: [place]: - [place] for wound care. May utilize formulary equivalent dressing for wound treatment orders unless otherwise specified. Home Health Nurse may visit PRN to address patient s wound care needs. Scheduled days for dressing changes to be completed; exception, patient has scheduled wound care visit that day. **Please direct any NON-WOUND related issues/requests for orders to patient's Primary Care Physician. **If current dressing causes regression in wound condition, may D/C ordered dressing product/s and apply Normal Saline Moist Dressing daily until next [place] or Other MD appointment. **Notify [facility]aling Center of regression in wound condition at [phone]. Bathing/ Shower/ Hygiene: May shower; gently cleanse wound with antibacterial soap, rinse and pat dry prior to dressing wounds No tub bath. Off-Loading:  Roho cushion for wheelchair Turn and reposition every 2 hours Additional Orders / Instructions: Follow Nutritious Diet and Increase Protein Intake WOUND #1: - Sacrum Wound Laterality: Midline Cleanser: Soap and Water 1 x Per Day/30 Days Discharge Instructions: Gently cleanse wound with antibacterial soap, rinse and pat dry prior to dressing wounds Primary Dressing: Hydrofera Blue Ready Transfer Foam, 4x5 (in/in) (Dispense As Written) 1 x Per Day/30 Days Discharge Instructions: Apply Hydrofera Blue Ready to wound bed as directed Secondary Dressing: (BORDER) Zetuvit Plus SILICONE BORDER Dressing 4x4 (in/in) 1 x Per Day/30 Days Discharge Instructions: Please do not put silicone bordered dressings under wraps. Use non-bordered dressing only. When applying please make sure skin is pulled flat so dressing stays in place. 1. I am going to have the patient actually go ahead and  continue with the wound care measures as before I do believe going to [facility] is actually still of benefit for him. 2. I am also can recommend that he try to stay off of this is much as possible as far as offloading is concerned. I do believe that he is doing better and I think that shows but still this is going to take quite a while to heal especially in light of the fact he is getting ready to start with prostate radiation therapy. 3. We will be using the [facility] with a bordered foam dressing. [facility], [place] ([id number]) We will see patient back for reevaluation in 2 weeks here in the clinic. If anything worsens or changes patient will contact our office for additional recommendations. Electronic Signature(s) Signed: 03/27/2022 3:36:55 PM By: Worthy Keeler PA-C Entered By: Worthy Keeler on 03/27/2022 15:36:55 Re[place], [place] ([id number]) -------------------------------------------------------------------------------- SuperBill Details Patient Name: [name] Date of Service: 03/27/2022 Medical Record Number: [id number] Patient Account Number: [id number] Date of Birth/Sex: [birth year] (66 y.o. M) Treating RN: Rhae Hammock Primary Care Provider: Leward Quan Other Clinician: Referring Provider: Leward Quan Treating Provider/Extender: Skipper Cliche in Treatment: 14 Diagnosis Coding ICD-10 Codes Code Description [phone] Pressure ulcer of sacral region, stage 3 G82.54 Quadriplegia, C5-C7 incomplete I48.0 Paroxysmal atrial fibrillation I95.89 Other hypotension Facility Procedures CPT4 Code: [id number] Description: 99213 - WOUND CARE VISIT-LEV 3 EST PT Modifier: Quantity: 1 Physician Procedures CPT4 Code: [id number] Description: 32671 - WC PHYS LEVEL 3 - EST PT Modifier: Quantity: 1 CPT4 Code: Description: ICD-10 Diagnosis Description L89.153 Pressure ulcer of sacral region, stage 3 G82.54 Quadriplegia, C5-C7 incomplete I48.0 Paroxysmal atrial fibrillation I95.89 Other  hypotension Modifier: Quantity: Electronic Signature(s) Signed: 03/27/2022 3:37:12 PM By: Worthy Keeler PA-C Entered By: Worthy Keeler on 03/27/2022 15:37:12

## 2022-03-27 NOTE — Telephone Encounter (Signed)
[name]  called in today for [facility] and would like to talk with you about [name] getting the Eligard shot there at [facility] because it is cheaper for him . She would like a call back from you . [phone] He is scheduled for seed placement and injection on 04/06/2022.

## 2022-03-27 NOTE — Progress Notes (Signed)
[name], [name] ([id number]) Visit Report for 03/27/2022 Arrival Information Details Patient Name: [name], [name] Date of Service: 03/27/2022 2:45 PM Medical Record Number: [id number] Patient Account Number: [id number] Date of Birth/Sex: May 13, 1956 (66 y.o. M) Treating RN: [name] Primary Care [name]: [name] Other Clinician: Referring [name]: [name] Treating [name]/Extender: [name] in Treatment: 14 Visit Information History Since Last Visit Added or deleted any medications: No Patient Arrived: Wheel Chair Any new allergies or adverse reactions: No Arrival Time: 14:56 Had a fall or experienced change in No Accompanied By: family activities of daily living that may affect Transfer Assistance: [name] risk of falls: Patient Identification Verified: Yes Signs or symptoms of abuse/neglect since last visito No Secondary Verification Process Completed: Yes Hospitalized since last visit: No Patient Requires Transmission-Based Precautions: No Implantable device outside of the clinic excluding No Patient Has Alerts: Yes cellular tissue based products placed in the center Patient Alerts: NOT DIABETIC since last visit: Has Dressing in Place as Prescribed: Yes Pain Present Now: No Electronic Signature(s) Signed: 03/27/2022 3:39:18 PM By: Michael Hammock RN Entered By: [name] on 03/27/2022 14:57:05 [name], [name] ([id number]) -------------------------------------------------------------------------------- Clinic Level of Care Assessment Details Patient Name: [name] Date of Service: 03/27/2022 2:45 PM Medical Record Number: [id number] Patient Account Number: [id number] Date of Birth/Sex: 1956-03-09 (66 y.o. M) Treating RN: [name] Primary Care [name]: [name] Other Clinician: Referring [name]: [name] Treating [name]/Extender: [name] in Treatment: 14 Clinic Level of Care Assessment Items TOOL 4  Quantity Score X - Use when only an EandM is performed on FOLLOW-UP visit 1 0 ASSESSMENTS - Nursing Assessment / Reassessment X - Reassessment of Co-morbidities (includes updates in patient status) 1 10 X- 1 5 Reassessment of Adherence to Treatment Plan ASSESSMENTS - Wound and Skin Assessment / Reassessment X - Simple Wound Assessment / Reassessment - one wound 1 5 '[]'  - 0 Complex Wound Assessment / Reassessment - multiple wounds '[]'  - 0 Dermatologic / Skin Assessment (not related to wound area) ASSESSMENTS - Focused Assessment '[]'  - Circumferential Edema Measurements - multi extremities 0 '[]'  - 0 Nutritional Assessment / Counseling / Intervention '[]'  - 0 Lower Extremity Assessment (monofilament, tuning fork, pulses) '[]'  - 0 Peripheral Arterial Disease Assessment (using hand held doppler) ASSESSMENTS - Ostomy and/or Continence Assessment and Care '[]'  - Incontinence Assessment and Management 0 '[]'  - 0 Ostomy Care Assessment and Management (repouching, etc.) PROCESS - Coordination of Care X - Simple Patient / Family Education for ongoing care 1 15 '[]'  - 0 Complex (extensive) Patient / Family Education for ongoing care X- 1 10 Staff obtains Programmer, systems, Records, Test Results / Process Orders '[]'  - 0 Staff telephones HHA, Nursing Homes / Clarify orders / etc '[]'  - 0 Routine Transfer to another Facility (non-emergent condition) '[]'  - 0 Routine Hospital Admission (non-emergent condition) '[]'  - 0 New Admissions / Biomedical engineer / Ordering NPWT, Apligraf, etc. '[]'  - 0 Emergency Hospital Admission (emergent condition) X- 1 10 Simple Discharge Coordination '[]'  - 0 Complex (extensive) Discharge Coordination PROCESS - Special Needs '[]'  - Pediatric / Minor Patient Management 0 '[]'  - 0 Isolation Patient Management '[]'  - 0 Hearing / Language / Visual special needs '[]'  - 0 Assessment of Community assistance (transportation, D/C planning, etc.) '[]'  - 0 Additional assistance / Altered  mentation '[]'  - 0 Support Surface(s) Assessment (bed, cushion, seat, etc.) INTERVENTIONS - Wound Cleansing / Measurement [name] ([id number]) X- 1 5 Simple Wound Cleansing - one wound '[]'  - 0 Complex Wound  Cleansing - multiple wounds X- 1 5 Wound Imaging (photographs - any number of wounds) '[]'  - 0 Wound Tracing (instead of photographs) X- 1 5 Simple Wound Measurement - one wound '[]'  - 0 Complex Wound Measurement - multiple wounds INTERVENTIONS - Wound Dressings X - Small Wound Dressing one or multiple wounds 1 10 '[]'  - 0 Medium Wound Dressing one or multiple wounds '[]'  - 0 Large Wound Dressing one or multiple wounds X- 1 5 Application of Medications - topical '[]'  - 0 Application of Medications - injection INTERVENTIONS - Miscellaneous '[]'  - External ear exam 0 '[]'  - 0 Specimen Collection (cultures, biopsies, blood, body fluids, etc.) '[]'  - 0 Specimen(s) / Culture(s) sent or taken to Lab for analysis X- 1 10 Patient Transfer (multiple staff / Civil Service fast streamer / Similar devices) '[]'  - 0 Simple Staple / Suture removal (25 or less) '[]'  - 0 Complex Staple / Suture removal (26 or more) '[]'  - 0 Hypo / Hyperglycemic Management (close monitor of Blood Glucose) '[]'  - 0 Ankle / Brachial Index (ABI) - do not check if billed separately X- 1 5 Vital Signs Has the patient been seen at the hospital within the last three years: Yes Total Score: 100 Level Of Care: New/Established - Level 3 Electronic Signature(s) Signed: 03/27/2022 3:39:18 PM By: Michael Hammock RN Entered By: [name] on 03/27/2022 15:20:18 [name] ([id number]) -------------------------------------------------------------------------------- Encounter Discharge Information Details Patient Name: [name] Date of Service: 03/27/2022 2:45 PM Medical Record Number: [id number] Patient Account Number: [id number] Date of Birth/Sex: 1955/11/26 (66 y.o. M) Treating RN: [name] Primary Care Landree Fernholz:  [name] Other Clinician: Referring [name]: [name] Treating [name]: [name] in Treatment: 14 Encounter Discharge Information Items Discharge Condition: Stable Ambulatory Status: Wheelchair Discharge Destination: Home Transportation: Private Auto Accompanied By: family Schedule Follow-up Appointment: Yes Clinical Summary of Care: Patient Declined Electronic Signature(s) Signed: 03/27/2022 3:39:18 PM By: Michael Hammock RN Entered By: [name] on 03/27/2022 15:20:59 Archey, Arthor ([id number]) -------------------------------------------------------------------------------- Lower Extremity Assessment Details Patient Name: [name] Date of Service: 03/27/2022 2:45 PM Medical Record Number: [id number] Patient Account Number: [id number] Date of Birth/Sex: 05-10-1956 (66 y.o. M) Treating RN: [name] Primary Care Raj Landress: [name] Other Clinician: Referring Shelanda Duvall: [name] Treating Liberti Appleton/Extender: [name] in Treatment: 14 Electronic Signature(s) Signed: 03/27/2022 3:39:18 PM By: Michael Hammock RN Entered By: [name] on 03/27/2022 14:47:48 Dicioccio, Macklen ([id number]) -------------------------------------------------------------------------------- [place] Details Patient Name: [name] Date of Service: 03/27/2022 2:45 PM Medical Record Number: [id number] Patient Account Number: [id number] Date of Birth/Sex: 1956-04-12 (66 y.o. M) Treating RN: [name] Primary Care Dejah Droessler: [name] Other Clinician: Referring Calob Baskette: [name] Treating Katrianna Friesenhahn/Extender: [name] in Treatment: 14 Active Inactive Pressure Nursing Diagnoses: Knowledge deficit related to management of pressures ulcers Goals: Patient will remain free from development of additional pressure ulcers Date Initiated: 12/18/2021 Target Resolution Date: 05/19/2022 Goal Status: Active Patient  will remain free of pressure ulcers Date Initiated: 12/18/2021 Target Resolution Date: 05/19/2022 Goal Status: Active Patient/caregiver will verbalize risk factors for pressure ulcer development Date Initiated: 12/18/2021 Target Resolution Date: 06/16/2022 Goal Status: Active Patient/caregiver will verbalize understanding of pressure ulcer management Date Initiated: 12/18/2021 Target Resolution Date: 05/19/2022 Goal Status: Active Interventions: Assess: immobility, friction, shearing, incontinence upon admission and as needed Assess offloading mechanisms upon admission and as needed Assess potential for pressure ulcer upon admission and as needed Provide education on pressure ulcers Treatment Activities: Patient referred for seating evaluation to ensure proper offloading :  12/18/2021 Notes: Wound/Skin Impairment Nursing Diagnoses: Impaired tissue integrity Knowledge deficit related to smoking impact on wound healing Knowledge deficit related to ulceration/compromised skin integrity Goals: Patient/caregiver will verbalize understanding of skin care regimen Date Initiated: 12/18/2021 Date Inactivated: 01/25/2022 Target Resolution Date: 12/18/2021 Goal Status: Met Ulcer/skin breakdown will have a volume reduction of 30% by week 4 Date Initiated: 12/18/2021 Target Resolution Date: 05/19/2022 Goal Status: Active Interventions: Assess ulceration(s) every visit Treatment Activities: Referred to DME Levelle Edelen for dressing supplies : 12/18/2021 Skin care regimen initiated : 12/18/2021 Topical wound management initiated : 12/18/2021 KEYTON, BHAT ([id number]) Notes: Electronic Signature(s) Signed: 03/27/2022 3:39:18 PM By: Michael Hammock RN Entered By: [name] on 03/27/2022 15:19:29 Machamer, Faust ([id number]) -------------------------------------------------------------------------------- Pain Assessment Details Patient Name: [name] Date of Service: 03/27/2022 2:45 PM Medical  Record Number: [id number] Patient Account Number: [id number] Date of Birth/Sex: 1956/02/20 (66 y.o. M) Treating RN: [name] Primary Care Airen Dales: [name] Other Clinician: Referring Elbert Spickler: [name] Treating Anael Rosch/Extender: [name] in Treatment: 14 Active Problems Location of Pain Severity and Description of Pain Patient Has Paino No Site Locations Rate the pain. Current Pain Level: 0 Pain Management and Medication Current Pain Management: Electronic Signature(s) Signed: 03/27/2022 3:39:18 PM By: Michael Hammock RN Entered By: [name] on 03/27/2022 14:58:25 Husk, Timithy ([id number]) -------------------------------------------------------------------------------- Patient/Caregiver Education Details Patient Name: [name] Date of Service: 03/27/2022 2:45 PM Medical Record Number: [id number] Patient Account Number: [id number] Date of Birth/Gender: 06-04-1956 (66 y.o. M) Treating RN: [name] Primary Care Physician: [name] Other Clinician: Referring Physician: Leward [name] Treating Physician/Extender: [name] in Treatment: 14 Education Assessment Education Provided To: Patient Education Topics Provided Pressure: Methods: Explain/Verbal Responses: Reinforcements needed, State content correctly Electronic Signature(s) Signed: 03/27/2022 3:39:18 PM By: Michael Hammock RN Entered By: [name] on 03/27/2022 15:20:33 Sahagun, Arvo ([id number]) -------------------------------------------------------------------------------- Wound Assessment Details Patient Name: [name] Date of Service: 03/27/2022 2:45 PM Medical Record Number: [id number] Patient Account Number: [id number] Date of Birth/Sex: 1955-07-15 (66 y.o. M) Treating RN: [name] Primary Care Dorathy Stallone: [name] Other Clinician: Referring Senaida Chilcote: [name] Treating Brihany Butch/Extender: [name] in Treatment: 14 Wound  Status Wound Number: 1 Primary Pressure Ulcer Etiology: Wound Location: Midline Sacrum Wound Status: Open Wounding Event: Pressure Injury Comorbid Arrhythmia, Hypotension, History of pressure wounds, Date Acquired: 10/18/2021 History: Quadriplegia Weeks Of Treatment: 14 Clustered Wound: No Photos Wound Measurements Length: (cm) 5 % Reduct Width: (cm) 3 % Reduct Depth: (cm) 0.2 Epitheli Area: (cm) 11.781 Tunneli Volume: (cm) 2.356 Undermi Start Endin Maxim ion in Area: -410.2% ion in Volume: -410% alization: Small (1-33%) ng: No ning: Yes ing Position (o'clock): 12 g Position (o'clock): 12 um Distance: (cm) 0.2 Wound Description Classification: Category/Stage III Foul Od Wound Margin: Flat and Intact Slough/ Exudate Amount: Medium Exudate Type: Serous Exudate Color: amber or After Cleansing: No Fibrino Yes Wound Bed Granulation Amount: Large (67-100%) Exposed Structure Granulation Quality: Red Fascia Exposed: No Necrotic Amount: Small (1-33%) Fat Layer (Subcutaneous Tissue) Exposed: Yes Necrotic Quality: Adherent Slough Tendon Exposed: No Muscle Exposed: No Joint Exposed: No Bone Exposed: No Treatment Notes Wound #1 (Sacrum) Wound Laterality: Midline Cleanser Borjon, Chamberlain ([id number]) Soap and Water Discharge Instruction: Gently cleanse wound with antibacterial soap, rinse and pat dry prior to dressing wounds Peri-Wound Care Topical Primary Dressing Hydrofera Blue Ready Transfer Foam, 4x5 (in/in) Discharge Instruction: Apply Hydrofera Blue Ready to wound bed as directed Secondary Dressing (BORDER) Zetuvit Plus SILICONE BORDER Dressing 4x4 (in/in) Discharge Instruction: Please do not put silicone  bordered dressings under wraps. Use non-bordered dressing only. When applying please make sure skin is pulled flat so dressing stays in place. Secured With Compression Wrap Compression Stockings Environmental education officer) Unsigned Previous Signature:  03/27/2022 3:39:18 PM Version By: Michael Hammock RN Entered By: [name] on 03/27/2022 15:43:54 Signature(s): [facility], [name] ([id number]) Date(s): -------------------------------------------------------------------------------- Vitals Details Patient Name: [name] Date of Service: 03/27/2022 2:45 PM Medical Record Number: [id number] Patient Account Number: [id number] Date of Birth/Sex: 1956/06/15 (66 y.o. M) Treating RN: [name] Primary Care Janiel Crisostomo: [name] Other Clinician: Referring Raschelle Wisenbaker: [name] Treating Wylie Russon/Extender: [name] in Treatment: 14 Vital Signs Time Taken: 14:57 Temperature (F): 98.2 Height (in): 76 Pulse (bpm): 87 Weight (lbs): 167 Respiratory Rate (breaths/min): 17 Body Mass Index (BMI): 20.3 Blood Pressure (mmHg): 90/68 Reference Range: 80 - 120 mg / dl Electronic Signature(s) Signed: 03/27/2022 3:39:18 PM By: Michael Hammock RN Entered By: [name] on 03/27/2022 14:58:15

## 2022-03-29 ENCOUNTER — Ambulatory Visit: Payer: Medicare (Managed Care) | Admitting: Physician Assistant

## 2022-03-29 ENCOUNTER — Ambulatory Visit: Payer: Medicare (Managed Care)

## 2022-03-30 NOTE — Telephone Encounter (Signed)
[name] with [facility] [phone]) called the office today and left a voice mail message confirming that the [facility] clinic WILL administer Eligard for this patient.

## 2022-04-06 ENCOUNTER — Ambulatory Visit (INDEPENDENT_AMBULATORY_CARE_PROVIDER_SITE_OTHER): Payer: Medicare (Managed Care) | Admitting: Urology

## 2022-04-06 ENCOUNTER — Encounter: Payer: Self-pay | Admitting: Urology

## 2022-04-06 VITALS — BP 120/70 | HR 78 | Ht 76.0 in | Wt 164.0 lb

## 2022-04-06 DIAGNOSIS — C61 Malignant neoplasm of prostate: Secondary | ICD-10-CM | POA: Diagnosis not present

## 2022-04-06 MED ORDER — LEVOFLOXACIN 500 MG PO TABS
500.0000 mg | ORAL_TABLET | Freq: Once | ORAL | Status: AC
  Administered 2022-04-06: 500 mg via ORAL

## 2022-04-06 MED ORDER — GENTAMICIN SULFATE 40 MG/ML IJ SOLN
80.0000 mg | Freq: Once | INTRAMUSCULAR | Status: AC
  Administered 2022-04-06: 80 mg via INTRAMUSCULAR

## 2022-04-06 NOTE — Patient Instructions (Signed)
The most common side effects of Eligard is fatigue and night sweats as this medication drops the tesosterone level.   Continue Vitamin D 800-1000iu and Calcium 1000-'1200mg'$  daily while on Androgen Deprivation Therapy.

## 2022-04-06 NOTE — Progress Notes (Unsigned)
04/06/22  CC: gold fiducial marker placement  HPI: 66 y.o. male with prostate cancer who presents today for placement of fiducial seed markers in anticipation of his upcoming IMRT with Dr. Baruch Gouty.  Prostate Gold fiducial Marker Placement Procedure   Informed consent was obtained after discussing risks/benefits of the procedure.  A time out was performed to ensure correct patient identity.  Pre-Procedure: - Gentamicin given prophylactically - PO Levaquin 500 mg also given today  Procedure: - Rectal ultrasound probe was placed without difficulty and the prostate visualized - 3 fiducial gold seed markers placed, one at right base, one at left base, one at apex of prostate gland under transrectal ultrasound guidance  Post-Procedure: - Patient tolerated the procedure well - He was counseled to seek immediate medical attention if experiences any severe pain, significant bleeding, or fevers  -ADT recommended by radiation oncology.  He will receive his leuprolide injection at [facility].  We discussed the most common side effects of hot flashes, tiredness and fatigue    John Giovanni, MD

## 2022-04-09 ENCOUNTER — Ambulatory Visit: Payer: Medicare (Managed Care)

## 2022-04-09 ENCOUNTER — Encounter: Payer: Medicare (Managed Care) | Admitting: Physician Assistant

## 2022-04-09 DIAGNOSIS — L89153 Pressure ulcer of sacral region, stage 3: Secondary | ICD-10-CM | POA: Diagnosis not present

## 2022-04-09 NOTE — Progress Notes (Signed)
[place], [name] ([id number]) [id number] Page 1 of 7 Visit Report for 04/09/2022 Chief Complaint Document Details Patient Name: Date of Service: [name] 04/09/2022 11:30 A M Medical Record Number: [id number] Patient Account Number: [id number] Date of Birth/Sex: Treating RN: 12-Dec-1955 (66 y.o. [name] Primary Care Provider: Leward Quan Other Clinician: Referring Provider: Treating Provider/Extender: Durwin Glaze in Treatment: 16 Information Obtained from: Patient Chief Complaint Sacral pressure ulcer Electronic Signature(s) Signed: 04/09/2022 11:11:22 AM By: Worthy Keeler PA-C Entered By: Worthy Keeler on 04/09/2022 11:11:22 -------------------------------------------------------------------------------- HPI Details Patient Name: Date of Service: Michael [place], DO UGLA S 04/09/2022 11:30 A M Medical Record Number: [id number] Patient Account Number: [id number] Date of Birth/Sex: Treating RN: 02/04/1956 (66 y.o. [name] Primary Care Provider: Leward Quan Other Clinician: Referring Provider: Treating Provider/Extender: Durwin Glaze in Treatment: 16 History of Present Illness HPI Description: 12-18-2021 upon evaluation today patient presents for initial evaluation here in the clinic concerning an issue he has been having over the sacral area where he has a wound this is a stage III pressure ulcer. It also appears there may still be some moisture component to this based on what I am seeing. I do not see any evidence of infection but at the same time I do believe that the patient is in need of some additional and hopefully better dressings in order to help this heal more appropriately and quickly. Currently just a protective dressing has been used over this sounds like may possibly be a border foam. The patient did have a CT scan which was negative for osteomyelitis he had a previous wound above this in the sacral  region but not necessarily right of this area. 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 He also has atrial fibrillation and hypotension. 01-01-2022 upon evaluation today patient appears to be doing well with regard to his wound actually see some signs of improvement there is still quite a bit of feeling to be done here but nonetheless I think we are on the right track. I do not see any evidence of active infection locally or systemically at this time which is great news. 01-25-2022 upon evaluation today patient's wound really is not significantly better it actually appears about the same. Of note I do believe that part of the issue here is probably the fact that honestly it is hard to keep the dressing in place I think that when the alginate is put on it needs to be cut bigger than what is currently being seen on the dressing today probably by about 3 times that size and placed directly to the wound bed while spreading the butt cheeks bilaterally. This will allow for the dressing to be right intact with the wound bed so that when it does try to closing on itself it will actually fold on the alginate and not [facility], [name] ([id number]) [id number] Page 2 of 7 actually contact skin to skin. 02-09-2022 upon evaluation today patient appears to be doing about the same in regard to his wound. Again I do not think the dressings being quite put on correctly keeping the alginate in contact with the wound bed this just does not seem to be quite doing the job. I think we may need to switch to Dakin's moistened gauze dressing this is a very precarious location I am not even sure that he is not getting some feces into this dressing and underneath. I  think it is probably going to be changed daily and switch again to the Dakin's. 02-23-2022 upon evaluation today patient unfortunately still continues to have issues with his sacral area. Again this is a very  precarious spot but honestly I think is made worse by the fact that he is up sitting the majority of the day. He typically gets up by 10 he tells me and does not go to bed until around 11 or 12 this is a good solid at least 12 to 14 hours that he is up day in and day out. With that being said I think that this is probably one of the main reasons why this wound is not healing the other being obviously moisture control I do think the surface of the wound looks better this week compared to previous with the Dakin's but at the same time the wound itself does not appear to be any smaller. 03-12-2022 upon evaluation today patient appears to be doing poorly currently in regard to the wound and sacral region. Unfortunately this is not showing signs of improvement and he actually has a small area of deep tissue injury right in the middle right at the point where the bone of the coccyx is pushing onto the wound bed. Again I think that this is likely end up opening up into a much deeper wound that extends down to bone if he is unable to get off of this. So far he has not been able to really do this effectively tells me that he has been "trying" but only succeeding minimally. 03-27-2022 upon evaluation today patient appears to be doing a little better in regard to his wound compared to last time I saw him. At that point he started to have some breakdown of the central portion of the wound the good news is that has improved. 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 04-09-2022 upon evaluation today patient's wound is actually showing signs of doing okay. Its not significantly smaller and is also not any larger nor showing signs of breaking down. He tells me he is still trying to stay off of this on the weekends. He also tells me during the week he is going to the pace program more frequently 3 times a  week at this point. With that being said overall I am pleased in that regard and I am happy that he is doing somewhat better. With that being said I explained to the patient as well that I do believe he still needs to be offloading is much as he can. Electronic Signature(s) Signed: 04/09/2022 2:47:06 PM By: Worthy Keeler PA-C Entered By: Worthy Keeler on 04/09/2022 14:47:06 -------------------------------------------------------------------------------- Physical Exam Details Patient Name: Date of Service: Michael [place], DO [name] 04/09/2022 11:30 A M Medical Record Number: [id number] Patient Account Number: [id number] Date of Birth/Sex: Treating RN: [birth year] (66 y.o. [name] Primary Care Provider: Leward Quan Other Clinician: Referring Provider: Treating Provider/Extender: Durwin Glaze in Treatment: 82 Constitutional Well-nourished and well-hydrated in no acute distress. Respiratory normal breathing without difficulty. Psychiatric this patient is able to make decisions and demonstrates good insight into disease process. Alert and Oriented x 3. pleasant and cooperative. Notes Upon inspection patient's wound bed actually appears to be healthy from a granulation standpoint although there is definitely some subcutaneous tissue noted as well this is not just  a full granular bed. Nonetheless is not showing signs of want to breakdown currently although of note the patient is going to likely be starting radiation therapy in the next 2 weeks and at that point the radiation oncologist has stated that they are going to try to avoid the wound area with radiation but again I think this is doing a tough spot in which to do that. Regular see how things go. Electronic Signature(s) Signed: 04/09/2022 3:03:23 PM By: Worthy Keeler PA-C Previous Signature: 04/09/2022 3:03:05 PM Version By: Worthy Keeler PA-C Entered By: Worthy Keeler on 04/09/2022 15:03:23 Garcia, Nikolis  ([id number]) [id number] Page 3 of 7 -------------------------------------------------------------------------------- Physician Orders Details Patient Name: Date of Service: [name] 04/09/2022 11:30 A M Medical Record Number: [id number] Patient Account Number: [id number] Date of Birth/Sex: Treating RN: September 25, 1955 (66 y.o. [name] Primary Care Provider: Leward Quan Other Clinician: Referring Provider: Treating Provider/Extender: Durwin Glaze in Treatment: [phone] Verbal / Phone Orders: No Diagnosis Coding ICD-10 Coding Code Description L89.153 Pressure ulcer of sacral region, stage 3 G82.54 Quadriplegia, C5-C7 incomplete I48.0 Paroxysmal atrial fibrillation I95.89 Other hypotension Follow-up Appointments Wound #1 Midline Sacrum Return Appointment in 2 weeks. Home Health Wound #1 [place]: - [place] for wound care. May utilize formulary equivalent dressing for wound treatment orders unless otherwise specified. Home Health Nurse may visit PRN to address patients wound care needs. Scheduled days for dressing changes to be completed; exception, patient has scheduled wound care visit that day. **Please direct any NON-WOUND related issues/requests for orders to patient's Primary Care Physician. **If current dressing causes regression in wound condition, may D/C ordered dressing product/s and apply Normal Saline Moist Dressing daily until next [place] or Other MD appointment. **Notify W[facility]aling Center of regression in wound condition at [phone]. Bathing/ Shower/ Hygiene May shower; gently cleanse wound with antibacterial soap, rinse and pat dry prior to dressing wounds No tub bath. Off-Loading Roho cushion for wheelchair Turn and reposition every 2 hours Additional Orders / Instructions Follow Nutritious Diet and Increase Protein Intake Wound Treatment Wound #1 -  Sacrum Wound Laterality: Midline Cleanser: Soap and Water 1 x Per Day/30 Days Discharge Instructions: Gently cleanse wound with antibacterial soap, rinse and pat dry prior to dressing wounds Prim Dressing: Hydrofera Blue Ready Transfer Foam, 4x5 (in/in) (Dispense As Written) 1 x Per Day/30 Days ary Discharge Instructions: Apply Hydrofera Blue Ready to wound bed as directed Secondary Dressing: (BORDER) Zetuvit Plus SILICONE BORDER Dressing 4x4 (in/in) 1 x Per Day/30 Days Discharge Instructions: Please do not put silicone bordered dressings under wraps. Use non-bordered dressing only. When applying please make sure skin is pulled flat so dressing stays in place. Electronic Signature(s) Signed: 04/09/2022 4:52:26 PM By: Rosalio Loud MSN RN CNS WTA Signed: 04/10/2022 6:28:37 PM By: Worthy Keeler PA-C Entered By: Rosalio Loud on 04/09/2022 12:18:07 [place], [name] ([id number]) [id number] Page 4 of 7 -------------------------------------------------------------------------------- Problem List Details Patient Name: Date of Service: [name] 04/09/2022 11:30 A M Medical Record Number: [id number] Patient Account Number: [id number] Date of Birth/Sex: Treating RN: [birth year] (66 y.o. [name] Primary Care Provider: Leward Quan Other Clinician: Referring Provider: Treating Provider/Extender: Durwin Glaze in Treatment: 16 Active Problems ICD-10 Encounter Code Description Active Date MDM Diagnosis L89.153 Pressure ulcer of sacral region, stage 3 12/18/2021 No Yes G82.54 Quadriplegia, C5-C7 incomplete 12/18/2021 No Yes I48.0 Paroxysmal atrial fibrillation 12/18/2021 No  Yes I95.89 Other hypotension 12/18/2021 No Yes Inactive Problems Resolved Problems Electronic Signature(s) Signed: 04/09/2022 11:11:18 AM By: Worthy Keeler PA-C Entered By: Worthy Keeler on 04/09/2022  11:11:18 -------------------------------------------------------------------------------- Progress Note Details Patient Name: Date of Service: Michael [place], DO UGLA S 04/09/2022 11:30 A M Medical Record Number: [id number] Patient Account Number: [id number] Date of Birth/Sex: Treating RN: [birth year] (66 y.o. [name] Primary Care Provider: Leward Quan Other Clinician: Referring Provider: Treating Provider/Extender: Durwin Glaze in Treatment: [address], [place] ([id number]) [id number] Page 5 of 7 Subjective Chief Complaint Information obtained from Patient Sacral pressure ulcer History of Present Illness (HPI) 12-18-2021 upon evaluation today patient presents for initial evaluation here in the clinic concerning an issue he has been having over the sacral area where he has a wound this is a stage III pressure ulcer. It also appears there may still be some moisture component to this based on what I am seeing. I do not see any evidence of infection but at the same time I do believe that the patient is in need of some additional and hopefully better dressings in order to help this heal more appropriately and quickly. Currently just a protective dressing has been used over this sounds like may possibly be a border foam. The patient did have a CT scan which was negative for osteomyelitis he had a previous wound above this in the sacral region but not necessarily right of this area. 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 He also has atrial fibrillation and hypotension. 01-01-2022 upon evaluation today patient appears to be doing well with regard to his wound actually see some signs of improvement there is still quite a bit of feeling to be done here but nonetheless I think we are on the right track. I do not see any evidence of active infection locally or systemically at this time which is great news. 01-25-2022 upon  evaluation today patient's wound really is not significantly better it actually appears about the same. Of note I do believe that part of the issue here is probably the fact that honestly it is hard to keep the dressing in place I think that when the alginate is put on it needs to be cut bigger than what is currently being seen on the dressing today probably by about 3 times that size and placed directly to the wound bed while spreading the butt cheeks bilaterally. This will allow for the dressing to be right intact with the wound bed so that when it does try to closing on itself it will actually fold on the alginate and not actually contact skin to skin. 02-09-2022 upon evaluation today patient appears to be doing about the same in regard to his wound. Again I do not think the dressings being quite put on correctly keeping the alginate in contact with the wound bed this just does not seem to be quite doing the job. I think we may need to switch to Dakin's moistened gauze dressing this is a very precarious location I am not even sure that he is not getting some feces into this dressing and underneath. I think it is probably going to be changed daily and switch again to the Dakin's. 02-23-2022 upon evaluation today patient unfortunately still continues to have issues with his sacral area. Again this is a very precarious spot but honestly I think is made worse by the fact that he is up sitting the majority of  the day. He typically gets up by 10 he tells me and does not go to bed until around 11 or 12 this is a good solid at least 12 to 14 hours that he is up day in and day out. With that being said I think that this is probably one of the main reasons why this wound is not healing the other being obviously moisture control I do think the surface of the wound looks better this week compared to previous with the Dakin's but at the same time the wound itself does not appear to be any smaller. 03-12-2022 upon  evaluation today patient appears to be doing poorly currently in regard to the wound and sacral region. Unfortunately this is not showing signs of improvement and he actually has a small area of deep tissue injury right in the middle right at the point where the bone of the coccyx is pushing onto the wound bed. Again I think that this is likely end up opening up into a much deeper wound that extends down to bone if he is unable to get off of this. So far he has not been able to really do this effectively tells me that he has been "trying" but only succeeding minimally. 03-27-2022 upon evaluation today patient appears to be doing a little better in regard to his wound compared to last time I saw him. At that point he started to have some breakdown of the central portion of the wound the good news is that has improved. 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 04-09-2022 upon evaluation today patient's wound is actually showing signs of doing okay. Its not significantly smaller and is also not any larger nor showing signs of breaking down. He tells me he is still trying to stay off of this on the weekends. He also tells me during the week he is going to the pace program more frequently 3 times a week at this point. With that being said overall I am pleased in that regard and I am happy that he is doing somewhat better. With that being said I explained to the patient as well that I do believe he still needs to be offloading is much as he can. Objective Constitutional Well-nourished and well-hydrated in no acute distress. Vitals Time Taken: 12:10 PM, Height: 76 in, Weight: 167 lbs, BMI: 20.3, Temperature: 98.1 F, Pulse: 87 bpm, Respiratory Rate: 16 breaths/min, Blood Pressure: 75/45 mmHg. Respiratory normal breathing without difficulty. Psychiatric this patient is able to make decisions  and demonstrates good insight into disease process. Alert and Oriented x 3. pleasant and cooperative. General Notes: Upon inspection patient's wound bed actually appears to be healthy from a granulation standpoint although there is definitely some subcutaneous tissue noted as well this is not just a full granular bed. Nonetheless is not showing signs of want to breakdown currently although of note the patient is going to likely be starting radiation therapy in the next 2 weeks and at that point the radiation oncologist has stated that they are going to try to avoid the wound area with radiation but again I think this is doing a tough spot in which to do that. Regular see how things go. Integumentary (Hair, Skin) Wound #1 status is Open. Original cause of wound was Pressure Injury. The date acquired was: 10/18/2021. The wound has been in treatment 16  weeks. The wound is located on the Midline Sacrum. The wound measures 5cm length x 3cm width x 0.2cm depth; 11.781cm^2 area and 2.356cm^3 volume. There is Fat Layer (Subcutaneous Tissue) exposed. There is no tunneling or undermining noted. There is a medium amount of serous drainage noted. The wound margin is epibole. There is large (67-100%) red granulation within the wound bed. There is a small (1-33%) amount of necrotic tissue within the wound bed including Adherent Slough. [place], [name] ([id number]) [id number] Page 6 of 7 Assessment Active Problems ICD-10 Pressure ulcer of sacral region, stage 3 Quadriplegia, C5-C7 incomplete Paroxysmal atrial fibrillation Other hypotension Plan Follow-up Appointments: Wound #1 Midline Sacrum: Return Appointment in 2 weeks. Home Health: Wound #1 Midline Sacrum: [place]: - [place] for wound care. May utilize formulary equivalent dressing for wound treatment orders unless otherwise specified. Home Health Nurse may visit PRN to address patientoos wound  care needs. Scheduled days for dressing changes to be completed; exception, patient has scheduled wound care visit that day. **Please direct any NON-WOUND related issues/requests for orders to patient's Primary Care Physician. **If current dressing causes regression in wound condition, may D/C ordered dressing product/s and apply Normal Saline Moist Dressing daily until next [place] or Other MD appointment. **Notify Wound Healing Center of regression in wound condition at [phone]. Bathing/ Shower/ Hygiene: May shower; gently cleanse wound with antibacterial soap, rinse and pat dry prior to dressing wounds No tub bath. Off-Loading: Roho cushion for wheelchair Turn and reposition every 2 hours Additional Orders / Instructions: Follow Nutritious Diet and Increase Protein Intake WOUND #1: - Sacrum Wound Laterality: Midline Cleanser: Soap and Water 1 x Per Day/30 Days Discharge Instructions: Gently cleanse wound with antibacterial soap, rinse and pat dry prior to dressing wounds Prim Dressing: Hydrofera Blue Ready Transfer Foam, 4x5 (in/in) (Dispense As Written) 1 x Per Day/30 Days ary Discharge Instructions: Apply Hydrofera Blue Ready to wound bed as directed Secondary Dressing: (BORDER) Zetuvit Plus SILICONE BORDER Dressing 4x4 (in/in) 1 x Per Day/30 Days Discharge Instructions: Please do not put silicone bordered dressings under wraps. Use non-bordered dressing only. When applying please make sure skin is pulled flat so dressing stays in place. 1. I am going to recommend that we have the patient continue to monitor for any signs of infection though for now working to stick with the [facility] which I think is still a good option here. 2. Recommend continue with the bordered foam dressing to cover. 3. I am also can recommend that we have the patient continue to monitor for any signs of worsening or infection. Obviously if anything changes he should let me know appropriate  offloading aggressively is still of utmost concern. We will see patient back for reevaluation in 2 weeks here in the clinic. If anything worsens or changes patient will contact our office for additional recommendations. Electronic Signature(s) Signed: 04/09/2022 3:05:14 PM By: Worthy Keeler PA-C Entered By: Worthy Keeler on 04/09/2022 15:05:14 -------------------------------------------------------------------------------- SuperBill Details Patient Name: Date of Service: Michael [place], DO UGLA S 04/09/2022 Medical Record Number: [id number] Patient Account Number: [id number] Date of Birth/Sex: Treating RN: [birth year] (66 y.o. [name] Primary Care Provider: Leward Quan Other Clinician: [name] ([id number]) [id number] Page 7 of 7 Referring Provider: Treating Provider/Extender: Durwin Glaze in Treatment: 16 Diagnosis Coding ICD-10 Codes Code Description L89.153 Pressure ulcer of sacral region, stage 3 G82.54 Quadriplegia, C5-C7 incomplete I48.0 Paroxysmal atrial fibrillation I95.89 Other hypotension Facility Procedures :  CPT4 Code: [id number] Description: 06004 - WOUND CARE VISIT-LEV 3 EST PT Modifier: Quantity: 1 Physician Procedures : CPT4 Code Description Modifier [id number] - WC PHYS LEVEL 3 - EST PT ICD-10 Diagnosis Description L89.153 Pressure ulcer of sacral region, stage 3 G82.54 Quadriplegia, C5-C7 incomplete I48.0 Paroxysmal atrial fibrillation I95.89 Other hypotension Quantity: 1 Electronic Signature(s) Signed: 04/09/2022 3:06:14 PM By: Worthy Keeler PA-C Entered By: Worthy Keeler on 04/09/2022 15:06:14

## 2022-04-09 NOTE — Progress Notes (Addendum)
[place], [name] ([id number]) [id number] Page 1 of 9 Visit Report for 04/09/2022 Arrival Information Details Patient Name: Date of Service: [name] 04/09/2022 11:30 A M Medical Record Number: [id number] Patient Account Number: [id number] Date of Birth/Sex: Treating RN: 1956-03-14 (66 y.o. [name] Primary Care Brandon Wiechman: Leward Quan Other Clinician: Referring Jaston Havens: Treating Brynnlie Unterreiner/Extender: Durwin Glaze in Treatment: 16 Visit Information History Since Last Visit Has Dressing in Place as Prescribed: Yes Patient Arrived: Wheel Chair Pain Present Now: Yes Arrival Time: 12:10 Accompanied By: sister Transfer Assistance: Civil Service fast streamer Patient Identification Verified: Yes Secondary Verification Process Completed: Yes Patient Requires Transmission-Based Precautions: No Patient Has Alerts: Yes Patient Alerts: NOT DIABETIC Electronic Signature(s) Signed: 04/09/2022 4:52:26 PM By: Rosalio Loud MSN RN CNS WTA Entered By: Rosalio Loud on 04/09/2022 12:10:45 -------------------------------------------------------------------------------- Clinic Level of Care Assessment Details Patient Name: Date of Service: ENO [facility], DO [name] 04/09/2022 11:30 A M Medical Record Number: [id number] Patient Account Number: [id number] Date of Birth/Sex: Treating RN: 1955-08-13 (66 y.o. [name] Primary Care Noemi Ishmael: Leward Quan Other Clinician: Referring Theresa Dohrman: Treating Jailin Moomaw/Extender: Durwin Glaze in Treatment: 16 Clinic Level of Care Assessment Items TOOL 4 Quantity Score '[]'  - 0 Use when only an EandM is performed on FOLLOW-UP visit ASSESSMENTS - Nursing Assessment / Reassessment X- 1 10 Reassessment of Co-morbidities (includes updates in patient status) X- 1 5 Reassessment of Adherence to Treatment Plan ASSESSMENTS - Wound and Skin A ssessment / Reassessment X - Simple Wound Assessment / Reassessment - one wound 1  [place], Thurmon ([id number]) [id number] Page 2 of 9 '[]'  - 0 Complex Wound Assessment / Reassessment - multiple wounds '[]'  - 0 Dermatologic / Skin Assessment (not related to wound area) ASSESSMENTS - Focused Assessment '[]'  - 0 Circumferential Edema Measurements - multi extremities '[]'  - 0 Nutritional Assessment / Counseling / Intervention '[]'  - 0 Lower Extremity Assessment (monofilament, tuning fork, pulses) '[]'  - 0 Peripheral Arterial Disease Assessment (using hand held doppler) ASSESSMENTS - Ostomy and/or Continence Assessment and Care '[]'  - 0 Incontinence Assessment and Management '[]'  - 0 Ostomy Care Assessment and Management (repouching, etc.) PROCESS - Coordination of Care X - Simple Patient / Family Education for ongoing care 1 15 '[]'  - 0 Complex (extensive) Patient / Family Education for ongoing care X- 1 10 Staff obtains Programmer, systems, Records, T Results / Process Orders est '[]'  - 0 Staff telephones HHA, Nursing Homes / Clarify orders / etc '[]'  - 0 Routine Transfer to another Facility (non-emergent condition) '[]'  - 0 Routine Hospital Admission (non-emergent condition) '[]'  - 0 New Admissions / Biomedical engineer / Ordering NPWT Apligraf, etc. , '[]'  - 0 Emergency Hospital Admission (emergent condition) X- 1 10 Simple Discharge Coordination '[]'  - 0 Complex (extensive) Discharge Coordination PROCESS - Special Needs '[]'  - 0 Pediatric / Minor Patient Management '[]'  - 0 Isolation Patient Management '[]'  - 0 Hearing / Language / Visual special needs '[]'  - 0 Assessment of Community assistance (transportation, D/C planning, etc.) '[]'  - 0 Additional assistance / Altered mentation '[]'  - 0 Support Surface(s) Assessment (bed, cushion, seat, etc.) INTERVENTIONS - Wound Cleansing / Measurement X - Simple Wound Cleansing - one wound 1 5 '[]'  - 0 Complex Wound Cleansing - multiple wounds X- 1 5 Wound Imaging (photographs - any number of wounds) '[]'  - 0 Wound  Tracing (instead of photographs) X- 1 5 Simple Wound Measurement - one wound '[]'  - 0 Complex Wound Measurement - multiple wounds INTERVENTIONS - Wound  Dressings '[]'  - 0 Small Wound Dressing one or multiple wounds X- 1 15 Medium Wound Dressing one or multiple wounds '[]'  - 0 Large Wound Dressing one or multiple wounds '[]'  - 0 Application of Medications - topical '[]'  - 0 Application of Medications - injection INTERVENTIONS - Miscellaneous '[]'  - 0 External ear exam '[]'  - 0 Specimen Collection (cultures, biopsies, blood, body fluids, etc.) '[]'  - 0 Specimen(s) / Culture(s) sent or taken to Lab for analysis [facility], [name] ([id number]) [phone].pdf Page 3 of 9 X- 1 10 Patient Transfer (multiple staff / Civil Service fast streamer / Similar devices) '[]'  - 0 Simple Staple / Suture removal (25 or less) '[]'  - 0 Complex Staple / Suture removal (26 or more) '[]'  - 0 Hypo / Hyperglycemic Management (close monitor of Blood Glucose) '[]'  - 0 Ankle / Brachial Index (ABI) - do not check if billed separately X- 1 5 Vital Signs Has the patient been seen at the hospital within the last three years: Yes Total Score: 100 Level Of Care: New/Established - Level 3 Electronic Signature(s) Signed: 04/09/2022 4:52:26 PM By: Rosalio Loud MSN RN CNS WTA Entered By: Rosalio Loud on 04/09/2022 12:18:52 -------------------------------------------------------------------------------- Encounter Discharge Information Details Patient Name: Date of Service: ENO [place], DO UGLA S 04/09/2022 11:30 A M Medical Record Number: [id number] Patient Account Number: [id number] Date of Birth/Sex: Treating RN: 06-May-1956 (66 y.o. [name] Primary Care Anali Cabanilla: Leward Quan Other Clinician: Referring Shemar Plemmons: Treating Aurore Redinger/Extender: Durwin Glaze in Treatment: 16 Encounter Discharge Information Items Discharge Condition: Stable Ambulatory Status: Walker Discharge Destination: Home Transportation:  Private Auto Accompanied By: sister Schedule Follow-up Appointment: Yes Clinical Summary of Care: Electronic Signature(s) Signed: 04/09/2022 4:52:26 PM By: Rosalio Loud MSN RN CNS WTA Entered By: Rosalio Loud on 04/09/2022 12:20:29 -------------------------------------------------------------------------------- Lower Extremity Assessment Details Patient Name: Date of Service: ENO [place], DO [name] 04/09/2022 11:30 A M Medical Record Number: [id number] Patient Account Number: [id number] Date of Birth/Sex: Treating RN: 04-21-1956 (66 y.o. [name] Primary Care Luciano Cinquemani: Leward Quan Other Clinician: [name] ([id number]) [id number] Page 4 of 9 Referring Jenavieve Freda: Treating Terrick Allred/Extender: Durwin Glaze in Treatment: 16 Electronic Signature(s) Signed: 04/09/2022 4:52:26 PM By: Rosalio Loud MSN RN CNS WTA Entered By: Rosalio Loud on 04/09/2022 12:13:47 -------------------------------------------------------------------------------- Multi Wound Chart Details Patient Name: Date of Service: ENO [place], DO UGLA S 04/09/2022 11:30 A M Medical Record Number: [id number] Patient Account Number: [id number] Date of Birth/Sex: Treating RN: [birth year] (66 y.o. [name] Primary Care Latarsha Zani: Leward Quan Other Clinician: Referring Richar Dunklee: Treating Matheson Vandehei/Extender: Durwin Glaze in Treatment: 16 Vital Signs Height(in): 76 Pulse(bpm): 87 Weight(lbs): 167 Blood Pressure(mmHg): 75/45 Body Mass Index(BMI): 20.3 Temperature(F): 98.1 Respiratory Rate(breaths/min): 16 [1:Photos:] [N/A:N/A] Midline Sacrum N/A N/A Wound Location: Pressure Injury N/A N/A Wounding Event: Pressure Ulcer N/A N/A Primary Etiology: Arrhythmia, Hypotension, History of N/A N/A Comorbid History: pressure wounds, Quadriplegia 10/18/2021 N/A N/A Date Acquired: 16 N/A N/A Weeks of Treatment: Open N/A N/A Wound Status: No N/A N/A Wound  Recurrence: 5x3x0.2 N/A N/A Measurements L x W x D (cm) 11.781 N/A N/A A (cm) : rea 2.356 N/A N/A Volume (cm) : -410.20% N/A N/A % Reduction in A rea: -410.00% N/A N/A % Reduction in Volume: Category/Stage III N/A N/A Classification: Medium N/A N/A Exudate A mount: Serous N/A N/A Exudate Type: amber N/A N/A Exudate Color: Epibole N/A N/A Wound Margin: Large (67-100%) N/A N/A Granulation A mount: Red N/A N/A Granulation Quality: Small (1-33%) N/A N/A Necrotic  A mount: Fat Layer (Subcutaneous Tissue): Yes N/A N/A Exposed Structures: Fascia: No Tendon: No Muscle: No Joint: No Bone: No Small (1-33%) N/A N/A Epithelialization: Liles, Ad ([id number]) [id number] Page 5 of 9 Treatment Notes Electronic Signature(s) Signed: 04/09/2022 4:52:26 PM By: Rosalio Loud MSN RN CNS WTA Entered By: Rosalio Loud on 04/09/2022 12:17:09 -------------------------------------------------------------------------------- Multi-Disciplinary Care Plan Details Patient Name: Date of Service: [facility], DO [name] 04/09/2022 11:30 A M Medical Record Number: [id number] Patient Account Number: [id number] Date of Birth/Sex: Treating RN: [birth year] (66 y.o. [name] Primary Care Eleftheria Taborn: Leward Quan Other Clinician: Referring Glenard Keesling: Treating Torrin Crihfield/Extender: Durwin Glaze in Treatment: 16 Active Inactive Pressure Nursing Diagnoses: Knowledge deficit related to management of pressures ulcers Goals: Patient will remain free from development of additional pressure ulcers Date Initiated: 12/18/2021 Target Resolution Date: 05/19/2022 Goal Status: Active Patient will remain free of pressure ulcers Date Initiated: 12/18/2021 Target Resolution Date: 05/19/2022 Goal Status: Active Patient/caregiver will verbalize risk factors for pressure ulcer development Date Initiated: 12/18/2021 Target Resolution Date: 06/16/2022 Goal Status:  Active Patient/caregiver will verbalize understanding of pressure ulcer management Date Initiated: 12/18/2021 Target Resolution Date: 05/19/2022 Goal Status: Active Interventions: Assess: immobility, friction, shearing, incontinence upon admission and as needed Assess offloading mechanisms upon admission and as needed Assess potential for pressure ulcer upon admission and as needed Provide education on pressure ulcers Treatment Activities: Patient referred for seating evaluation to ensure proper offloading : 12/18/2021 Notes: Wound/Skin Impairment Nursing Diagnoses: Impaired tissue integrity Knowledge deficit related to smoking impact on wound healing Knowledge deficit related to ulceration/compromised skin integrity Goals: Patient/caregiver will verbalize understanding of skin care regimen Date Initiated: 12/18/2021 Date Inactivated: 01/25/2022 Target Resolution Date: 12/18/2021 Goal Status: Met Leamy, Engelbert ([id number]) [id number] Page 6 of 9 Ulcer/skin breakdown will have a volume reduction of 30% by week 4 Date Initiated: 12/18/2021 Target Resolution Date: 05/19/2022 Goal Status: Active Interventions: Assess ulceration(s) every visit Treatment Activities: Referred to DME Juliann Olesky for dressing supplies : 12/18/2021 Skin care regimen initiated : 12/18/2021 Topical wound management initiated : 12/18/2021 Notes: Electronic Signature(s) Signed: 04/09/2022 4:52:26 PM By: Rosalio Loud MSN RN CNS WTA Entered By: Rosalio Loud on 04/09/2022 12:17:01 -------------------------------------------------------------------------------- Pain Assessment Details Patient Name: Date of Service: Yolanda Manges, DO UGLA S 04/09/2022 11:30 A M Medical Record Number: [id number] Patient Account Number: [id number] Date of Birth/Sex: Treating RN: 06/25/1955 (66 y.o. [name] Primary Care Sharday Michl: Leward Quan Other Clinician: Referring Koy Lamp: Treating Evony Rezek/Extender: Durwin Glaze in Treatment: 16 Active Problems Location of Pain Severity and Description of Pain Patient Has Paino Yes Site Locations Pain Location: Pain in Ulcers With Dressing Change: No Duration of the Pain. Constant / Intermittento Intermittent Rate the pain. Current Pain Level: 5 Character of Pain Describe the Pain: Tender Pain Management and Medication Current Pain Management: Electronic Signature(s) Signed: 04/09/2022 4:52:26 PM By: Rosalio Loud MSN RN CNS WTA Entered By: Rosalio Loud on 04/09/2022 12:11:26 [place], [place] ([id number]) [id number] Page 7 of 9 -------------------------------------------------------------------------------- Patient/Caregiver Education Details Patient Name: Date of Service: [name] 10/23/2023andnbsp11:30 A M Medical Record Number: [id number] Patient Account Number: [id number] Date of Birth/Gender: Treating RN: [birth year] (66 y.o. [name] Primary Care Physician: Leward Quan Other Clinician: Referring Physician: Treating Physician/Extender: Durwin Glaze in Treatment: 16 Education Assessment Education Provided To: Patient Education Topics Provided Pressure: Handouts: Pressure Ulcers: Care and Offloading Methods: Demonstration, Explain/Verbal Responses: State content correctly Electronic Signature(s) Signed: 04/09/2022 4:52:26 PM By: Rosalio Loud  MSN RN CNS WTA Entered By: Rosalio Loud on 04/09/2022 12:19:31 -------------------------------------------------------------------------------- Wound Assessment Details Patient Name: Date of Service: [name] 04/09/2022 11:30 A M Medical Record Number: [id number] Patient Account Number: [id number] Date of Birth/Sex: Treating RN: [birth year] (66 y.o. [name] Primary Care Ryoma Nofziger: Leward Quan Other Clinician: Referring Eisley Barber: Treating Imri Lor/Extender: Durwin Glaze in Treatment: 16 Wound  Status Wound Number: 1 Primary Pressure Ulcer Etiology: Wound Location: Midline Sacrum Wound Status: Open Wounding Event: Pressure Injury Comorbid Arrhythmia, Hypotension, History of pressure wounds, Date Acquired: 10/18/2021 History: Quadriplegia Weeks Of Treatment: 16 Clustered Wound: No Photos [place], Mattia ([id number]) [id number] Page 8 of 9 Wound Measurements Length: (cm) 5 Width: (cm) 3 Depth: (cm) 0.2 Area: (cm) 11.781 Volume: (cm) 2.356 % Reduction in Area: -410.2% % Reduction in Volume: -410% Epithelialization: Small (1-33%) Tunneling: No Undermining: No Wound Description Classification: Category/Stage III Wound Margin: Epibole Exudate Amount: Medium Exudate Type: Serous Exudate Color: amber Foul Odor After Cleansing: No Slough/Fibrino Yes Wound Bed Granulation Amount: Large (67-100%) Exposed Structure Granulation Quality: Red Fascia Exposed: No Necrotic Amount: Small (1-33%) Fat Layer (Subcutaneous Tissue) Exposed: Yes Necrotic Quality: Adherent Slough Tendon Exposed: No Muscle Exposed: No Joint Exposed: No Bone Exposed: No Treatment Notes Wound #1 (Sacrum) Wound Laterality: Midline Cleanser Soap and Water Discharge Instruction: Gently cleanse wound with antibacterial soap, rinse and pat dry prior to dressing wounds Peri-Wound Care Topical Primary Dressing Hydrofera Blue Ready Transfer Foam, 4x5 (in/in) Discharge Instruction: Apply Hydrofera Blue Ready to wound bed as directed Secondary Dressing (BORDER) Zetuvit Plus SILICONE BORDER Dressing 4x4 (in/in) Discharge Instruction: Please do not put silicone bordered dressings under wraps. Use non-bordered dressing only. When applying please make sure skin is pulled flat so dressing stays in place. Secured With Compression Wrap Compression Stockings Environmental education officer) Signed: 04/09/2022 4:52:26 PM By: Rosalio Loud MSN RN CNS WTA Entered By: Rosalio Loud on  04/09/2022 12:13:36 [place], [place] ([id number]) [id number] Page 9 of 9 -------------------------------------------------------------------------------- Vitals Details Patient Name: Date of Service: [name] 04/09/2022 11:30 A M Medical Record Number: [id number] Patient Account Number: [id number] Date of Birth/Sex: Treating RN: 1955/09/21 (66 y.o. [name] Primary Care Brandyn Lowrey: Leward Quan Other Clinician: Referring Kristell Wooding: Treating Jyllian Haynie/Extender: Durwin Glaze in Treatment: 16 Vital Signs Time Taken: 12:10 Temperature (F): 98.1 Height (in): 76 Pulse (bpm): 87 Weight (lbs): 167 Respiratory Rate (breaths/min): 16 Body Mass Index (BMI): 20.3 Blood Pressure (mmHg): 75/45 Reference Range: 80 - 120 mg / dl Electronic Signature(s) Signed: 04/09/2022 4:52:26 PM By: Rosalio Loud MSN RN CNS WTA Entered By: Rosalio Loud on 04/09/2022 12:11:10

## 2022-04-10 ENCOUNTER — Ambulatory Visit: Admission: RE | Admit: 2022-04-10 | Payer: Medicare (Managed Care) | Source: Ambulatory Visit

## 2022-04-10 DIAGNOSIS — C61 Malignant neoplasm of prostate: Secondary | ICD-10-CM | POA: Insufficient documentation

## 2022-04-17 DIAGNOSIS — C61 Malignant neoplasm of prostate: Secondary | ICD-10-CM | POA: Diagnosis not present

## 2022-04-20 ENCOUNTER — Other Ambulatory Visit: Payer: Self-pay | Admitting: *Deleted

## 2022-04-20 DIAGNOSIS — C61 Malignant neoplasm of prostate: Secondary | ICD-10-CM

## 2022-04-23 ENCOUNTER — Ambulatory Visit
Admission: RE | Admit: 2022-04-23 | Discharge: 2022-04-23 | Disposition: A | Discharge status: Home / Self Care | Payer: Medicare (Managed Care) | Source: Ambulatory Visit | Attending: Radiation Oncology | Admitting: Radiation Oncology

## 2022-04-23 ENCOUNTER — Ambulatory Visit: Payer: Medicare (Managed Care) | Admitting: Internal Medicine

## 2022-04-23 ENCOUNTER — Other Ambulatory Visit: Payer: Self-pay

## 2022-04-23 ENCOUNTER — Ambulatory Visit: Payer: Medicare (Managed Care)

## 2022-04-23 DIAGNOSIS — C61 Malignant neoplasm of prostate: Secondary | ICD-10-CM | POA: Diagnosis not present

## 2022-04-23 LAB — RAD ONC ARIA SESSION SUMMARY
Course Elapsed Days: 0
Plan Fractions Treated to Date: 1
Plan Prescribed Dose Per Fraction: 2.5 Gy
Plan Total Fractions Prescribed: 28
Plan Total Prescribed Dose: 70 Gy
Reference Point Dosage Given to Date: 2.5 Gy
Reference Point Session Dosage Given: 2.5 Gy
Session Number: 1

## 2022-04-24 ENCOUNTER — Ambulatory Visit
Admission: RE | Admit: 2022-04-24 | Discharge: 2022-04-24 | Disposition: A | Discharge status: Home / Self Care | Payer: Medicare (Managed Care) | Source: Ambulatory Visit | Attending: Radiation Oncology | Admitting: Radiation Oncology

## 2022-04-24 ENCOUNTER — Other Ambulatory Visit: Payer: Self-pay

## 2022-04-24 DIAGNOSIS — C61 Malignant neoplasm of prostate: Secondary | ICD-10-CM | POA: Diagnosis not present

## 2022-04-24 LAB — RAD ONC ARIA SESSION SUMMARY
Course Elapsed Days: 1
Plan Fractions Treated to Date: 2
Plan Prescribed Dose Per Fraction: 2.5 Gy
Plan Total Fractions Prescribed: 28
Plan Total Prescribed Dose: 70 Gy
Reference Point Dosage Given to Date: 5 Gy
Reference Point Session Dosage Given: 2.5 Gy
Session Number: 2

## 2022-04-25 ENCOUNTER — Ambulatory Visit: Payer: Medicare (Managed Care)

## 2022-04-26 ENCOUNTER — Ambulatory Visit
Admission: RE | Admit: 2022-04-26 | Discharge: 2022-04-26 | Disposition: A | Discharge status: Home / Self Care | Payer: Medicare (Managed Care) | Source: Ambulatory Visit | Attending: Radiation Oncology | Admitting: Radiation Oncology

## 2022-04-26 ENCOUNTER — Other Ambulatory Visit: Payer: Self-pay

## 2022-04-26 ENCOUNTER — Ambulatory Visit: Payer: Medicare (Managed Care)

## 2022-04-26 DIAGNOSIS — C61 Malignant neoplasm of prostate: Secondary | ICD-10-CM | POA: Diagnosis not present

## 2022-04-26 LAB — RAD ONC ARIA SESSION SUMMARY
Course Elapsed Days: 3
Plan Fractions Treated to Date: 3
Plan Prescribed Dose Per Fraction: 2.5 Gy
Plan Total Fractions Prescribed: 28
Plan Total Prescribed Dose: 70 Gy
Reference Point Dosage Given to Date: 7.5 Gy
Reference Point Session Dosage Given: 2.5 Gy
Session Number: 3

## 2022-04-27 ENCOUNTER — Ambulatory Visit: Payer: Medicare (Managed Care)

## 2022-04-30 ENCOUNTER — Ambulatory Visit
Admission: RE | Admit: 2022-04-30 | Discharge: 2022-04-30 | Disposition: A | Discharge status: Home / Self Care | Payer: Medicare (Managed Care) | Source: Ambulatory Visit | Attending: Radiation Oncology | Admitting: Radiation Oncology

## 2022-04-30 ENCOUNTER — Ambulatory Visit: Payer: Medicare (Managed Care)

## 2022-04-30 ENCOUNTER — Other Ambulatory Visit: Payer: Self-pay

## 2022-04-30 ENCOUNTER — Ambulatory Visit: Payer: Medicare (Managed Care) | Admitting: Internal Medicine

## 2022-04-30 DIAGNOSIS — C61 Malignant neoplasm of prostate: Secondary | ICD-10-CM | POA: Diagnosis not present

## 2022-04-30 LAB — RAD ONC ARIA SESSION SUMMARY
Course Elapsed Days: 7
Plan Fractions Treated to Date: 4
Plan Prescribed Dose Per Fraction: 2.5 Gy
Plan Total Fractions Prescribed: 28
Plan Total Prescribed Dose: 70 Gy
Reference Point Dosage Given to Date: 10 Gy
Reference Point Session Dosage Given: 2.5 Gy
Session Number: 4

## 2022-05-01 ENCOUNTER — Other Ambulatory Visit: Payer: Self-pay

## 2022-05-01 ENCOUNTER — Ambulatory Visit: Payer: Medicare (Managed Care)

## 2022-05-01 ENCOUNTER — Ambulatory Visit
Admission: RE | Admit: 2022-05-01 | Discharge: 2022-05-01 | Disposition: A | Discharge status: Home / Self Care | Payer: Medicare (Managed Care) | Source: Ambulatory Visit | Attending: Radiation Oncology | Admitting: Radiation Oncology

## 2022-05-01 DIAGNOSIS — C61 Malignant neoplasm of prostate: Secondary | ICD-10-CM | POA: Diagnosis not present

## 2022-05-01 LAB — RAD ONC ARIA SESSION SUMMARY
Course Elapsed Days: 8
Plan Fractions Treated to Date: 5
Plan Prescribed Dose Per Fraction: 2.5 Gy
Plan Total Fractions Prescribed: 28
Plan Total Prescribed Dose: 70 Gy
Reference Point Dosage Given to Date: 12.5 Gy
Reference Point Session Dosage Given: 2.5 Gy
Session Number: 5

## 2022-05-02 ENCOUNTER — Other Ambulatory Visit: Payer: Self-pay

## 2022-05-02 ENCOUNTER — Inpatient Hospital Stay: Payer: Medicare (Managed Care)

## 2022-05-02 ENCOUNTER — Ambulatory Visit
Admission: RE | Admit: 2022-05-02 | Discharge: 2022-05-02 | Disposition: A | Discharge status: Home / Self Care | Payer: Medicare (Managed Care) | Source: Ambulatory Visit | Attending: Radiation Oncology | Admitting: Radiation Oncology

## 2022-05-02 ENCOUNTER — Ambulatory Visit: Payer: Medicare (Managed Care)

## 2022-05-02 DIAGNOSIS — C61 Malignant neoplasm of prostate: Secondary | ICD-10-CM | POA: Diagnosis not present

## 2022-05-02 LAB — RAD ONC ARIA SESSION SUMMARY
Course Elapsed Days: 9
Plan Fractions Treated to Date: 6
Plan Prescribed Dose Per Fraction: 2.5 Gy
Plan Total Fractions Prescribed: 28
Plan Total Prescribed Dose: 70 Gy
Reference Point Dosage Given to Date: 15 Gy
Reference Point Session Dosage Given: 2.5 Gy
Session Number: 6

## 2022-05-02 LAB — CBC
HCT: 42.8 % (ref 39.0–52.0)
Hemoglobin: 13.5 g/dL (ref 13.0–17.0)
MCH: 27.6 pg (ref 26.0–34.0)
MCHC: 31.5 g/dL (ref 30.0–36.0)
MCV: 87.5 fL (ref 80.0–100.0)
Platelets: 203 10*3/uL (ref 150–400)
RBC: 4.89 MIL/uL (ref 4.22–5.81)
RDW: 13.1 % (ref 11.5–15.5)
WBC: 3.6 10*3/uL — ABNORMAL LOW (ref 4.0–10.5)
nRBC: 0 % (ref 0.0–0.2)

## 2022-05-03 ENCOUNTER — Encounter: Payer: Medicare (Managed Care) | Attending: Physician Assistant | Admitting: Internal Medicine

## 2022-05-03 ENCOUNTER — Other Ambulatory Visit: Payer: Self-pay

## 2022-05-03 ENCOUNTER — Ambulatory Visit: Payer: Medicare (Managed Care)

## 2022-05-03 ENCOUNTER — Ambulatory Visit
Admission: RE | Admit: 2022-05-03 | Discharge: 2022-05-03 | Disposition: A | Discharge status: Home / Self Care | Payer: Medicare (Managed Care) | Source: Ambulatory Visit | Attending: Radiation Oncology | Admitting: Radiation Oncology

## 2022-05-03 DIAGNOSIS — I480 Paroxysmal atrial fibrillation: Secondary | ICD-10-CM | POA: Diagnosis not present

## 2022-05-03 DIAGNOSIS — I9589 Other hypotension: Secondary | ICD-10-CM | POA: Insufficient documentation

## 2022-05-03 DIAGNOSIS — C61 Malignant neoplasm of prostate: Secondary | ICD-10-CM | POA: Diagnosis not present

## 2022-05-03 DIAGNOSIS — G8254 Quadriplegia, C5-C7 incomplete: Secondary | ICD-10-CM | POA: Diagnosis not present

## 2022-05-03 DIAGNOSIS — L89153 Pressure ulcer of sacral region, stage 3: Secondary | ICD-10-CM | POA: Diagnosis not present

## 2022-05-03 LAB — RAD ONC ARIA SESSION SUMMARY
Course Elapsed Days: 10
Plan Fractions Treated to Date: 7
Plan Prescribed Dose Per Fraction: 2.5 Gy
Plan Total Fractions Prescribed: 28
Plan Total Prescribed Dose: 70 Gy
Reference Point Dosage Given to Date: 17.5 Gy
Reference Point Session Dosage Given: 2.5 Gy
Session Number: 7

## 2022-05-04 ENCOUNTER — Ambulatory Visit
Admission: RE | Admit: 2022-05-04 | Discharge: 2022-05-04 | Disposition: A | Discharge status: Home / Self Care | Payer: Medicare (Managed Care) | Source: Ambulatory Visit | Attending: Radiation Oncology | Admitting: Radiation Oncology

## 2022-05-04 ENCOUNTER — Other Ambulatory Visit: Payer: Self-pay

## 2022-05-04 ENCOUNTER — Ambulatory Visit: Payer: Medicare (Managed Care)

## 2022-05-04 DIAGNOSIS — C61 Malignant neoplasm of prostate: Secondary | ICD-10-CM | POA: Diagnosis not present

## 2022-05-04 LAB — RAD ONC ARIA SESSION SUMMARY
Course Elapsed Days: 11
Plan Fractions Treated to Date: 8
Plan Prescribed Dose Per Fraction: 2.5 Gy
Plan Total Fractions Prescribed: 28
Plan Total Prescribed Dose: 70 Gy
Reference Point Dosage Given to Date: 20 Gy
Reference Point Session Dosage Given: 2.5 Gy
Session Number: 8

## 2022-05-04 NOTE — Progress Notes (Signed)
[place], [name] ([id number]) [id number] Page 1 of 7 Visit Report for 05/03/2022 Chief Complaint Document Details Patient Name: Date of Service: [name] 05/03/2022 12:30 PM Medical Record Number: [id number] Patient Account Number: [id number] Date of Birth/Sex: Treating RN: 1956/06/06 (66 y.o. [name]) Carlene Coria Primary Care Provider: Leward [name] Other Clinician: Referring Provider: Treating Provider/Extender: [name] in Treatment: 19 Information Obtained from: Patient Chief Complaint Sacral pressure ulcer Electronic Signature(s) Signed: 05/04/2022 9:05:43 AM By: Worthy Keeler PA-C Entered By: Worthy Keeler on 05/04/2022 09:05:43 -------------------------------------------------------------------------------- HPI Details Patient Name: Date of Service: Michael Paradise, DO UGLA S 05/03/2022 12:30 PM Medical Record Number: [id number] Patient Account Number: [id number] Date of Birth/Sex: Treating RN: Oct 31, 1955 (66 y.o. [name]) Carlene Coria Primary Care Provider: Leward [name] Other Clinician: Referring Provider: Treating Provider/Extender: [name] in Treatment: 19 History of Present Illness HPI Description: 12-18-2021 upon evaluation today patient presents for initial evaluation here in the clinic concerning an issue he has been having over the sacral area where he has a wound this is a stage III pressure ulcer. It also appears there may still be some moisture component to this based on what I am seeing. I do not see any evidence of infection but at the same time I do believe that the patient is in need of some additional and hopefully better dressings in order to help this heal more appropriately and quickly. Currently just a protective dressing has been used over this sounds like may possibly be a border foam. The patient did have a CT scan which was negative for osteomyelitis he had a previous wound above this in the sacral  region but not necessarily right of this area. 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 He also has atrial fibrillation and hypotension. 01-01-2022 upon evaluation today patient appears to be doing well with regard to his wound actually see some signs of improvement there is still quite a bit of feeling to be done here but nonetheless I think we are on the right track. I do not see any evidence of active infection locally or systemically at this time which is great news. 01-25-2022 upon evaluation today patient's wound really is not significantly better it actually appears about the same. Of note I do believe that part of the issue here is probably the fact that honestly it is hard to keep the dressing in place I think that when the alginate is put on it needs to be cut bigger than what is currently being seen on the dressing today probably by about 3 times that size and placed directly to the wound bed while spreading the butt cheeks bilaterally. This will allow for the dressing to be right intact with the wound bed so that when it does try to closing on itself it will actually fold on the alginate and not [facility] [place], [name] ([id number]) [id number] Page 2 of 7 actually contact skin to skin. 02-09-2022 upon evaluation today patient appears to be doing about the same in regard to his wound. Again I do not think the dressings being quite put on correctly keeping the alginate in contact with the wound bed this just does not seem to be quite doing the job. I think we may need to switch to Dakin's moistened gauze dressing this is a very precarious location I am not even sure that he is not getting some feces into this dressing and underneath. I think it  is probably going to be changed daily and switch again to the Dakin's. 02-23-2022 upon evaluation today patient unfortunately still continues to have issues with his sacral area. Again this is a very  precarious spot but honestly I think is made worse by the fact that he is up sitting the majority of the day. He typically gets up by 10 he tells me and does not go to bed until around 11 or 12 this is a good solid at least 12 to 14 hours that he is up day in and day out. With that being said I think that this is probably one of the main reasons why this wound is not healing the other being obviously moisture control I do think the surface of the wound looks better this week compared to previous with the Dakin's but at the same time the wound itself does not appear to be any smaller. 03-12-2022 upon evaluation today patient appears to be doing poorly currently in regard to the wound and sacral region. Unfortunately this is not showing signs of improvement and he actually has a small area of deep tissue injury right in the middle right at the point where the bone of the coccyx is pushing onto the wound bed. Again I think that this is likely end up opening up into a much deeper wound that extends down to bone if he is unable to get off of this. So far he has not been able to really do this effectively tells me that he has been "trying" but only succeeding minimally. 03-27-2022 upon evaluation today patient appears to be doing a little better in regard to his wound compared to last time I saw him. At that point he started to have some breakdown of the central portion of the wound the good news is that has improved. 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 04-09-2022 upon evaluation today patient's wound is actually showing signs of doing okay. Its not significantly smaller and is also not any larger nor showing signs of breaking down. He tells me he is still trying to stay off of this on the weekends. He also tells me during the week he is going to the pace program more frequently 3 times a  week at this point. With that being said overall I am pleased in that regard and I am happy that he is doing somewhat better. With that being said I explained to the patient as well that I do believe he still needs to be offloading is much as he can. 05-03-2022 upon evaluation today patient appears to be doing well currently in regard to his wound from the standpoint of it not being infected I do not see anything obvious at this point which is great news. Fortunately I do not see any signs of active infection locally or systemically at this well which is also great news. Overall I do believe that the biggest issue simply is just that he is going to have a lot of trouble getting this to heal simply due to the fact that he has a lot of issues getting off of this routinely and even so it is a very difficult location even in the best of circumstances. Again its not deep enough for wound VAC right now. Hopefully will not get to that point. Electronic Signature(s) Signed: 05/04/2022 9:07:04 AM By: Worthy Keeler  PA-C Entered By: Worthy Keeler on 05/04/2022 09:07:04 -------------------------------------------------------------------------------- Physical Exam Details Patient Name: Date of Service: [name] 05/03/2022 12:30 PM Medical Record Number: [id number] Patient Account Number: [id number] Date of Birth/Sex: Treating RN: 05/19/1956 (66 y.o. [name]) Carlene Coria Primary Care Provider: Leward [name] Other Clinician: Referring Provider: Treating Provider/Extender: [name] in Treatment: 49 Constitutional Well-nourished and well-hydrated in no acute distress. Respiratory normal breathing without difficulty. Psychiatric this patient is able to make decisions and demonstrates good insight into disease process. Alert and Oriented x 3. pleasant and cooperative. Notes Upon inspection patient's wound bed actually showed signs of good granulation and epithelization at this point.  He actually had some evidence of new skin growing around the edges especially in the inferior portion of the wound and I am pleased in that regard. With that being said I do not see any signs of active infection at this time which is great news. Electronic Signature(s) Signed: 05/04/2022 9:07:26 AM By: Worthy Keeler PA-C Entered By: Worthy Keeler on 05/04/2022 09:07:25 [place], Mylz ([id number]) [id number] Page 3 of 7 -------------------------------------------------------------------------------- Physician Orders Details Patient Name: Date of Service: [name] 05/03/2022 12:30 PM Medical Record Number: [id number] Patient Account Number: [id number] Date of Birth/Sex: Treating RN: 12/04/1955 (66 y.o. [name]) Carlene Coria Primary Care Provider: Leward [name] Other Clinician: Referring Provider: Treating Provider/Extender: [name] in Treatment: 97 Verbal / Phone Orders: No Diagnosis Coding Follow-up Appointments Wound #1 Midline Sacrum Return Appointment in 2 weeks. Home Health Wound #1 [place]: - [place] for wound care. May utilize formulary equivalent dressing for wound treatment orders unless otherwise specified. Home Health Nurse may visit PRN to address patients wound care needs. Scheduled days for dressing changes to be completed; exception, patient has scheduled wound care visit that day. **Please direct any NON-WOUND related issues/requests for orders to patient's Primary Care Physician. **If current dressing causes regression in wound condition, may D/C ordered dressing product/s and apply Normal Saline Moist Dressing daily until next [place] or Other MD appointment. **Notify [facility]aling Center of regression in wound condition at [phone]. Bathing/ Shower/ Hygiene May shower; gently cleanse wound with antibacterial soap, rinse and pat dry prior to dressing  wounds No tub bath. Off-Loading Roho cushion for wheelchair Turn and reposition every 2 hours Additional Orders / Instructions Follow Nutritious Diet and Increase Protein Intake Wound Treatment Wound #1 - Sacrum Wound Laterality: Midline Cleanser: Soap and Water 1 x Per Day/30 Days Discharge Instructions: Gently cleanse wound with antibacterial soap, rinse and pat dry prior to dressing wounds Prim Dressing: Hydrofera Blue Ready Transfer Foam, 4x5 (in/in) (Dispense As Written) 1 x Per Day/30 Days ary Discharge Instructions: Apply Hydrofera Blue Ready to wound bed as directed Secondary Dressing: (BORDER) Zetuvit Plus SILICONE BORDER Dressing 4x4 (in/in) 1 x Per Day/30 Days Discharge Instructions: Please do not put silicone bordered dressings under wraps. Use non-bordered dressing only. When applying please make sure skin is pulled flat so dressing stays in place. Electronic Signature(s) Signed: 05/04/2022 1:14:49 PM By: Carlene Coria RN Signed: 05/04/2022 2:32:55 PM By: Worthy Keeler PA-C Entered By: Carlene Coria on 05/03/2022 13:13:48 Stelzer, Thayer ([id number]) [id number] Page 4 of 7 -------------------------------------------------------------------------------- Problem List Details Patient Name: Date of Service: [name] 05/03/2022 12:30 PM Medical Record Number: [id number] Patient Account Number: [id number] Date of Birth/Sex: Treating RN: 03/12/1956 (66 y.o. [name]) Carlene Coria Primary Care  Provider: Leward [name] Other Clinician: Referring Provider: Treating Provider/Extender: [name] in Treatment: 19 Active Problems ICD-10 Encounter Code Description Active Date MDM Diagnosis L89.153 Pressure ulcer of sacral region, stage 3 12/18/2021 No Yes G82.54 Quadriplegia, C5-C7 incomplete 12/18/2021 No Yes I48.0 Paroxysmal atrial fibrillation 12/18/2021 No Yes I95.89 Other hypotension 12/18/2021 No Yes Inactive Problems Resolved  Problems Electronic Signature(s) Signed: 05/04/2022 9:05:38 AM By: Worthy Keeler PA-C Entered By: Worthy Keeler on 05/04/2022 09:05:38 -------------------------------------------------------------------------------- Progress Note Details Patient Name: Date of Service: Michael [place], DO UGLA S 05/03/2022 12:30 PM Medical Record Number: [id number] Patient Account Number: [id number] Date of Birth/Sex: Treating RN: December 27, 1955 (66 y.o. Oval Linsey Primary Care Provider: Leward [name] Other Clinician: Referring Provider: Treating Provider/Extender: [name] in Treatment: [address] [place] ([id number]) [id number] Page 5 of 7 Subjective Chief Complaint Information obtained from Patient Sacral pressure ulcer History of Present Illness (HPI) 12-18-2021 upon evaluation today patient presents for initial evaluation here in the clinic concerning an issue he has been having over the sacral area where he has a wound this is a stage III pressure ulcer. It also appears there may still be some moisture component to this based on what I am seeing. I do not see any evidence of infection but at the same time I do believe that the patient is in need of some additional and hopefully better dressings in order to help this heal more appropriately and quickly. Currently just a protective dressing has been used over this sounds like may possibly be a border foam. The patient did have a CT scan which was negative for osteomyelitis he had a previous wound above this in the sacral region but not necessarily right of this area. 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 He also has atrial fibrillation and hypotension. 01-01-2022 upon evaluation today patient appears to be doing well with regard to his wound actually see some signs of improvement there is still quite a bit of feeling to be done here but nonetheless I think we are on the right  track. I do not see any evidence of active infection locally or systemically at this time which is great news. 01-25-2022 upon evaluation today patient's wound really is not significantly better it actually appears about the same. Of note I do believe that part of the issue here is probably the fact that honestly it is hard to keep the dressing in place I think that when the alginate is put on it needs to be cut bigger than what is currently being seen on the dressing today probably by about 3 times that size and placed directly to the wound bed while spreading the butt cheeks bilaterally. This will allow for the dressing to be right intact with the wound bed so that when it does try to closing on itself it will actually fold on the alginate and not actually contact skin to skin. 02-09-2022 upon evaluation today patient appears to be doing about the same in regard to his wound. Again I do not think the dressings being quite put on correctly keeping the alginate in contact with the wound bed this just does not seem to be quite doing the job. I think we may need to switch to Dakin's moistened gauze dressing this is a very precarious location I am not even sure that he is not getting some feces into this dressing and underneath. I think it is probably going  to be changed daily and switch again to the Dakin's. 02-23-2022 upon evaluation today patient unfortunately still continues to have issues with his sacral area. Again this is a very precarious spot but honestly I think is made worse by the fact that he is up sitting the majority of the day. He typically gets up by 10 he tells me and does not go to bed until around 11 or 12 this is a good solid at least 12 to 14 hours that he is up day in and day out. With that being said I think that this is probably one of the main reasons why this wound is not healing the other being obviously moisture control I do think the surface of the wound looks better this week  compared to previous with the Dakin's but at the same time the wound itself does not appear to be any smaller. 03-12-2022 upon evaluation today patient appears to be doing poorly currently in regard to the wound and sacral region. Unfortunately this is not showing signs of improvement and he actually has a small area of deep tissue injury right in the middle right at the point where the bone of the coccyx is pushing onto the wound bed. Again I think that this is likely end up opening up into a much deeper wound that extends down to bone if he is unable to get off of this. So far he has not been able to really do this effectively tells me that he has been "trying" but only succeeding minimally. 03-27-2022 upon evaluation today patient appears to be doing a little better in regard to his wound compared to last time I saw him. At that point he started to have some breakdown of the central portion of the wound the good news is that has improved. 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 04-09-2022 upon evaluation today patient's wound is actually showing signs of doing okay. Its not significantly smaller and is also not any larger nor showing signs of breaking down. He tells me he is still trying to stay off of this on the weekends. He also tells me during the week he is going to the pace program more frequently 3 times a week at this point. With that being said overall I am pleased in that regard and I am happy that he is doing somewhat better. With that being said I explained to the patient as well that I do believe he still needs to be offloading is much as he can. 05-03-2022 upon evaluation today patient appears to be doing well currently in regard to his wound from the standpoint of it not being infected I do not see anything obvious at this point which is great news. Fortunately I do not  see any signs of active infection locally or systemically at this well which is also great news. Overall I do believe that the biggest issue simply is just that he is going to have a lot of trouble getting this to heal simply due to the fact that he has a lot of issues getting off of this routinely and even so it is a very difficult location even in the best of circumstances. Again its not deep enough for wound VAC right now. Hopefully will not get to that point. Objective Constitutional Well-nourished and well-hydrated in no acute distress. Vitals Time Taken: 12:55  PM, Height: 76 in, Weight: 167 lbs, BMI: 20.3, Temperature: 98.3 F, Pulse: 71 bpm, Respiratory Rate: 18 breaths/min, Blood Pressure: 145/85 mmHg. Respiratory normal breathing without difficulty. Psychiatric this patient is able to make decisions and demonstrates good insight into disease process. Alert and Oriented x 3. pleasant and cooperative. General Notes: Upon inspection patient's wound bed actually showed signs of good granulation and epithelization at this point. He actually had some evidence of new skin growing around the edges especially in the inferior portion of the wound and I am pleased in that regard. With that being said I do not see any signs of active infection at this time which is great news. Integumentary (Hair, Skin) Wound #1 status is Open. Original cause of wound was Pressure Injury. The date acquired was: 10/18/2021. The wound has been in treatment 19 weeks. The wound is located on the Midline Sacrum. The wound measures 6cm length x 3cm width x 1.2cm depth; 14.137cm^2 area and 16.965cm^3 volume. There is Fat Layer (Subcutaneous Tissue) exposed. There is no tunneling or undermining noted. There is a medium amount of serous drainage noted. The wound margin is epibole. There is large (67-100%) red granulation within the wound bed. There is a small (1-33%) amount of necrotic tissue within the wound bed  including [facility], [name] ([id number]) [id number] Page 6 of 7 Adherent [place]. Assessment Active Problems ICD-10 Pressure ulcer of sacral region, stage 3 Quadriplegia, C5-C7 incomplete Paroxysmal atrial fibrillation Other hypotension Plan Follow-up Appointments: Wound #1 Midline Sacrum: Return Appointment in 2 weeks. Home Health: Wound #1 Midline Sacrum: [place]: - [place] for wound care. May utilize formulary equivalent dressing for wound treatment orders unless otherwise specified. Home Health Nurse may visit PRN to address patientoos wound care needs. Scheduled days for dressing changes to be completed; exception, patient has scheduled wound care visit that day. **Please direct any NON-WOUND related issues/requests for orders to patient's Primary Care Physician. **If current dressing causes regression in wound condition, may D/C ordered dressing product/s and apply Normal Saline Moist Dressing daily until next [place] or Other MD appointment. **Notify [facility] of regression in wound condition at [phone]. Bathing/ Shower/ Hygiene: May shower; gently cleanse wound with antibacterial soap, rinse and pat dry prior to dressing wounds No tub bath. Off-Loading: Roho cushion for wheelchair Turn and reposition every 2 hours Additional Orders / Instructions: Follow Nutritious Diet and Increase Protein Intake WOUND #1: - Sacrum Wound Laterality: Midline Cleanser: Soap and Water 1 x Per Day/30 Days Discharge Instructions: Gently cleanse wound with antibacterial soap, rinse and pat dry prior to dressing wounds Prim Dressing: Hydrofera Blue Ready Transfer Foam, 4x5 (in/in) (Dispense As Written) 1 x Per Day/30 Days ary Discharge Instructions: Apply Hydrofera Blue Ready to wound bed as directed Secondary Dressing: (BORDER) Zetuvit Plus SILICONE BORDER Dressing 4x4 (in/in) 1 x Per Day/30 Days Discharge  Instructions: Please do not put silicone bordered dressings under wraps. Use non-bordered dressing only. When applying please make sure skin is pulled flat so dressing stays in place. 1. I am going to recommend that we have the patient continue with the [facility] at this point which I think is still probably will be the best way to go. 2. Also can recommend that we have the patient continue to monitor for any signs of worsening infection obviously if any thing changes he should let me know. Right now I think the biggest thing is going to be to keep this clean as much  as possible and offloaded. 3. He is trying to be more aggressive in the offloading side of things which is good news and that may be why we are seeing a little bit of new skin growth at this point. With that being said we definitely need to keep this open and not let it touch or closed on itself or else is going to stay wet and will not really continue to heal at all. He voiced understanding. He does seem to be trying to do what is recommended which I commended him for. This is just a very difficult situation and right now he is also having prostate radiation which were unsure whether this is going to affect his healing at this location or not they are trying to avoid affecting this area. We will see patient back for reevaluation in 1 week here in the clinic. If anything worsens or changes patient will contact our office for additional recommendations. Electronic Signature(s) Signed: 05/04/2022 9:08:31 AM By: Worthy Keeler PA-C Entered By: Worthy Keeler on 05/04/2022 09:08:31 [place], [place] ([id number]) [id number] Page 7 of 7 -------------------------------------------------------------------------------- SuperBill Details Patient Name: Date of Service: [name] 05/03/2022 Medical Record Number: [id number] Patient Account Number: [id number] Date of Birth/Sex: Treating RN: 1956-04-01 (66  y.o. [name]) Carlene Coria Primary Care Provider: Leward [name] Other Clinician: Referring Provider: Treating Provider/Extender: [name] in Treatment: 19 Diagnosis Coding ICD-10 Codes Code Description [id number] Pressure ulcer of sacral region, stage 3 G82.54 Quadriplegia, C5-C7 incomplete I48.0 Paroxysmal atrial fibrillation I95.89 Other hypotension Facility Procedures : CPT4 Code: [id number] Description: [phone] - WOUND CARE VISIT-LEV 2 EST PT Modifier: Quantity: 1 Physician Procedures : CPT4 Code Description Modifier [id number] - WC PHYS LEVEL 3 - EST PT ICD-10 Diagnosis Description L89.153 Pressure ulcer of sacral region, stage 3 G82.54 Quadriplegia, C5-C7 incomplete I48.0 Paroxysmal atrial fibrillation I95.89 Other hypotension Quantity: 1 Electronic Signature(s) Signed: 05/04/2022 9:09:00 AM By: Worthy Keeler PA-C Entered By: Worthy Keeler on 05/04/2022 09:09:00

## 2022-05-04 NOTE — Progress Notes (Signed)
[place], [place] [employer] ([id number]) [id number] Page 1 of 9 Visit Report for 05/03/2022 Arrival Information Details Patient Name: Date of Service: [name] 05/03/2022 12:30 PM Medical Record Number: [id number] Patient Account Number: [id number] Date of Birth/Sex: Treating RN: [birth year] (66 y.o. [name]) Carlene Coria Primary Care Shelita Steptoe: Leward Quan Other Clinician: Referring Sahvannah Rieser: Treating Longino Trefz/Extender: Durwin Glaze in Treatment: 19 Visit Information History Since Last Visit All ordered tests and consults were completed: No Patient Arrived: Wheel Chair Added or deleted any medications: No Arrival Time: 12:38 Any new allergies or adverse reactions: No Accompanied By: sister Had a fall or experienced change in No Transfer Assistance: [employer] fast streamer activities of daily living that may affect Patient Identification Verified: Yes risk of falls: Secondary Verification Process Completed: Yes Signs or symptoms of abuse/neglect since last visito No Patient Requires Transmission-Based Precautions: No Hospitalized since last visit: No Patient Has Alerts: Yes Implantable device outside of the clinic excluding No Patient Alerts: NOT DIABETIC cellular tissue based products placed in the center since last visit: Has Dressing in Place as Prescribed: Yes Pain Present Now: No Electronic Signature(s) Signed: 05/04/2022 1:14:49 PM By: Carlene Coria RN Entered By: Carlene Coria on 05/03/2022 12:55:51 -------------------------------------------------------------------------------- Clinic Level of Care Assessment Details Patient Name: Date of Service: [name] 05/03/2022 12:30 PM Medical Record Number: [id number] Patient Account Number: [id number] Date of Birth/Sex: Treating RN: 06-Nov-1955 (66 y.o. [name]) Carlene Coria Primary Care Lyndzie Zentz: Leward Quan Other Clinician: Referring Margurette Brener: Treating Syrina Wake/Extender: Durwin Glaze in  Treatment: 19 Clinic Level of Care Assessment Items TOOL 4 Quantity Score X- 1 0 Use when only an EandM is performed on FOLLOW-UP visit ASSESSMENTS - Nursing Assessment / Reassessment X- 1 10 Reassessment of Co-morbidities (includes updates in patient status) X- 1 5 Reassessment of Adherence to Treatment Plan [facility], [name] ([id number]) [id number] Page 2 of 9 ASSESSMENTS - Wound and Skin A ssessment / Reassessment X - Simple Wound Assessment / Reassessment - one wound 1 5 _0  - 0 Complex Wound Assessment / Reassessment - multiple wounds _1  - 0 Dermatologic / Skin Assessment (not related to wound area) ASSESSMENTS - Focused Assessment _2  - 0 Circumferential Edema Measurements - multi extremities _3  - 0 Nutritional Assessment / Counseling / Intervention _4  - 0 Lower Extremity Assessment (monofilament, tuning fork, pulses) _5  - 0 Peripheral Arterial Disease Assessment (using hand held doppler) ASSESSMENTS - Ostomy and/or Continence Assessment and Care _6  - 0 Incontinence Assessment and Management _7  - 0 Ostomy Care Assessment and Management (repouching, etc.) PROCESS - Coordination of Care X - Simple Patient / Family Education for ongoing care 1 15 _8  - 0 Complex (extensive) Patient / Family Education for ongoing care _9  - 0 Staff obtains Programmer, systems, Records, T Results / Process Orders est _10  - 0 Staff telephones HHA, Nursing Homes / Clarify orders / etc _11  - 0 Routine Transfer to another Facility (non-emergent condition) _12  - 0 Routine Hospital Admission (non-emergent condition) _13  - 0 New Admissions / Biomedical engineer / Ordering NPWT Apligraf, etc. , _14  - 0 Emergency Hospital Admission (emergent condition) X- 1 10 Simple Discharge Coordination _15  - 0 Complex (extensive) Discharge Coordination PROCESS - Special Needs _16  - 0 Pediatric / Minor Patient Management _17  - 0 Isolation Patient Management _18  - 0 Hearing / Language / Visual  special needs _19  - 0 Assessment of Community assistance (transportation, D/C planning, etc.) _20  - 0 Additional assistance / Altered mentation _21  - 0 Support Surface(s) Assessment (bed, cushion,  seat, etc.) INTERVENTIONS - Wound Cleansing / Measurement X - Simple Wound Cleansing - one wound 1 5 _0  - 0 Complex Wound Cleansing - multiple wounds X- 1 5 Wound Imaging (photographs - any number of wounds) _1  - 0 Wound Tracing (instead of photographs) X- 1 5 Simple Wound Measurement - one wound _2  - 0 Complex Wound Measurement - multiple wounds INTERVENTIONS - Wound Dressings X - Small Wound Dressing one or multiple wounds 1 10 _3  - 0 Medium Wound Dressing one or multiple wounds _4  - 0 Large Wound Dressing one or multiple wounds <SJGGEZMOQHUTMLYY>_5<\/KPTWSFKCLEXNTZGY>_1  - 0 Application of Medications - topical <VCBSWHQPRFFMBWGY>_6<\/ZLDJTTSVXBLTJQZE>_0  - 0 Application of Medications - injection INTERVENTIONS - Miscellaneous _7  - 0 External ear exam [facility], [name] ([id number]) [id number] Page 3 of 9 _8  - 0 Specimen Collection (cultures, biopsies, blood, body fluids, etc.) _9  - 0 Specimen(s) / Culture(s) sent or taken to Lab for analysis _10  - 0 Patient Transfer (multiple staff / [name] Lift / Similar devices) _11  - 0 Simple Staple / Suture removal (25 or less) _12  - 0 Complex Staple / Suture removal (26 or more) _13  - 0 Hypo / Hyperglycemic Management (close monitor of Blood Glucose) _14  - 0 Ankle / Brachial Index (ABI) - do not check if billed separately X- 1 5 Vital Signs Has the patient been seen at the hospital within the last three years: Yes Total Score: 75 Level Of Care: New/Established - Level 2 Electronic Signature(s) Signed: 05/04/2022 1:14:49 PM By: Carlene Coria RN Entered By: Carlene Coria on 05/03/2022 13:14:15 -------------------------------------------------------------------------------- Encounter Discharge Information Details Patient Name: Date of Service: ENO [place], DO UGLA S 05/03/2022 12:30 PM Medical Record  Number: [id number] Patient Account Number: [id number] Date of Birth/Sex: Treating RN: 1955-12-11 (66 y.o. [name]) Carlene Coria Primary Care Juanmanuel Marohl: Leward Quan Other Clinician: Referring Talulah Schirmer: Treating Melton Walls/Extender: Durwin Glaze in Treatment: 19 Encounter Discharge Information Items Discharge Condition: Stable Ambulatory Status: Wheelchair Discharge Destination: Home Transportation: Private Auto Accompanied By: sister Schedule Follow-up Appointment: Yes Clinical Summary of Care: Electronic Signature(s) Signed: 05/04/2022 1:14:49 PM By: Carlene Coria RN Entered By: Carlene Coria on 05/03/2022 13:15:02 Lower Extremity Assessment Details -------------------------------------------------------------------------------- [name] ([id number]) [id number] Page 4 of 9 Patient Name: Date of Service: [name] 05/03/2022 12:30 PM Medical Record Number: [id number] Patient Account Number: [id number] Date of Birth/Sex: Treating RN: [birth year] (66 y.o. [name]) Carlene Coria Primary Care Drema Eddington: Leward Quan Other Clinician: Referring Eshani Maestre: Treating Santoria Chason/Extender: Durwin Glaze in Treatment: 19 Electronic Signature(s) Signed: 05/04/2022 1:14:49 PM By: Carlene Coria RN Entered By: Carlene Coria on 05/03/2022 12:57:27 -------------------------------------------------------------------------------- Multi Wound Chart Details Patient Name: Date of Service: ENO [place], DO UGLA S 05/03/2022 12:30 PM Medical Record Number: [id number] Patient Account Number: [id number] Date of Birth/Sex: Treating RN: 10-09-1955 (66 y.o. [name]) Carlene Coria Primary Care Jerzee Jerome: Leward Quan Other Clinician: Referring Joy Reiger: Treating Aldine Grainger/Extender: Durwin Glaze in Treatment: 19 Vital Signs Height(in): 76 Pulse(bpm): 71 Weight(lbs): 167 Blood Pressure(mmHg): 145/85 Body Mass Index(BMI): 20.3 Temperature(F): 98.3 Respiratory  Rate(breaths/min): 18 [1:Photos:] [N/A:N/A] Midline Sacrum N/A N/A Wound Location: Pressure Injury N/A N/A Wounding Event: Pressure Ulcer N/A N/A Primary Etiology: Arrhythmia, Hypotension, History of N/A N/A Comorbid History: pressure wounds, Quadriplegia 10/18/2021 N/A N/A Date Acquired: 34 N/A N/A Weeks of Treatment: Open N/A N/A Wound Status: No N/A N/A Wound Recurrence: 6x3x1.2 N/A N/A Measurements L x W x D (cm) 14.137 N/A N/A A (cm) : rea 16.965 N/A N/A Volume (cm) : -512.30% N/A  N/A % Reduction in A rea: -3572.10% N/A N/A % Reduction in Volume: Category/Stage III N/A N/A Classification: Medium N/A N/A Exudate A mount: Serous N/A N/A Exudate Type: amber N/A N/A Exudate Color: Epibole N/A N/A Wound Margin: Large (67-100%) N/A N/A Granulation A mount: Red N/A N/A Granulation Quality: Small (1-33%) N/A N/A Necrotic A mount: Fat Layer (Subcutaneous Tissue): Yes N/A N/A Exposed Structures: Fascia: No Tendon: No [place], [name] ([id number]) [id number] Page 5 of 9 Muscle: No Joint: No Bone: No Small (1-33%) N/A N/A Epithelialization: Treatment Notes Electronic Signature(s) Signed: 05/04/2022 1:14:49 PM By: Carlene Coria RN Entered By: Carlene Coria on 05/03/2022 12:57:43 -------------------------------------------------------------------------------- Multi-Disciplinary Care Plan Details Patient Name: Date of Service: ENO [place], DO UGLA S 05/03/2022 12:30 PM Medical Record Number: [id number] Patient Account Number: [id number] Date of Birth/Sex: Treating RN: 04/10/1956 (66 y.o. [name]) Carlene Coria Primary Care Sharrod Achille: Leward Quan Other Clinician: Referring Sydna Brodowski: Treating Delayla Hoffmaster/Extender: Durwin Glaze in Treatment: 19 Active Inactive Pressure Nursing Diagnoses: Knowledge deficit related to management of pressures ulcers Goals: Patient will remain free from development of additional pressure ulcers Date  Initiated: 12/18/2021 Target Resolution Date: 05/19/2022 Goal Status: Active Patient will remain free of pressure ulcers Date Initiated: 12/18/2021 Target Resolution Date: 05/19/2022 Goal Status: Active Patient/caregiver will verbalize risk factors for pressure ulcer development Date Initiated: 12/18/2021 Target Resolution Date: 06/16/2022 Goal Status: Active Patient/caregiver will verbalize understanding of pressure ulcer management Date Initiated: 12/18/2021 Target Resolution Date: 05/19/2022 Goal Status: Active Interventions: Assess: immobility, friction, shearing, incontinence upon admission and as needed Assess offloading mechanisms upon admission and as needed Assess potential for pressure ulcer upon admission and as needed Provide education on pressure ulcers Treatment Activities: Patient referred for seating evaluation to ensure proper offloading : 12/18/2021 Notes: Wound/Skin Impairment Nursing Diagnoses: Impaired tissue integrity Knowledge deficit related to smoking impact on wound healing Knowledge deficit related to ulceration/compromised skin integrity Paynter, [name] ([id number]) [id number] Page 6 of 9 Goals: Patient/caregiver will verbalize understanding of skin care regimen Date Initiated: 12/18/2021 Date Inactivated: 01/25/2022 Target Resolution Date: 12/18/2021 Goal Status: Met Ulcer/skin breakdown will have a volume reduction of 30% by week 4 Date Initiated: 12/18/2021 Target Resolution Date: 05/19/2022 Goal Status: Active Interventions: Assess ulceration(s) every visit Treatment Activities: Referred to DME Kenderick Kobler for dressing supplies : 12/18/2021 Skin care regimen initiated : 12/18/2021 Topical wound management initiated : 12/18/2021 Notes: Electronic Signature(s) Signed: 05/04/2022 1:14:49 PM By: Carlene Coria RN Entered By: Carlene Coria on 05/03/2022 12:57:36 -------------------------------------------------------------------------------- Pain  Assessment Details Patient Name: Date of Service: [name] 05/03/2022 12:30 PM Medical Record Number: [id number] Patient Account Number: [id number] Date of Birth/Sex: Treating RN: [birth year] (66 y.o. [name]) Carlene Coria Primary Care Cynthis Purington: Leward Quan Other Clinician: Referring Treasure Ochs: Treating Tonimarie Gritz/Extender: Durwin Glaze in Treatment: 19 Active Problems Location of Pain Severity and Description of Pain Patient Has Paino No Site Locations Pain Management and Medication Current Pain Management: Electronic Signature(s) Signed: 05/04/2022 1:14:49 PM By: Carlene Coria RN Boykin Reaper, [place] ([id number]) [id number] Page 7 of 9 Entered By: Carlene Coria on 05/03/2022 12:56:28 -------------------------------------------------------------------------------- Patient/Caregiver Education Details Patient Name: Date of Service: [name] 11/16/2023andnbsp12:30 PM Medical Record Number: [id number] Patient Account Number: [id number] Date of Birth/Gender: Treating RN: [birth year] (66 y.o. [name]) Carlene Coria Primary Care Physician: Leward Quan Other Clinician: Referring Physician: Treating Physician/Extender: Durwin Glaze in Treatment: 61 Education Assessment Education Provided To: Patient Education Topics Provided Pressure: Methods: Explain/Verbal Responses: State content correctly [employer]) Signed:  05/04/2022 1:14:49 PM By: Carlene Coria RN Entered By: Carlene Coria on 05/03/2022 13:14:30 -------------------------------------------------------------------------------- Wound Assessment Details Patient Name: Date of Service: ENO [facility], DO [name] 05/03/2022 12:30 PM Medical Record Number: [id number] Patient Account Number: [id number] Date of Birth/Sex: Treating RN: July 26, 1955 (66 y.o. [name]) Carlene Coria Primary Care Minor Iden: Leward Quan Other Clinician: Referring Montoya Watkin: Treating Keelin Neville/Extender: Durwin Glaze in Treatment: 19 Wound Status Wound Number: 1 Primary Pressure Ulcer Etiology: Wound Location: Midline Sacrum Wound Status: Open Wounding Event: Pressure Injury Comorbid Arrhythmia, Hypotension, History of pressure wounds, Date Acquired: 10/18/2021 History: Quadriplegia Weeks Of Treatment: 19 Clustered Wound: No Photos Donora, [name] ([id number]) [id number] Page 8 of 9 Wound Measurements Length: (cm) 6 Width: (cm) 3 Depth: (cm) 1.2 Area: (cm) 14.137 Volume: (cm) 16.965 % Reduction in Area: -512.3% % Reduction in Volume: -3572.1% Epithelialization: Small (1-33%) Tunneling: No Undermining: No Wound Description Classification: Category/Stage III Wound Margin: Epibole Exudate Amount: Medium Exudate Type: Serous Exudate Color: amber Foul Odor After Cleansing: No Slough/Fibrino Yes Wound Bed Granulation Amount: Large (67-100%) Exposed Structure Granulation Quality: Red Fascia Exposed: No Necrotic Amount: Small (1-33%) Fat Layer (Subcutaneous Tissue) Exposed: Yes Necrotic Quality: Adherent Slough Tendon Exposed: No Muscle Exposed: No Joint Exposed: No Bone Exposed: No Treatment Notes Wound #1 (Sacrum) Wound Laterality: Midline Cleanser Soap and Water Discharge Instruction: Gently cleanse wound with antibacterial soap, rinse and pat dry prior to dressing wounds Peri-Wound Care Topical Primary Dressing Hydrofera Blue Ready Transfer Foam, 4x5 (in/in) Discharge Instruction: Apply Hydrofera Blue Ready to wound bed as directed Secondary Dressing (BORDER) Zetuvit Plus SILICONE BORDER Dressing 4x4 (in/in) Discharge Instruction: Please do not put silicone bordered dressings under wraps. Use non-bordered dressing only. When applying please make sure skin is pulled flat so dressing stays in place. Secured With Compression Wrap Compression Stockings Environmental education officer) Signed: 05/04/2022 1:14:49 PM By: Carlene Coria RN Entered By: Carlene Coria on 05/03/2022 12:57:17 Stringfellow, Breylin ([id number]) [id number] Page 9 of 9 -------------------------------------------------------------------------------- Vitals Details Patient Name: Date of Service: ENO [facility] 05/03/2022 12:30 PM Medical Record Number: [id number] Patient Account Number: [id number] Date of Birth/Sex: Treating RN: [birth year] (66 y.o. [name]) Carlene Coria Primary Care Ramere Downs: Leward Quan Other Clinician: Referring Radford Pease: Treating Nikolaos Maddocks/Extender: Durwin Glaze in Treatment: 19 Vital Signs Time Taken: 12:55 Temperature (F): 98.3 Height (in): 76 Pulse (bpm): 71 Weight (lbs): 167 Respiratory Rate (breaths/min): 18 Body Mass Index (BMI): 20.3 Blood Pressure (mmHg): 145/85 Reference Range: 80 - 120 mg / dl Electronic Signature(s) Signed: 05/04/2022 1:14:49 PM By: Carlene Coria RN Entered By: Carlene Coria on 05/03/2022 12:56:22

## 2022-05-07 ENCOUNTER — Other Ambulatory Visit: Payer: Self-pay

## 2022-05-07 ENCOUNTER — Ambulatory Visit: Payer: Medicare (Managed Care)

## 2022-05-07 ENCOUNTER — Ambulatory Visit
Admission: RE | Admit: 2022-05-07 | Discharge: 2022-05-07 | Disposition: A | Discharge status: Home / Self Care | Payer: Medicare (Managed Care) | Source: Ambulatory Visit | Attending: Radiation Oncology | Admitting: Radiation Oncology

## 2022-05-07 DIAGNOSIS — C61 Malignant neoplasm of prostate: Secondary | ICD-10-CM | POA: Diagnosis not present

## 2022-05-07 LAB — RAD ONC ARIA SESSION SUMMARY
Course Elapsed Days: 14
Plan Fractions Treated to Date: 9
Plan Prescribed Dose Per Fraction: 2.5 Gy
Plan Total Fractions Prescribed: 28
Plan Total Prescribed Dose: 70 Gy
Reference Point Dosage Given to Date: 22.5 Gy
Reference Point Session Dosage Given: 2.5 Gy
Session Number: 9

## 2022-05-08 ENCOUNTER — Other Ambulatory Visit: Payer: Self-pay

## 2022-05-08 ENCOUNTER — Ambulatory Visit: Payer: Medicare (Managed Care)

## 2022-05-08 ENCOUNTER — Other Ambulatory Visit: Payer: Self-pay | Admitting: *Deleted

## 2022-05-08 ENCOUNTER — Ambulatory Visit
Admission: RE | Admit: 2022-05-08 | Discharge: 2022-05-08 | Disposition: A | Discharge status: Home / Self Care | Payer: Medicare (Managed Care) | Source: Ambulatory Visit | Attending: Radiation Oncology | Admitting: Radiation Oncology

## 2022-05-08 DIAGNOSIS — C61 Malignant neoplasm of prostate: Secondary | ICD-10-CM | POA: Diagnosis not present

## 2022-05-08 LAB — RAD ONC ARIA SESSION SUMMARY
Course Elapsed Days: 15
Plan Fractions Treated to Date: 10
Plan Prescribed Dose Per Fraction: 2.5 Gy
Plan Total Fractions Prescribed: 28
Plan Total Prescribed Dose: 70 Gy
Reference Point Dosage Given to Date: 25 Gy
Reference Point Session Dosage Given: 2.5 Gy
Session Number: 10

## 2022-05-08 MED ORDER — TAMSULOSIN HCL 0.4 MG PO CAPS: 0.4000 mg | ORAL_CAPSULE | Freq: Every day | ORAL | 2 refills | Status: AC

## 2022-05-09 ENCOUNTER — Ambulatory Visit
Admission: RE | Admit: 2022-05-09 | Discharge: 2022-05-09 | Disposition: A | Discharge status: Home / Self Care | Payer: Medicare (Managed Care) | Source: Ambulatory Visit | Attending: Radiation Oncology | Admitting: Radiation Oncology

## 2022-05-09 ENCOUNTER — Ambulatory Visit: Payer: Medicare (Managed Care)

## 2022-05-09 ENCOUNTER — Other Ambulatory Visit: Payer: Self-pay

## 2022-05-09 DIAGNOSIS — C61 Malignant neoplasm of prostate: Secondary | ICD-10-CM | POA: Diagnosis not present

## 2022-05-09 LAB — RAD ONC ARIA SESSION SUMMARY
Course Elapsed Days: 16
Plan Fractions Treated to Date: 11
Plan Prescribed Dose Per Fraction: 2.5 Gy
Plan Total Fractions Prescribed: 28
Plan Total Prescribed Dose: 70 Gy
Reference Point Dosage Given to Date: 27.5 Gy
Reference Point Session Dosage Given: 2.5 Gy
Session Number: 11

## 2022-05-14 ENCOUNTER — Ambulatory Visit
Admission: RE | Admit: 2022-05-14 | Discharge: 2022-05-14 | Disposition: A | Discharge status: Home / Self Care | Payer: Medicare (Managed Care) | Source: Ambulatory Visit | Attending: Radiation Oncology | Admitting: Radiation Oncology

## 2022-05-14 ENCOUNTER — Other Ambulatory Visit: Payer: Self-pay

## 2022-05-14 ENCOUNTER — Ambulatory Visit: Payer: Medicare (Managed Care)

## 2022-05-14 DIAGNOSIS — C61 Malignant neoplasm of prostate: Secondary | ICD-10-CM | POA: Diagnosis not present

## 2022-05-14 LAB — RAD ONC ARIA SESSION SUMMARY
Course Elapsed Days: 21
Plan Fractions Treated to Date: 12
Plan Prescribed Dose Per Fraction: 2.5 Gy
Plan Total Fractions Prescribed: 28
Plan Total Prescribed Dose: 70 Gy
Reference Point Dosage Given to Date: 30 Gy
Reference Point Session Dosage Given: 2.5 Gy
Session Number: 12

## 2022-05-15 ENCOUNTER — Ambulatory Visit
Admission: RE | Admit: 2022-05-15 | Discharge: 2022-05-15 | Disposition: A | Discharge status: Home / Self Care | Payer: Medicare (Managed Care) | Source: Ambulatory Visit | Attending: Radiation Oncology | Admitting: Radiation Oncology

## 2022-05-15 ENCOUNTER — Ambulatory Visit: Payer: Medicare (Managed Care)

## 2022-05-15 ENCOUNTER — Other Ambulatory Visit: Payer: Self-pay

## 2022-05-15 DIAGNOSIS — C61 Malignant neoplasm of prostate: Secondary | ICD-10-CM | POA: Diagnosis not present

## 2022-05-15 LAB — RAD ONC ARIA SESSION SUMMARY
Course Elapsed Days: 22
Plan Fractions Treated to Date: 13
Plan Prescribed Dose Per Fraction: 2.5 Gy
Plan Total Fractions Prescribed: 28
Plan Total Prescribed Dose: 70 Gy
Reference Point Dosage Given to Date: 32.5 Gy
Reference Point Session Dosage Given: 2.5 Gy
Session Number: 13

## 2022-05-16 ENCOUNTER — Inpatient Hospital Stay: Payer: Medicare (Managed Care)

## 2022-05-16 ENCOUNTER — Other Ambulatory Visit: Payer: Self-pay

## 2022-05-16 ENCOUNTER — Ambulatory Visit
Admission: RE | Admit: 2022-05-16 | Discharge: 2022-05-16 | Disposition: A | Discharge status: Home / Self Care | Payer: Medicare (Managed Care) | Source: Ambulatory Visit | Attending: Radiation Oncology | Admitting: Radiation Oncology

## 2022-05-16 ENCOUNTER — Ambulatory Visit: Payer: Medicare (Managed Care)

## 2022-05-16 DIAGNOSIS — C61 Malignant neoplasm of prostate: Secondary | ICD-10-CM | POA: Diagnosis not present

## 2022-05-16 LAB — RAD ONC ARIA SESSION SUMMARY
Course Elapsed Days: 23
Plan Fractions Treated to Date: 14
Plan Prescribed Dose Per Fraction: 2.5 Gy
Plan Total Fractions Prescribed: 28
Plan Total Prescribed Dose: 70 Gy
Reference Point Dosage Given to Date: 35 Gy
Reference Point Session Dosage Given: 2.5 Gy
Session Number: 14

## 2022-05-16 LAB — CBC
HCT: 46 % (ref 39.0–52.0)
Hemoglobin: 14.7 g/dL (ref 13.0–17.0)
MCH: 27.4 pg (ref 26.0–34.0)
MCHC: 32 g/dL (ref 30.0–36.0)
MCV: 85.8 fL (ref 80.0–100.0)
Platelets: 188 10*3/uL (ref 150–400)
RBC: 5.36 MIL/uL (ref 4.22–5.81)
RDW: 13 % (ref 11.5–15.5)
WBC: 4.8 10*3/uL (ref 4.0–10.5)
nRBC: 0 % (ref 0.0–0.2)

## 2022-05-17 ENCOUNTER — Ambulatory Visit
Admission: RE | Admit: 2022-05-17 | Discharge: 2022-05-17 | Disposition: A | Discharge status: Home / Self Care | Payer: Medicare (Managed Care) | Source: Ambulatory Visit | Attending: Radiation Oncology | Admitting: Radiation Oncology

## 2022-05-17 ENCOUNTER — Other Ambulatory Visit: Payer: Self-pay

## 2022-05-17 ENCOUNTER — Ambulatory Visit: Payer: Medicare (Managed Care) | Admitting: Physician Assistant

## 2022-05-17 ENCOUNTER — Ambulatory Visit: Payer: Medicare (Managed Care)

## 2022-05-17 DIAGNOSIS — C61 Malignant neoplasm of prostate: Secondary | ICD-10-CM | POA: Diagnosis not present

## 2022-05-17 LAB — RAD ONC ARIA SESSION SUMMARY
Course Elapsed Days: 24
Plan Fractions Treated to Date: 15
Plan Prescribed Dose Per Fraction: 2.5 Gy
Plan Total Fractions Prescribed: 28
Plan Total Prescribed Dose: 70 Gy
Reference Point Dosage Given to Date: 37.5 Gy
Reference Point Session Dosage Given: 2.5 Gy
Session Number: 15

## 2022-05-18 ENCOUNTER — Other Ambulatory Visit: Payer: Self-pay

## 2022-05-18 ENCOUNTER — Ambulatory Visit: Payer: Medicare (Managed Care)

## 2022-05-18 ENCOUNTER — Ambulatory Visit
Admission: RE | Admit: 2022-05-18 | Discharge: 2022-05-18 | Disposition: A | Discharge status: Home / Self Care | Payer: Medicare (Managed Care) | Source: Ambulatory Visit | Attending: Radiation Oncology | Admitting: Radiation Oncology

## 2022-05-18 ENCOUNTER — Encounter: Payer: Self-pay | Admitting: Urology

## 2022-05-18 DIAGNOSIS — C61 Malignant neoplasm of prostate: Secondary | ICD-10-CM | POA: Diagnosis not present

## 2022-05-18 LAB — RAD ONC ARIA SESSION SUMMARY
Course Elapsed Days: 25
Plan Fractions Treated to Date: 16
Plan Prescribed Dose Per Fraction: 2.5 Gy
Plan Total Fractions Prescribed: 28
Plan Total Prescribed Dose: 70 Gy
Reference Point Dosage Given to Date: 40 Gy
Reference Point Session Dosage Given: 2.5 Gy
Session Number: 16

## 2022-05-21 ENCOUNTER — Ambulatory Visit: Payer: Medicare (Managed Care)

## 2022-05-21 ENCOUNTER — Other Ambulatory Visit: Payer: Self-pay

## 2022-05-21 ENCOUNTER — Ambulatory Visit
Admission: RE | Admit: 2022-05-21 | Discharge: 2022-05-21 | Disposition: A | Discharge status: Home / Self Care | Payer: Medicare (Managed Care) | Source: Ambulatory Visit | Attending: Radiation Oncology | Admitting: Radiation Oncology

## 2022-05-21 DIAGNOSIS — C61 Malignant neoplasm of prostate: Secondary | ICD-10-CM | POA: Diagnosis not present

## 2022-05-21 LAB — RAD ONC ARIA SESSION SUMMARY
Course Elapsed Days: 28
Plan Fractions Treated to Date: 17
Plan Prescribed Dose Per Fraction: 2.5 Gy
Plan Total Fractions Prescribed: 28
Plan Total Prescribed Dose: 70 Gy
Reference Point Dosage Given to Date: 42.5 Gy
Reference Point Session Dosage Given: 2.5 Gy
Session Number: 17

## 2022-05-22 ENCOUNTER — Other Ambulatory Visit: Payer: Self-pay

## 2022-05-22 ENCOUNTER — Ambulatory Visit: Payer: Medicare (Managed Care)

## 2022-05-22 ENCOUNTER — Ambulatory Visit
Admission: RE | Admit: 2022-05-22 | Discharge: 2022-05-22 | Disposition: A | Discharge status: Home / Self Care | Payer: Medicare (Managed Care) | Source: Ambulatory Visit | Attending: Radiation Oncology | Admitting: Radiation Oncology

## 2022-05-22 DIAGNOSIS — C61 Malignant neoplasm of prostate: Secondary | ICD-10-CM | POA: Diagnosis not present

## 2022-05-22 LAB — RAD ONC ARIA SESSION SUMMARY
Course Elapsed Days: 29
Plan Fractions Treated to Date: 18
Plan Prescribed Dose Per Fraction: 2.5 Gy
Plan Total Fractions Prescribed: 28
Plan Total Prescribed Dose: 70 Gy
Reference Point Dosage Given to Date: 45 Gy
Reference Point Session Dosage Given: 2.5 Gy
Session Number: 18

## 2022-05-23 ENCOUNTER — Ambulatory Visit
Admission: RE | Admit: 2022-05-23 | Discharge: 2022-05-23 | Disposition: A | Discharge status: Home / Self Care | Payer: Medicare (Managed Care) | Source: Ambulatory Visit | Attending: Radiation Oncology | Admitting: Radiation Oncology

## 2022-05-23 ENCOUNTER — Ambulatory Visit: Payer: Medicare (Managed Care)

## 2022-05-23 ENCOUNTER — Other Ambulatory Visit: Payer: Self-pay

## 2022-05-23 DIAGNOSIS — C61 Malignant neoplasm of prostate: Secondary | ICD-10-CM | POA: Diagnosis not present

## 2022-05-23 LAB — RAD ONC ARIA SESSION SUMMARY
Course Elapsed Days: 30
Plan Fractions Treated to Date: 19
Plan Prescribed Dose Per Fraction: 2.5 Gy
Plan Total Fractions Prescribed: 28
Plan Total Prescribed Dose: 70 Gy
Reference Point Dosage Given to Date: 47.5 Gy
Reference Point Session Dosage Given: 2.5 Gy
Session Number: 19

## 2022-05-24 ENCOUNTER — Ambulatory Visit
Admission: RE | Admit: 2022-05-24 | Discharge: 2022-05-24 | Disposition: A | Discharge status: Home / Self Care | Payer: Medicare (Managed Care) | Source: Ambulatory Visit | Attending: Radiation Oncology | Admitting: Radiation Oncology

## 2022-05-24 ENCOUNTER — Ambulatory Visit: Payer: Medicare (Managed Care)

## 2022-05-24 ENCOUNTER — Other Ambulatory Visit: Payer: Self-pay

## 2022-05-24 ENCOUNTER — Encounter: Payer: Medicare (Managed Care) | Attending: Physician Assistant | Admitting: Physician Assistant

## 2022-05-24 DIAGNOSIS — I480 Paroxysmal atrial fibrillation: Secondary | ICD-10-CM | POA: Insufficient documentation

## 2022-05-24 DIAGNOSIS — L89153 Pressure ulcer of sacral region, stage 3: Secondary | ICD-10-CM | POA: Insufficient documentation

## 2022-05-24 DIAGNOSIS — G8254 Quadriplegia, C5-C7 incomplete: Secondary | ICD-10-CM | POA: Diagnosis not present

## 2022-05-24 DIAGNOSIS — C61 Malignant neoplasm of prostate: Secondary | ICD-10-CM | POA: Diagnosis not present

## 2022-05-24 LAB — RAD ONC ARIA SESSION SUMMARY
Course Elapsed Days: 31
Plan Fractions Treated to Date: 20
Plan Prescribed Dose Per Fraction: 2.5 Gy
Plan Total Fractions Prescribed: 28
Plan Total Prescribed Dose: 70 Gy
Reference Point Dosage Given to Date: 50 Gy
Reference Point Session Dosage Given: 2.5 Gy
Session Number: 20

## 2022-05-25 ENCOUNTER — Other Ambulatory Visit: Payer: Self-pay

## 2022-05-25 ENCOUNTER — Ambulatory Visit: Payer: Medicare (Managed Care)

## 2022-05-25 ENCOUNTER — Ambulatory Visit
Admission: RE | Admit: 2022-05-25 | Discharge: 2022-05-25 | Disposition: A | Discharge status: Home / Self Care | Payer: Medicare (Managed Care) | Source: Ambulatory Visit | Attending: Radiation Oncology | Admitting: Radiation Oncology

## 2022-05-25 DIAGNOSIS — C61 Malignant neoplasm of prostate: Secondary | ICD-10-CM | POA: Diagnosis not present

## 2022-05-25 LAB — RAD ONC ARIA SESSION SUMMARY
Course Elapsed Days: 32
Plan Fractions Treated to Date: 21
Plan Prescribed Dose Per Fraction: 2.5 Gy
Plan Total Fractions Prescribed: 28
Plan Total Prescribed Dose: 70 Gy
Reference Point Dosage Given to Date: 52.5 Gy
Reference Point Session Dosage Given: 2.5 Gy
Session Number: 21

## 2022-05-26 NOTE — Progress Notes (Signed)
[place], [name] ([id number]) [id number] Page 1 of 7 Visit Report for 05/24/2022 Chief Complaint Document Details Patient Name: Date of Service: [name] 05/24/2022 11:30 A M Medical Record Number: [id number] Patient Account Number: [id number] Date of Birth/Sex: Treating RN: August 23, 1955 (66 y.o. [name] Primary Care Provider: Leward Quan Other Clinician: Referring Provider: Treating Provider/Extender: Durwin Glaze in Treatment: 22 Information Obtained from: Patient Chief Complaint Sacral pressure ulcer Electronic Signature([name]) Signed: 05/24/2022 1:04:33 PM By: Worthy Keeler PA-C Entered By: Worthy Keeler on 05/24/2022 13:04:33 -------------------------------------------------------------------------------- HPI Details Patient Name: Date of Service: Michael [place], Michael [name] 05/24/2022 11:30 A M Medical Record Number: [id number] Patient Account Number: [id number] Date of Birth/Sex: Treating RN: [birth year] (66 y.o. [name] Primary Care Provider: Leward Quan Other Clinician: Referring Provider: Treating Provider/Extender: Durwin Glaze in Treatment: 22 History of Present Illness HPI Description: 12-18-2021 upon evaluation today patient presents for initial evaluation here in the clinic concerning an issue he has been having over the sacral area where he has a wound this is a stage III pressure ulcer. It also appears there may still be some moisture component to this based on what I am seeing. I Michael not see any evidence of infection but at the same time I Michael believe that the patient is in need of some additional and hopefully better dressings in order to help this heal more appropriately and quickly. Currently just a protective dressing has been used over this sounds like may possibly be a border foam. The patient did have a CT scan which was negative for osteomyelitis he had a previous wound above this in the sacral  region but not necessarily right of this area. 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 He also has atrial fibrillation and hypotension. 01-01-2022 upon evaluation today patient appears to be doing well with regard to his wound actually see some signs of improvement there is still quite a bit of feeling to be done here but nonetheless I think we are on the right track. I Michael not see any evidence of active infection locally or systemically at this time which is great news. 01-25-2022 upon evaluation today patient'[name] wound really is not significantly better it actually appears about the same. Of note I Michael believe that part of the issue here is probably the fact that honestly it is hard to keep the dressing in place I think that when the alginate is put on it needs to be cut bigger than what is currently being seen on the dressing today probably by about 3 times that size and placed directly to the wound bed while spreading the butt cheeks bilaterally. This will allow for the dressing to be right intact with the wound bed so that when it does try to closing on itself it will actually fold on the alginate and not [facility], [name] ([id number]) [id number] Page 2 of 7 actually contact skin to skin. 02-09-2022 upon evaluation today patient appears to be doing about the same in regard to his wound. Again I Michael not think the dressings being quite put on correctly keeping the alginate in contact with the wound bed this just does not seem to be quite doing the job. I think we may need to switch to Dakin'[name] moistened gauze dressing this is a very precarious location I am not even sure that he is not getting some feces into this dressing and underneath. I  think it is probably going to be changed daily and switch again to the Dakin'[name]. 02-23-2022 upon evaluation today patient unfortunately still continues to have issues with his sacral area. Again this is a very  precarious spot but honestly I think is made worse by the fact that he is up sitting the majority of the day. He typically gets up by 10 he tells me and does not go to bed until around 11 or 12 this is a good solid at least 12 to 14 hours that he is up day in and day out. With that being said I think that this is probably one of the main reasons why this wound is not healing the other being obviously moisture control I Michael think the surface of the wound looks better this week compared to previous with the Dakin'[name] but at the same time the wound itself does not appear to be any smaller. 03-12-2022 upon evaluation today patient appears to be doing poorly currently in regard to the wound and sacral region. Unfortunately this is not showing signs of improvement and he actually has a small area of deep tissue injury right in the middle right at the point where the bone of the coccyx is pushing onto the wound bed. Again I think that this is likely end up opening up into a much deeper wound that extends down to bone if he is unable to get off of this. So far he has not been able to really Michael this effectively tells me that he has been "trying" but only succeeding minimally. 03-27-2022 upon evaluation today patient appears to be doing a little better in regard to his wound compared to last time I saw him. At that point he started to have some breakdown of the central portion of the wound the good news is that has improved. 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 04-09-2022 upon evaluation today patient'[name] wound is actually showing signs of doing okay. Its not significantly smaller and is also not any larger nor showing signs of breaking down. He tells me he is still trying to stay off of this on the weekends. He also tells me during the week he is going to the pace program more frequently 3 times a  week at this point. With that being said overall I am pleased in that regard and I am happy that he is doing somewhat better. With that being said I explained to the patient as well that I Michael believe he still needs to be offloading is much as he can. 05-03-2022 upon evaluation today patient appears to be doing well currently in regard to his wound from the standpoint of it not being infected I Michael not see anything obvious at this point which is great news. Fortunately I Michael not see any signs of active infection locally or systemically at this well which is also great news. Overall I Michael believe that the biggest issue simply is just that he is going to have a lot of trouble getting this to heal simply due to the fact that he has a lot of issues getting off of this routinely and even so it is a very difficult location even in the best of circumstances. Again its not deep enough for wound VAC right now. Hopefully will not get to that point. 05-24-2022 upon evaluation today patient actually does have  some signs of new skin growth around the edges of the wound. This was very subtle but nonetheless does seem to be doing better compared to what I previously seen. I think that the wound looks clean and does not appear to be trying to get any deeper which is good news. He does tell me that he is nearing the completion of his radiation therapy. That is for the prostate. Electronic Signature([name]) Signed: 05/24/2022 1:07:20 PM By: Worthy Keeler PA-C Entered By: Worthy Keeler on 05/24/2022 13:07:20 -------------------------------------------------------------------------------- Physical Exam Details Patient Name: Date of Service: [name], [name] 05/24/2022 11:30 A M Medical Record Number: [id number] Patient Account Number: [id number] Date of Birth/Sex: Treating RN: 03/31/1956 (66 y.o. [name] Primary Care Provider: Leward Quan Other Clinician: Referring Provider: Treating Provider/Extender: Durwin Glaze in Treatment: 66 Constitutional Well-nourished and well-hydrated in no acute distress. Respiratory normal breathing without difficulty. Psychiatric this patient is able to make decisions and demonstrates good insight into disease process. Alert and Oriented x 3. pleasant and cooperative. Notes Upon inspection patient'[name] wound bed actually showed signs of good granulation epithelization at this point. Fortunately I see no signs of infection at this time which is great news and overall I am extremely pleased with where things stand currently. Electronic Signature([name]) Signed: 05/24/2022 1:07:39 PM By: Worthy Keeler PA-C Entered By: Worthy Keeler on 05/24/2022 13:07:39 [place], [place] ([id number]) [id number] Page 3 of 7 -------------------------------------------------------------------------------- Physician Orders Details Patient Name: Date of Service: [name] 05/24/2022 11:30 A M Medical Record Number: [id number] Patient Account Number: [id number] Date of Birth/Sex: Treating RN: 11-29-1955 (66 y.o. [name] Primary Care Provider: Leward Quan Other Clinician: Referring Provider: Treating Provider/Extender: Durwin Glaze in Treatment: 22 Verbal / Phone Orders: No Diagnosis Coding Follow-up Appointments Wound #1 Midline Sacrum Return Appointment in 2 weeks. Home Health Wound #1 [place]: - [place] for wound care. May utilize formulary equivalent dressing for wound treatment orders unless otherwise specified. Home Health Nurse may visit PRN to address patients wound care needs. Scheduled days for dressing changes to be completed; exception, patient has scheduled wound care visit that day. **Please direct any NON-WOUND related issues/requests for orders to patient'[name] Primary Care Physician. **If current dressing causes regression in wound condition, may D/C  ordered dressing product/[name] and apply Normal Saline Moist Dressing daily until next [place] or Other MD appointment. **Notify W[facility]aling Center of regression in wound condition at [phone]. Bathing/ Shower/ Hygiene May shower; gently cleanse wound with antibacterial soap, rinse and pat dry prior to dressing wounds No tub bath. Off-Loading Roho cushion for wheelchair Turn and reposition every 2 hours Additional Orders / Instructions Follow Nutritious Diet and Increase Protein Intake Wound Treatment Wound #1 - Sacrum Wound Laterality: Midline Cleanser: Soap and Water 1 x Per Day/30 Days Discharge Instructions: Gently cleanse wound with antibacterial soap, rinse and pat dry prior to dressing wounds Prim Dressing: Hydrofera Blue Ready Transfer Foam, 4x5 (in/in) (Dispense As Written) 1 x Per Day/30 Days ary Discharge Instructions: Apply Hydrofera Blue Ready to wound bed as directed Secondary Dressing: (BORDER) Zetuvit Plus SILICONE BORDER Dressing 4x4 (in/in) 1 x Per Day/30 Days Discharge Instructions: Please Michael not put silicone bordered dressings under wraps. Use non-bordered dressing only. When applying please make sure skin is pulled flat so dressing stays in place. Electronic Signature([name]) Signed: 05/24/2022 4:35:46 PM By: Worthy Keeler PA-C Signed:  05/25/2022 1:42:10 PM By: Rosalio Loud MSN RN CNS WTA Entered By: Rosalio Loud on 05/24/2022 12:02:00 [name], [name] ([id number]) [id number] Page 4 of 7 -------------------------------------------------------------------------------- Problem List Details Patient Name: Date of Service: [name] 05/24/2022 11:30 A M Medical Record Number: [id number] Patient Account Number: [id number] Date of Birth/Sex: Treating RN: [birth year] (67 y.o. [name] Primary Care Provider: Leward Quan Other Clinician: Referring Provider: Treating Provider/Extender: Durwin Glaze in  Treatment: 22 Active Problems ICD-10 Encounter Code Description Active Date MDM Diagnosis L89.153 Pressure ulcer of sacral region, stage 3 12/18/2021 No Yes G82.54 Quadriplegia, C5-C7 incomplete 12/18/2021 No Yes I48.0 Paroxysmal atrial fibrillation 12/18/2021 No Yes I95.89 Other hypotension 12/18/2021 No Yes Inactive Problems Resolved Problems Electronic Signature([name]) Signed: 05/24/2022 1:04:30 PM By: Worthy Keeler PA-C Entered By: Worthy Keeler on 05/24/2022 13:04:30 -------------------------------------------------------------------------------- Progress Note Details Patient Name: Date of Service: Michael [place], Michael [name] 05/24/2022 11:30 A M Medical Record Number: [id number] Patient Account Number: [id number] Date of Birth/Sex: Treating RN: 1955/07/06 (66 y.o. [name] Primary Care Provider: Leward Quan Other Clinician: Referring Provider: Treating Provider/Extender: Durwin Glaze in Treatment: [address] [place] ([id number]) [id number] Page 5 of 7 Subjective Chief Complaint Information obtained from Patient Sacral pressure ulcer History of Present Illness (HPI) 12-18-2021 upon evaluation today patient presents for initial evaluation here in the clinic concerning an issue he has been having over the sacral area where he has a wound this is a stage III pressure ulcer. It also appears there may still be some moisture component to this based on what I am seeing. I Michael not see any evidence of infection but at the same time I Michael believe that the patient is in need of some additional and hopefully better dressings in order to help this heal more appropriately and quickly. Currently just a protective dressing has been used over this sounds like may possibly be a border foam. The patient did have a CT scan which was negative for osteomyelitis he had a previous wound above this in the sacral region but not necessarily right of this area. 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 He also has atrial fibrillation and hypotension. 01-01-2022 upon evaluation today patient appears to be doing well with regard to his wound actually see some signs of improvement there is still quite a bit of feeling to be done here but nonetheless I think we are on the right track. I Michael not see any evidence of active infection locally or systemically at this time which is great news. 01-25-2022 upon evaluation today patient'[name] wound really is not significantly better it actually appears about the same. Of note I Michael believe that part of the issue here is probably the fact that honestly it is hard to keep the dressing in place I think that when the alginate is put on it needs to be cut bigger than what is currently being seen on the dressing today probably by about 3 times that size and placed directly to the wound bed while spreading the butt cheeks bilaterally. This will allow for the dressing to be right intact with the wound bed so that when it does try to closing on itself it will actually fold on the alginate and not actually contact skin to skin. 02-09-2022 upon evaluation today patient appears to be doing about the same in regard to his wound. Again I Michael not think the dressings being  quite put on correctly keeping the alginate in contact with the wound bed this just does not seem to be quite doing the job. I think we may need to switch to Dakin'[name] moistened gauze dressing this is a very precarious location I am not even sure that he is not getting some feces into this dressing and underneath. I think it is probably going to be changed daily and switch again to the Dakin'[name]. 02-23-2022 upon evaluation today patient unfortunately still continues to have issues with his sacral area. Again this is a very precarious spot but honestly I think is made worse by the fact that he is up sitting the majority of the day. He typically gets up by 10 he tells  me and does not go to bed until around 11 or 12 this is a good solid at least 12 to 14 hours that he is up day in and day out. With that being said I think that this is probably one of the main reasons why this wound is not healing the other being obviously moisture control I Michael think the surface of the wound looks better this week compared to previous with the Dakin'[name] but at the same time the wound itself does not appear to be any smaller. 03-12-2022 upon evaluation today patient appears to be doing poorly currently in regard to the wound and sacral region. Unfortunately this is not showing signs of improvement and he actually has a small area of deep tissue injury right in the middle right at the point where the bone of the coccyx is pushing onto the wound bed. Again I think that this is likely end up opening up into a much deeper wound that extends down to bone if he is unable to get off of this. So far he has not been able to really Michael this effectively tells me that he has been "trying" but only succeeding minimally. 03-27-2022 upon evaluation today patient appears to be doing a little better in regard to his wound compared to last time I saw him. At that point he started to have some breakdown of the central portion of the wound the good news is that has improved. 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 04-09-2022 upon evaluation today patient'[name] wound is actually showing signs of doing okay. Its not significantly smaller and is also not any larger nor showing signs of breaking down. He tells me he is still trying to stay off of this on the weekends. He also tells me during the week he is going to the pace program more frequently 3 times a week at this point. With that being said overall I am pleased in that regard and I am happy that he is doing somewhat better. With that being said I  explained to the patient as well that I Michael believe he still needs to be offloading is much as he can. 05-03-2022 upon evaluation today patient appears to be doing well currently in regard to his wound from the standpoint of it not being infected I Michael not see anything obvious at this point which is great news. Fortunately I Michael not see any signs of active infection locally or systemically at this well which is also great news. Overall I Michael believe that the biggest issue simply is just that he is going to have a lot of trouble getting  this to heal simply due to the fact that he has a lot of issues getting off of this routinely and even so it is a very difficult location even in the best of circumstances. Again its not deep enough for wound VAC right now. Hopefully will not get to that point. 05-24-2022 upon evaluation today patient actually does have some signs of new skin growth around the edges of the wound. This was very subtle but nonetheless does seem to be doing better compared to what I previously seen. I think that the wound looks clean and does not appear to be trying to get any deeper which is good news. He does tell me that he is nearing the completion of his radiation therapy. That is for the prostate. Objective Constitutional Well-nourished and well-hydrated in no acute distress. Vitals Time Taken: 11:52 AM, Height: 76 in, Weight: 167 lbs, BMI: 20.3, Temperature: 97.7 F, Pulse: 52 bpm, Respiratory Rate: 16 breaths/min, Blood Pressure: 87/66 mmHg. Respiratory normal breathing without difficulty. Psychiatric this patient is able to make decisions and demonstrates good insight into disease process. Alert and Oriented x 3. pleasant and cooperative. General Notes: Upon inspection patient'[name] wound bed actually showed signs of good granulation epithelization at this point. Fortunately I see no signs of infection at this time which is great news and overall I am extremely pleased with where  things stand currently. Integumentary (Hair, Skin) Wound #1 status is Open. Original cause of wound was Pressure Injury. The date acquired was: 10/18/2021. The wound has been in treatment 22 weeks. The [place], [place] ([id number]) [id number] Page 6 of 7 wound is located on the Midline Sacrum. The wound measures 5.5cm length x 2.5cm width x 1.7cm depth; 10.799cm^2 area and 18.359cm^3 volume. There is Fat Layer (Subcutaneous Tissue) exposed. There is a medium amount of serous drainage noted. The wound margin is epibole. There is large (67-100%) red granulation within the wound bed. There is a small (1-33%) amount of necrotic tissue within the wound bed including Adherent Slough. Assessment Active Problems ICD-10 Pressure ulcer of sacral region, stage 3 Quadriplegia, C5-C7 incomplete Paroxysmal atrial fibrillation Other hypotension Plan Follow-up Appointments: Wound #1 Midline Sacrum: Return Appointment in 2 weeks. Home Health: Wound #1 Midline Sacrum: [place]: - [place] for wound care. May utilize formulary equivalent dressing for wound treatment orders unless otherwise specified. Home Health Nurse may visit PRN to address patientoos wound care needs. Scheduled days for dressing changes to be completed; exception, patient has scheduled wound care visit that day. **Please direct any NON-WOUND related issues/requests for orders to patient'[name] Primary Care Physician. **If current dressing causes regression in wound condition, may D/C ordered dressing product/[name] and apply Normal Saline Moist Dressing daily until next [place] or Other MD appointment. **Notify Wound Healing Center of regression in wound condition at [phone]. Bathing/ Shower/ Hygiene: May shower; gently cleanse wound with antibacterial soap, rinse and pat dry prior to dressing wounds No tub bath. Off-Loading: Roho cushion for wheelchair Turn and reposition  every 2 hours Additional Orders / Instructions: Follow Nutritious Diet and Increase Protein Intake WOUND #1: - Sacrum Wound Laterality: Midline Cleanser: Soap and Water 1 x Per Day/30 Days Discharge Instructions: Gently cleanse wound with antibacterial soap, rinse and pat dry prior to dressing wounds Prim Dressing: Hydrofera Blue Ready Transfer Foam, 4x5 (in/in) (Dispense As Written) 1 x Per Day/30 Days ary Discharge Instructions: Apply Hydrofera Blue Ready to wound bed as directed Secondary Dressing: (BORDER) [place]  Dressing 4x4 (in/in) 1 x Per Day/30 Days Discharge Instructions: Please Michael not put silicone bordered dressings under wraps. Use non-bordered dressing only. When applying please make sure skin is pulled flat so dressing stays in place. 1. I am going to recommend at this point that we have the patient go ahead and initiate a continuation of treatment with the [facility] which I feel like is doing a pretty good job here. 2. I am also going to recommend that the patient continue to monitor for any signs of infection or worsening. Obviously if anything changes he will contact the office and let me know. We will see patient back for reevaluation in 1 week here in the clinic. If anything worsens or changes patient will contact our office for additional recommendations. Electronic Signature([name]) Signed: 05/24/2022 1:08:28 PM By: Worthy Keeler PA-C Entered By: Worthy Keeler on 05/24/2022 13:08:28 SuperBill Details -------------------------------------------------------------------------------- [name] ([id number]) [id number] Page 7 of 7 Patient Name: Date of Service: [name] 05/24/2022 Medical Record Number: [id number] Patient Account Number: [id number] Date of Birth/Sex: Treating RN: 27-Feb-1956 (66 y.o. [name] Primary Care Provider: Leward Quan Other Clinician: Referring Provider: Treating  Provider/Extender: Durwin Glaze in Treatment: 22 Diagnosis Coding ICD-10 Codes Code Description 2311999636 Pressure ulcer of sacral region, stage 3 G82.54 Quadriplegia, C5-C7 incomplete I48.0 Paroxysmal atrial fibrillation I95.89 Other hypotension Facility Procedures CPT4 Code Description Modifier Quantity [id number] - WOUND CARE VISIT-LEV 3 EST PT 1 Physician Procedures Quantity CPT4 Code Description Modifier [id number] - WC PHYS LEVEL 3 - EST PT 1 ICD-10 Diagnosis Description L89.153 Pressure ulcer of sacral region, stage 3 G82.54 Quadriplegia, C5-C7 incomplete I48.0 Paroxysmal atrial fibrillation I95.89 Other hypotension Electronic Signature([name]) Signed: 05/24/2022 1:09:26 PM By: Worthy Keeler PA-C Entered By: Worthy Keeler on 05/24/2022 13:09:26

## 2022-05-26 NOTE — Progress Notes (Signed)
[place], [place] ([id number]) [id number] Page 1 of 9 Visit Report for 05/24/2022 Arrival Information Details Patient Name: Date of Service: [name] 05/24/2022 11:30 A M Medical Record Number: [id number] Patient Account Number: [id number] Date of Birth/Sex: Treating RN: 08-28-1955 (66 y.o. [name]: [name] Other Clinician: Referring Stevana Dufner: Treating Norvell Ureste/Extender: Durwin Glaze in Treatment: 87 Visit Information History Since Last Visit Added or deleted any medications: No Patient Arrived: Wheel Chair Any new allergies or adverse reactions: No Arrival Time: 11:45 Had a fall or experienced change in No Accompanied By: self activities of daily living that may affect Transfer Assistance: [name] risk of falls: Patient Identification Verified: Yes Signs or symptoms of abuse/neglect since last visito No Secondary Verification Process Completed: Yes Hospitalized since last visit: No Patient Requires Transmission-Based Precautions: No Pain Present Now: No Patient Has Alerts: Yes Patient Alerts: NOT DIABETIC Electronic Signature(s) Signed: 05/25/2022 1:42:10 PM By: Rosalio Loud MSN RN CNS WTA Entered By: Rosalio Loud on 05/24/2022 11:52:28 -------------------------------------------------------------------------------- Clinic Level of Care Assessment Details Patient Name: Date of Service: [name] 05/24/2022 11:30 A M Medical Record Number: [id number] Patient Account Number: [id number] Date of Birth/Sex: Treating RN: 1956-01-13 (66 y.o. [name] Primary Care Jamileth Putzier: [name] Other Clinician: Referring Solace Wendorff: Treating Katelynne Revak/Extender: Durwin Glaze in Treatment: 22 Clinic Level of Care Assessment Items TOOL 4 Quantity Score X- 1 0 Use when only an EandM is performed on FOLLOW-UP visit ASSESSMENTS - Nursing Assessment / Reassessment X- 1 10 Reassessment  of Co-morbidities (includes updates in patient status) X- 1 5 Reassessment of Adherence to Treatment Plan ASSESSMENTS - Wound and Skin A ssessment / Reassessment X - Simple Wound Assessment / Reassessment - one wound 1 [place], [name] ([id number]) [id number] Page 2 of 9 _0  - 0 Complex Wound Assessment / Reassessment - multiple wounds _1  - 0 Dermatologic / Skin Assessment (not related to wound area) ASSESSMENTS - Focused Assessment _2  - 0 Circumferential Edema Measurements - multi extremities _3  - 0 Nutritional Assessment / Counseling / Intervention _4  - 0 Lower Extremity Assessment (monofilament, tuning fork, pulses) _5  - 0 Peripheral Arterial Disease Assessment (using hand held doppler) ASSESSMENTS - Ostomy and/or Continence Assessment and Care _6  - 0 Incontinence Assessment and Management _7  - 0 Ostomy Care Assessment and Management (repouching, etc.) PROCESS - Coordination of Care X - Simple Patient / Family Education for ongoing care 1 15 _8  - 0 Complex (extensive) Patient / Family Education for ongoing care X- 1 10 Staff obtains Programmer, systems, Records, T Results / Process Orders est _9  - 0 Staff telephones HHA, Nursing Homes / Clarify orders / etc _10  - 0 Routine Transfer to another Facility (non-emergent condition) _11  - 0 Routine Hospital Admission (non-emergent condition) _12  - 0 New Admissions / Biomedical engineer / Ordering NPWT Apligraf, etc. , _13  - 0 Emergency Hospital Admission (emergent condition) X- 1 10 Simple Discharge Coordination _14  - 0 Complex (extensive) Discharge Coordination PROCESS - Special Needs _15  - 0 Pediatric / Minor Patient Management _16  - 0 Isolation Patient Management _17  - 0 Hearing / Language / Visual special needs _18  - 0 Assessment of Community assistance (transportation, D/C planning, etc.) _19  - 0 Additional assistance / Altered mentation _20  - 0 Support Surface(s) Assessment (bed, cushion, seat,  etc.) INTERVENTIONS - Wound Cleansing / Measurement X - Simple Wound Cleansing - one wound 1 5 _21  - 0 Complex Wound Cleansing - multiple wounds X- 1  5 Wound Imaging (photographs - any number of wounds) _0  - 0 Wound Tracing (instead of photographs) X- 1 5 Simple Wound Measurement - one wound _1  - 0 Complex Wound Measurement - multiple wounds INTERVENTIONS - Wound Dressings _2  - 0 Small Wound Dressing one or multiple wounds X- 1 15 Medium Wound Dressing one or multiple wounds _3  - 0 Large Wound Dressing one or multiple wounds <EQASTMHDQQIWLNLG>_9<\/QJJHERDEYCXKGYJE>_5  - 0 Application of Medications - topical <UDJSHFWYOVZCHYIF>_0<\/YDXAJOINOMVEHMCN>_4  - 0 Application of Medications - injection INTERVENTIONS - Miscellaneous _6  - 0 External ear exam _7  - 0 Specimen Collection (cultures, biopsies, blood, body fluids, etc.) _8  - 0 Specimen(s) / Culture(s) sent or taken to Lab for analysis [facility], [name] ([id number]) [id number] Page 3 of 9 _9  - 0 Patient Transfer (multiple staff / Civil Service fast streamer / Similar devices) _10  - 0 Simple Staple / Suture removal (25 or less) _11  - 0 Complex Staple / Suture removal (26 or more) _12  - 0 Hypo / Hyperglycemic Management (close monitor of Blood Glucose) _13  - 0 Ankle / Brachial Index (ABI) - [name] not check if billed separately X- 1 5 Vital Signs Has the patient been seen at the hospital within the last three years: Yes Total Score: 90 Level Of Care: New/Established - Level 3 Electronic Signature(s) Signed: 05/25/2022 1:42:10 PM By: Rosalio Loud MSN RN CNS WTA Entered By: Rosalio Loud on 05/24/2022 12:02:50 -------------------------------------------------------------------------------- Encounter Discharge Information Details Patient Name: Date of Service: Michael [place], [name] UGLA S 05/24/2022 11:30 A M Medical Record Number: [id number] Patient Account Number: [id number] Date of Birth/Sex: Treating RN: 1955-07-01 (66 y.o. [name] Primary Care Larkin Alfred: [name] Other Clinician: Referring  Jayln Branscom: Treating Bilaal Leib/Extender: Durwin Glaze in Treatment: 22 Encounter Discharge Information Items Discharge Condition: Stable Ambulatory Status: Wheelchair Discharge Destination: Home Transportation: Other Accompanied By: self Schedule Follow-up Appointment: Yes Clinical Summary of Care: Electronic Signature(s) Signed: 05/24/2022 4:54:57 PM By: Rosalio Loud MSN RN CNS WTA Entered By: Rosalio Loud on 05/24/2022 16:54:57 -------------------------------------------------------------------------------- Lower Extremity Assessment Details Patient Name: Date of Service: Michael [place], [name] UGLA S 05/24/2022 11:30 A M Medical Record Number: [id number] Patient Account Number: [id number] Date of Birth/Sex: Treating RN: 01/03/1956 (66 y.o. [name] Primary Care Najma Bozarth: [name] Other Clinician: [name] ([id number]) [id number] Page 4 of 9 Referring Janaiyah Blackard: Treating Liesa Tsan/Extender: Durwin Glaze in Treatment: 22 Electronic Signature(s) Signed: 05/24/2022 4:53:32 PM By: Rosalio Loud MSN RN CNS WTA Entered By: Rosalio Loud on 05/24/2022 16:53:32 -------------------------------------------------------------------------------- Multi Wound Chart Details Patient Name: Date of Service: Michael [place], [name] UGLA S 05/24/2022 11:30 A M Medical Record Number: [id number] Patient Account Number: [id number] Date of Birth/Sex: Treating RN: Dec 19, 1955 (66 y.o. [name] Primary Care Rashauna Tep: [name] Other Clinician: Referring Izan Miron: Treating Jaelani Posa/Extender: Durwin Glaze in Treatment: 22 Vital Signs Height(in): 76 Pulse(bpm): 52 Weight(lbs): 167 Blood Pressure(mmHg): 87/66 Body Mass Index(BMI): 20.3 Temperature(F): 97.7 Respiratory Rate(breaths/min): 16 [1:Photos:] [N/A:N/A] Midline Sacrum N/A N/A Wound Location: Pressure Injury N/A N/A Wounding Event: Pressure Ulcer N/A N/A Primary  Etiology: Arrhythmia, Hypotension, History of N/A N/A Comorbid History: pressure wounds, Quadriplegia 10/18/2021 N/A N/A Date Acquired: 22 N/A N/A Weeks of Treatment: Open N/A N/A Wound Status: No N/A N/A Wound Recurrence: 5.5x2.5x1.7 N/A N/A Measurements L x W x D (cm) 10.799 N/A N/A A (cm) : rea 18.359 N/A N/A Volume (cm) : -367.70% N/A N/A % Reduction in A rea: -3873.80% N/A N/A % Reduction in Volume: Category/Stage III N/A N/A Classification: Medium  N/A N/A Exudate A mount: Serous N/A N/A Exudate Type: amber N/A N/A Exudate Color: Epibole N/A N/A Wound Margin: Large (67-100%) N/A N/A Granulation A mount: Red N/A N/A Granulation Quality: Small (1-33%) N/A N/A Necrotic A mount: Fat Layer (Subcutaneous Tissue): Yes N/A N/A Exposed Structures: Fascia: No Tendon: No Muscle: No Joint: No Bone: No Small (1-33%) N/A N/A Epithelialization: Folse, Kenzel ([id number]) [id number] Page 5 of 9 Treatment Notes Wound #1 (Sacrum) Wound Laterality: Midline Cleanser Soap and Water Discharge Instruction: Gently cleanse wound with antibacterial soap, rinse and pat dry prior to dressing wounds Peri-Wound Care Topical Primary Dressing Hydrofera Blue Ready Transfer Foam, 4x5 (in/in) Discharge Instruction: Apply Hydrofera Blue Ready to wound bed as directed Secondary Dressing (BORDER) Zetuvit Plus SILICONE BORDER Dressing 4x4 (in/in) Discharge Instruction: Please [name] not put silicone bordered dressings under wraps. Use non-bordered dressing only. When applying please make sure skin is pulled flat so dressing stays in place. Secured With Compression Wrap Compression Stockings Environmental education officer) Signed: 05/24/2022 4:54:27 PM By: Rosalio Loud MSN RN CNS WTA Entered By: Rosalio Loud on 05/24/2022 16:54:27 -------------------------------------------------------------------------------- Multi-Disciplinary Care Plan Details Patient  Name: Date of Service: [facility], [name] UGLA S 05/24/2022 11:30 A M Medical Record Number: [id number] Patient Account Number: [id number] Date of Birth/Sex: Treating RN: January 22, 1956 (66 y.o. [name] Primary Care Buel Molder: [name] Other Clinician: Referring Feliciana Narayan: Treating Alegria Dominique/Extender: Durwin Glaze in Treatment: 22 Active Inactive Pressure Nursing Diagnoses: Knowledge deficit related to management of pressures ulcers Goals: Patient will remain free from development of additional pressure ulcers Date Initiated: 12/18/2021 Target Resolution Date: 05/19/2022 Goal Status: Active Patient will remain free of pressure ulcers Date Initiated: 12/18/2021 Target Resolution Date: 05/19/2022 Goal Status: Active Patient/caregiver will verbalize risk factors for pressure ulcer development Date Initiated: 12/18/2021 Target Resolution Date: 06/16/2022 Goal Status: Active [place], [name] ([id number]) [id number] Page 6 of 9 Patient/caregiver will verbalize understanding of pressure ulcer management Date Initiated: 12/18/2021 Target Resolution Date: 05/19/2022 Goal Status: Active Interventions: Assess: immobility, friction, shearing, incontinence upon admission and as needed Assess offloading mechanisms upon admission and as needed Assess potential for pressure ulcer upon admission and as needed Provide education on pressure ulcers Treatment Activities: Patient referred for seating evaluation to ensure proper offloading : 12/18/2021 Notes: Wound/Skin Impairment Nursing Diagnoses: Impaired tissue integrity Knowledge deficit related to smoking impact on wound healing Knowledge deficit related to ulceration/compromised skin integrity Goals: Patient/caregiver will verbalize understanding of skin care regimen Date Initiated: 12/18/2021 Date Inactivated: 01/25/2022 Target Resolution Date: 12/18/2021 Goal Status: Met Ulcer/skin breakdown will have a volume  reduction of 30% by week 4 Date Initiated: 12/18/2021 Target Resolution Date: 05/19/2022 Goal Status: Active Interventions: Assess ulceration(s) every visit Treatment Activities: Referred to DME Tysheem Accardo for dressing supplies : 12/18/2021 Skin care regimen initiated : 12/18/2021 Topical wound management initiated : 12/18/2021 Notes: Electronic Signature(s) Signed: 05/25/2022 1:42:10 PM By: Rosalio Loud MSN RN CNS WTA Entered By: Rosalio Loud on 05/24/2022 12:03:13 -------------------------------------------------------------------------------- Pain Assessment Details Patient Name: Date of Service: Yolanda Manges, [name] UGLA S 05/24/2022 11:30 A M Medical Record Number: [id number] Patient Account Number: [id number] Date of Birth/Sex: Treating RN: 04/21/1956 (66 y.o. [name] Primary Care Roseline Ebarb: [name] Other Clinician: Referring Ranell Finelli: Treating Hayly Litsey/Extender: Durwin Glaze in Treatment: 22 Active Problems Location of Pain Severity and Description of Pain Patient Has Paino No Site Locations [place], [place] ([id number]) [phone].pdf Page 7 of 9 Pain Management and Medication Current Pain Management: Electronic Signature(s) Signed: 05/25/2022 1:42:10 PM  By: Rosalio Loud MSN RN CNS WTA Entered By: Rosalio Loud on 05/24/2022 12:01:01 -------------------------------------------------------------------------------- Patient/Caregiver Education Details Patient Name: Date of Service: [facility], [name] UGLA S 12/7/2023andnbsp11:30 A M Medical Record Number: [id number] Patient Account Number: [id number] Date of Birth/Gender: Treating RN: 08-Oct-1955 (66 y.o. [name] Primary Care Physician: [name] Other Clinician: Referring Physician: Treating Physician/Extender: Durwin Glaze in Treatment: 22 Education Assessment Education Provided To: Patient Education Topics Provided Pressure: Handouts: Pressure Ulcers: Care and  Offloading Methods: Explain/Verbal Responses: State content correctly Electronic Signature(s) Signed: 05/25/2022 1:42:10 PM By: Rosalio Loud MSN RN CNS WTA Entered By: Rosalio Loud on 05/24/2022 12:03:08 [place], [name] ([id number]) [id number] Page 8 of 9 -------------------------------------------------------------------------------- Wound Assessment Details Patient Name: Date of Service: [name] 05/24/2022 11:30 A M Medical Record Number: [id number] Patient Account Number: [id number] Date of Birth/Sex: Treating RN: Nov 01, 1955 (66 y.o. [name] Primary Care Christain Niznik: [name] Other Clinician: Referring Suesan Mohrmann: Treating [place] Paone/Extender: Durwin Glaze in Treatment: 22 Wound Status Wound Number: 1 Primary Pressure Ulcer Etiology: Wound Location: Midline Sacrum Wound Status: Open Wounding Event: Pressure Injury Comorbid Arrhythmia, Hypotension, History of pressure wounds, Date Acquired: 10/18/2021 History: Quadriplegia Weeks Of Treatment: 22 Clustered Wound: No Photos Wound Measurements Length: (cm) 5.5 Width: (cm) 2.5 Depth: (cm) 1.7 Area: (cm) 10.799 Volume: (cm) 18.359 % Reduction in Area: -367.7% % Reduction in Volume: -3873.8% Epithelialization: Small (1-33%) Wound Description Classification: Category/Stage III Wound Margin: Epibole Exudate Amount: Medium Exudate Type: Serous Exudate Color: amber Foul Odor After Cleansing: No Slough/Fibrino Yes Wound Bed Granulation Amount: Large (67-100%) Exposed Structure Granulation Quality: Red Fascia Exposed: No Necrotic Amount: Small (1-33%) Fat Layer (Subcutaneous Tissue) Exposed: Yes Necrotic Quality: Adherent Slough Tendon Exposed: No Muscle Exposed: No Joint Exposed: No Bone Exposed: No Treatment Notes Wound #1 (Sacrum) Wound Laterality: Midline Cleanser Soap and Water Discharge Instruction: Gently cleanse wound with antibacterial soap, rinse and pat  dry prior to dressing wounds [facility], [place] ([id number]) [id number] Page 9 of 9 Peri-Wound Care Topical Primary Dressing Hydrofera Blue Ready Transfer Foam, 4x5 (in/in) Discharge Instruction: Apply Hydrofera Blue Ready to wound bed as directed Secondary Dressing (BORDER) Zetuvit Plus SILICONE BORDER Dressing 4x4 (in/in) Discharge Instruction: Please [name] not put silicone bordered dressings under wraps. Use non-bordered dressing only. When applying please make sure skin is pulled flat so dressing stays in place. Secured With Compression Wrap Compression Stockings Environmental education officer) Signed: 05/25/2022 1:42:10 PM By: Rosalio Loud MSN RN CNS WTA Entered By: Rosalio Loud on 05/24/2022 12:06:14 -------------------------------------------------------------------------------- Vitals Details Patient Name: Date of Service: Michael [place], [name] UGLA S 05/24/2022 11:30 A M Medical Record Number: [id number] Patient Account Number: [id number] Date of Birth/Sex: Treating RN: 01-Apr-1956 (66 y.o. [name] Primary Care Raidyn Wassink: [name] Other Clinician: Referring Windel Keziah: Treating [place] Nalepa/Extender: Durwin Glaze in Treatment: 22 Vital Signs Time Taken: 11:52 Temperature (F): 97.7 Height (in): 76 Pulse (bpm): 52 Weight (lbs): 167 Respiratory Rate (breaths/min): 16 Body Mass Index (BMI): 20.3 Blood Pressure (mmHg): 87/66 Reference Range: 80 - 120 mg / dl Electronic Signature(s) Signed: 05/25/2022 1:42:10 PM By: Rosalio Loud MSN RN CNS WTA Entered By: Rosalio Loud on 05/24/2022 12:00:55

## 2022-05-28 ENCOUNTER — Other Ambulatory Visit: Payer: Self-pay

## 2022-05-28 ENCOUNTER — Ambulatory Visit: Payer: Medicare (Managed Care)

## 2022-05-28 ENCOUNTER — Ambulatory Visit
Admission: RE | Admit: 2022-05-28 | Discharge: 2022-05-28 | Disposition: A | Discharge status: Home / Self Care | Payer: Medicare (Managed Care) | Source: Ambulatory Visit | Attending: Radiation Oncology | Admitting: Radiation Oncology

## 2022-05-28 DIAGNOSIS — C61 Malignant neoplasm of prostate: Secondary | ICD-10-CM | POA: Diagnosis not present

## 2022-05-28 LAB — RAD ONC ARIA SESSION SUMMARY
Course Elapsed Days: 35
Plan Fractions Treated to Date: 22
Plan Prescribed Dose Per Fraction: 2.5 Gy
Plan Total Fractions Prescribed: 28
Plan Total Prescribed Dose: 70 Gy
Reference Point Dosage Given to Date: 55 Gy
Reference Point Session Dosage Given: 2.5 Gy
Session Number: 22

## 2022-05-29 ENCOUNTER — Other Ambulatory Visit: Payer: Self-pay

## 2022-05-29 ENCOUNTER — Ambulatory Visit: Payer: Medicare (Managed Care)

## 2022-05-29 ENCOUNTER — Ambulatory Visit
Admission: RE | Admit: 2022-05-29 | Discharge: 2022-05-29 | Disposition: A | Discharge status: Home / Self Care | Payer: Medicare (Managed Care) | Source: Ambulatory Visit | Attending: Radiation Oncology | Admitting: Radiation Oncology

## 2022-05-29 DIAGNOSIS — C61 Malignant neoplasm of prostate: Secondary | ICD-10-CM | POA: Diagnosis not present

## 2022-05-29 LAB — RAD ONC ARIA SESSION SUMMARY
Course Elapsed Days: 36
Plan Fractions Treated to Date: 23
Plan Prescribed Dose Per Fraction: 2.5 Gy
Plan Total Fractions Prescribed: 28
Plan Total Prescribed Dose: 70 Gy
Reference Point Dosage Given to Date: 57.5 Gy
Reference Point Session Dosage Given: 2.5 Gy
Session Number: 23

## 2022-05-30 ENCOUNTER — Ambulatory Visit: Payer: Medicare (Managed Care)

## 2022-05-30 ENCOUNTER — Inpatient Hospital Stay: Payer: Medicare (Managed Care)

## 2022-05-30 ENCOUNTER — Other Ambulatory Visit: Payer: Self-pay

## 2022-05-30 ENCOUNTER — Ambulatory Visit
Admission: RE | Admit: 2022-05-30 | Discharge: 2022-05-30 | Disposition: A | Discharge status: Home / Self Care | Payer: Medicare (Managed Care) | Source: Ambulatory Visit | Attending: Radiation Oncology | Admitting: Radiation Oncology

## 2022-05-30 DIAGNOSIS — C61 Malignant neoplasm of prostate: Secondary | ICD-10-CM

## 2022-05-30 LAB — RAD ONC ARIA SESSION SUMMARY
Course Elapsed Days: 37
Plan Fractions Treated to Date: 24
Plan Prescribed Dose Per Fraction: 2.5 Gy
Plan Total Fractions Prescribed: 28
Plan Total Prescribed Dose: 70 Gy
Reference Point Dosage Given to Date: 60 Gy
Reference Point Session Dosage Given: 2.5 Gy
Session Number: 24

## 2022-05-30 LAB — CBC
HCT: 40.9 % (ref 39.0–52.0)
Hemoglobin: 13 g/dL (ref 13.0–17.0)
MCH: 27.5 pg (ref 26.0–34.0)
MCHC: 31.8 g/dL (ref 30.0–36.0)
MCV: 86.5 fL (ref 80.0–100.0)
Platelets: 194 10*3/uL (ref 150–400)
RBC: 4.73 MIL/uL (ref 4.22–5.81)
RDW: 13.1 % (ref 11.5–15.5)
WBC: 4.3 10*3/uL (ref 4.0–10.5)
nRBC: 0 % (ref 0.0–0.2)

## 2022-05-31 ENCOUNTER — Ambulatory Visit: Payer: Medicare (Managed Care)

## 2022-05-31 ENCOUNTER — Other Ambulatory Visit: Payer: Self-pay

## 2022-05-31 ENCOUNTER — Ambulatory Visit
Admission: RE | Admit: 2022-05-31 | Discharge: 2022-05-31 | Disposition: A | Discharge status: Home / Self Care | Payer: Medicare (Managed Care) | Source: Ambulatory Visit | Attending: Radiation Oncology | Admitting: Radiation Oncology

## 2022-05-31 DIAGNOSIS — C61 Malignant neoplasm of prostate: Secondary | ICD-10-CM | POA: Diagnosis not present

## 2022-05-31 LAB — RAD ONC ARIA SESSION SUMMARY
Course Elapsed Days: 38
Plan Fractions Treated to Date: 25
Plan Prescribed Dose Per Fraction: 2.5 Gy
Plan Total Fractions Prescribed: 28
Plan Total Prescribed Dose: 70 Gy
Reference Point Dosage Given to Date: 62.5 Gy
Reference Point Session Dosage Given: 2.5 Gy
Session Number: 25

## 2022-06-01 ENCOUNTER — Ambulatory Visit
Admission: RE | Admit: 2022-06-01 | Discharge: 2022-06-01 | Disposition: A | Discharge status: Home / Self Care | Payer: Medicare (Managed Care) | Source: Ambulatory Visit | Attending: Radiation Oncology | Admitting: Radiation Oncology

## 2022-06-01 ENCOUNTER — Ambulatory Visit: Payer: Medicare (Managed Care)

## 2022-06-01 ENCOUNTER — Other Ambulatory Visit: Payer: Self-pay

## 2022-06-01 DIAGNOSIS — C61 Malignant neoplasm of prostate: Secondary | ICD-10-CM | POA: Diagnosis not present

## 2022-06-01 LAB — RAD ONC ARIA SESSION SUMMARY
Course Elapsed Days: 39
Plan Fractions Treated to Date: 26
Plan Prescribed Dose Per Fraction: 2.5 Gy
Plan Total Fractions Prescribed: 28
Plan Total Prescribed Dose: 70 Gy
Reference Point Dosage Given to Date: 65 Gy
Reference Point Session Dosage Given: 2.5 Gy
Session Number: 26

## 2022-06-04 ENCOUNTER — Ambulatory Visit
Admission: RE | Admit: 2022-06-04 | Discharge: 2022-06-04 | Disposition: A | Discharge status: Home / Self Care | Payer: Medicare (Managed Care) | Source: Ambulatory Visit | Attending: Radiation Oncology | Admitting: Radiation Oncology

## 2022-06-04 ENCOUNTER — Ambulatory Visit: Payer: Medicare (Managed Care)

## 2022-06-04 ENCOUNTER — Other Ambulatory Visit: Payer: Self-pay

## 2022-06-04 DIAGNOSIS — C61 Malignant neoplasm of prostate: Secondary | ICD-10-CM | POA: Diagnosis not present

## 2022-06-04 LAB — RAD ONC ARIA SESSION SUMMARY
Course Elapsed Days: 42
Plan Fractions Treated to Date: 27
Plan Prescribed Dose Per Fraction: 2.5 Gy
Plan Total Fractions Prescribed: 28
Plan Total Prescribed Dose: 70 Gy
Reference Point Dosage Given to Date: 67.5 Gy
Reference Point Session Dosage Given: 2.5 Gy
Session Number: 27

## 2022-06-05 ENCOUNTER — Ambulatory Visit
Admission: RE | Admit: 2022-06-05 | Discharge: 2022-06-05 | Disposition: A | Discharge status: Home / Self Care | Payer: Medicare (Managed Care) | Source: Ambulatory Visit | Attending: Radiation Oncology | Admitting: Radiation Oncology

## 2022-06-05 ENCOUNTER — Other Ambulatory Visit: Payer: Self-pay

## 2022-06-05 ENCOUNTER — Ambulatory Visit: Payer: Medicare (Managed Care)

## 2022-06-05 DIAGNOSIS — C61 Malignant neoplasm of prostate: Secondary | ICD-10-CM | POA: Diagnosis not present

## 2022-06-05 LAB — RAD ONC ARIA SESSION SUMMARY
Course Elapsed Days: 43
Plan Fractions Treated to Date: 28
Plan Prescribed Dose Per Fraction: 2.5 Gy
Plan Total Fractions Prescribed: 28
Plan Total Prescribed Dose: 70 Gy
Reference Point Dosage Given to Date: 70 Gy
Reference Point Session Dosage Given: 2.5 Gy
Session Number: 28

## 2022-06-06 ENCOUNTER — Ambulatory Visit: Payer: Medicare (Managed Care)

## 2022-06-07 ENCOUNTER — Encounter: Payer: Medicare (Managed Care) | Admitting: Physician Assistant

## 2022-06-07 ENCOUNTER — Ambulatory Visit: Payer: Medicare (Managed Care)

## 2022-06-07 DIAGNOSIS — L89153 Pressure ulcer of sacral region, stage 3: Secondary | ICD-10-CM | POA: Diagnosis not present

## 2022-06-07 NOTE — Progress Notes (Addendum)
Jal, [name] ([id number]) [id number] Page 1 of 7 Visit Report for 06/07/2022 Chief Complaint Document Details Patient Name: Date of Service: [name] 06/07/2022 11:30 A M Medical Record Number: [id number] Patient Account Number: [id number] Date of Birth/Sex: Treating RN: 11/15/1955 (66 y.o. [name] Primary Care Provider: Leward Quan Other Clinician: Referring Provider: Treating Provider/Extender: Durwin Glaze in Treatment: 24 Information Obtained from: Patient Chief Complaint Sacral pressure ulcer Electronic Signature([name]) Signed: 06/07/2022 11:25:12 AM By: Worthy Keeler PA-C Entered By: Worthy Keeler on 06/07/2022 11:25:12 -------------------------------------------------------------------------------- HPI Details Patient Name: Date of Service: [name], [name] 06/07/2022 11:30 A M Medical Record Number: [id number] Patient Account Number: [id number] Date of Birth/Sex: Treating RN: [birth year] (66 y.o. [name] Primary Care Provider: Leward Quan Other Clinician: Referring Provider: Treating Provider/Extender: Durwin Glaze in Treatment: 24 History of Present Illness HPI Description: 12-18-2021 upon evaluation today patient presents for initial evaluation here in the clinic concerning an issue he has been having over the sacral area where he has a wound this is a stage III pressure ulcer. It also appears there may still be some moisture component to this based on what I am seeing. I Michael not see any evidence of infection but at the same time I Michael believe that the patient is in need of some additional and hopefully better dressings in order to help this heal more appropriately and quickly. Currently just a protective dressing has been used over this sounds like may possibly be a border foam. The patient did have a CT scan which was negative for osteomyelitis he had a previous wound above this in the sacral  region but not necessarily right of this area. 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 He also has atrial fibrillation and hypotension. 01-01-2022 upon evaluation today patient appears to be doing well with regard to his wound actually see some signs of improvement there is still quite a bit of feeling to be done here but nonetheless I think we are on the right track. I Michael not see any evidence of active infection locally or systemically at this time which is great news. 01-25-2022 upon evaluation today patient'[name] wound really is not significantly better it actually appears about the same. Of note I Michael believe that part of the issue here is probably the fact that honestly it is hard to keep the dressing in place I think that when the alginate is put on it needs to be cut bigger than what is currently being seen on the dressing today probably by about 3 times that size and placed directly to the wound bed while spreading the butt cheeks bilaterally. This will allow for the dressing to be right intact with the wound bed so that when it does try to closing on itself it will actually fold on the alginate and not [facility], [name] ([id number]) [id number] Page 2 of 7 actually contact skin to skin. 02-09-2022 upon evaluation today patient appears to be doing about the same in regard to his wound. Again I Michael not think the dressings being quite put on correctly keeping the alginate in contact with the wound bed this just does not seem to be quite doing the job. I think we may need to switch to Dakin'[name] moistened gauze dressing this is a very precarious location I am not even sure that he is not getting some feces into this dressing and underneath. I  think it is probably going to be changed daily and switch again to the Dakin'[name]. 02-23-2022 upon evaluation today patient unfortunately still continues to have issues with his sacral area. Again this is a very  precarious spot but honestly I think is made worse by the fact that he is up sitting the majority of the day. He typically gets up by 10 he tells me and does not go to bed until around 11 or 12 this is a good solid at least 12 to 14 hours that he is up day in and day out. With that being said I think that this is probably one of the main reasons why this wound is not healing the other being obviously moisture control I Michael think the surface of the wound looks better this week compared to previous with the Dakin'[name] but at the same time the wound itself does not appear to be any smaller. 03-12-2022 upon evaluation today patient appears to be doing poorly currently in regard to the wound and sacral region. Unfortunately this is not showing signs of improvement and he actually has a small area of deep tissue injury right in the middle right at the point where the bone of the coccyx is pushing onto the wound bed. Again I think that this is likely end up opening up into a much deeper wound that extends down to bone if he is unable to get off of this. So far he has not been able to really Michael this effectively tells me that he has been "trying" but only succeeding minimally. 03-27-2022 upon evaluation today patient appears to be doing a little better in regard to his wound compared to last time I saw him. At that point he started to have some breakdown of the central portion of the wound the good news is that has improved. 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 04-09-2022 upon evaluation today patient'[name] wound is actually showing signs of doing okay. Its not significantly smaller and is also not any larger nor showing signs of breaking down. He tells me he is still trying to stay off of this on the weekends. He also tells me during the week he is going to the pace program more frequently 3 times a  week at this point. With that being said overall I am pleased in that regard and I am happy that he is doing somewhat better. With that being said I explained to the patient as well that I Michael believe he still needs to be offloading is much as he can. 05-03-2022 upon evaluation today patient appears to be doing well currently in regard to his wound from the standpoint of it not being infected I Michael not see anything obvious at this point which is great news. Fortunately I Michael not see any signs of active infection locally or systemically at this well which is also great news. Overall I Michael believe that the biggest issue simply is just that he is going to have a lot of trouble getting this to heal simply due to the fact that he has a lot of issues getting off of this routinely and even so it is a very difficult location even in the best of circumstances. Again its not deep enough for wound VAC right now. Hopefully will not get to that point. 05-24-2022 upon evaluation today patient actually does have  some signs of new skin growth around the edges of the wound. This was very subtle but nonetheless does seem to be doing better compared to what I previously seen. I think that the wound looks clean and does not appear to be trying to get any deeper which is good news. He does tell me that he is nearing the completion of his radiation therapy. That is for the prostate. 06-07-2022 upon evaluation today patient appears to be doing well currently in regard to his wound there does appear to be more granulation tissue which is healthy showing up at this point. Fortunately I Michael not see any signs of active infection at this time. No fevers, chills, nausea, vomiting, or diarrhea. Electronic Signature([name]) Signed: 06/07/2022 12:50:10 PM By: Worthy Keeler PA-C Entered By: Worthy Keeler on 06/07/2022 12:50:10 -------------------------------------------------------------------------------- Physical Exam Details Patient  Name: Date of Service: Michael [employer], Michael [name] 06/07/2022 11:30 A M Medical Record Number: [id number] Patient Account Number: [id number] Date of Birth/Sex: Treating RN: 1955-09-15 (66 y.o. [name] Primary Care Provider: Leward Quan Other Clinician: Referring Provider: Treating Provider/Extender: Durwin Glaze in Treatment: 68 Constitutional Well-nourished and well-hydrated in no acute distress. Respiratory normal breathing without difficulty. Psychiatric this patient is able to make decisions and demonstrates good insight into disease process. Alert and Oriented x 3. pleasant and cooperative. Notes Upon inspection patient'[name] wound bed actually showed signs of good granulation epithelization at this point. Fortunately I see no signs of active infection which is great news I Michael feel like there is more additional growth at this point in time as far as the wound surface is concerned although this is still going to be quite a ways as far as getting it to completely closed. [place], [name] ([id number]) [id number] Page 3 of 7 Electronic Signature([name]) Signed: 06/07/2022 12:51:07 PM By: Worthy Keeler PA-C Entered By: Worthy Keeler on 06/07/2022 12:51:07 -------------------------------------------------------------------------------- Physician Orders Details Patient Name: Date of Service: [name], [name] 06/07/2022 11:30 A M Medical Record Number: [id number] Patient Account Number: [id number] Date of Birth/Sex: Treating RN: [birth year] (66 y.o. [name] Primary Care Provider: Leward Quan Other Clinician: Referring Provider: Treating Provider/Extender: Durwin Glaze in Treatment: 24 Verbal / Phone Orders: No Diagnosis Coding ICD-10 Coding Code Description L89.153 Pressure ulcer of sacral region, stage 3 G82.54 Quadriplegia, C5-C7 incomplete I48.0 Paroxysmal atrial fibrillation I95.89 Other hypotension Follow-up  Appointments Wound #1 Midline Sacrum Return Appointment in 2 weeks. Home Health Wound #1 [place]: - [place] for wound care. May utilize formulary equivalent dressing for wound treatment orders unless otherwise specified. Home Health Nurse may visit PRN to address patients wound care needs. Scheduled days for dressing changes to be completed; exception, patient has scheduled wound care visit that day. **Please direct any NON-WOUND related issues/requests for orders to patient'[name] Primary Care Physician. **If current dressing causes regression in wound condition, may D/C ordered dressing product/[name] and apply Normal Saline Moist Dressing daily until next [place] or Other MD appointment. **Notify Wound Healing Center of regression in wound condition at [phone]. Bathing/ Shower/ Hygiene May shower; gently cleanse wound with antibacterial soap, rinse and pat dry prior to dressing wounds No tub bath. Off-Loading Roho cushion for wheelchair Turn and reposition every 2 hours Additional Orders / Instructions Follow Nutritious Diet and Increase Protein Intake Wound Treatment Wound #1 - Sacrum Wound Laterality: Midline Cleanser: Soap and Water 1 x Per Day/30  Days Discharge Instructions: Gently cleanse wound with antibacterial soap, rinse and pat dry prior to dressing wounds Prim Dressing: Hydrofera Blue Ready Transfer Foam, 4x5 (in/in) (Dispense As Written) 1 x Per Day/30 Days ary Discharge Instructions: Apply Hydrofera Blue Ready to wound bed as directed Secondary Dressing: (BORDER) Zetuvit Plus SILICONE BORDER Dressing 4x4 (in/in) 1 x Per Day/30 Days Discharge Instructions: Please Michael not put silicone bordered dressings under wraps. Use non-bordered dressing only. When applying please make sure skin is pulled flat so dressing stays in place. [place], [name] ([id number]) [id number] Page 4 of 7 Electronic  Signature([name]) Signed: 06/07/2022 3:51:33 PM By: Rosalio Loud MSN RN CNS WTA Signed: 06/07/2022 4:23:52 PM By: Worthy Keeler PA-C Entered By: Rosalio Loud on 06/07/2022 11:50:40 -------------------------------------------------------------------------------- Problem List Details Patient Name: Date of Service: Michael [place], [name] 06/07/2022 11:30 A M Medical Record Number: [id number] Patient Account Number: [id number] Date of Birth/Sex: Treating RN: 1956-06-05 (66 y.o. [name] Primary Care Provider: Leward Quan Other Clinician: Referring Provider: Treating Provider/Extender: Durwin Glaze in Treatment: 24 Active Problems ICD-10 Encounter Code Description Active Date MDM Diagnosis L89.153 Pressure ulcer of sacral region, stage 3 12/18/2021 No Yes G82.54 Quadriplegia, C5-C7 incomplete 12/18/2021 No Yes I48.0 Paroxysmal atrial fibrillation 12/18/2021 No Yes I95.89 Other hypotension 12/18/2021 No Yes Inactive Problems Resolved Problems Electronic Signature([name]) Signed: 06/07/2022 11:25:08 AM By: Worthy Keeler PA-C Entered By: Worthy Keeler on 06/07/2022 11:25:08 Progress Note Details -------------------------------------------------------------------------------- [name] ([id number]) [id number] Page 5 of 7 Patient Name: Date of Service: [name] 06/07/2022 11:30 A M Medical Record Number: [id number] Patient Account Number: [id number] Date of Birth/Sex: Treating RN: 1955-11-24 (66 y.o. [name] Primary Care Provider: Leward Quan Other Clinician: Referring Provider: Treating Provider/Extender: Durwin Glaze in Treatment: 24 Subjective Chief Complaint Information obtained from Patient Sacral pressure ulcer History of Present Illness (HPI) 12-18-2021 upon evaluation today patient presents for initial evaluation here in the clinic concerning an issue he has been having over the sacral area where he has a  wound this is a stage III pressure ulcer. It also appears there may still be some moisture component to this based on what I am seeing. I Michael not see any evidence of infection but at the same time I Michael believe that the patient is in need of some additional and hopefully better dressings in order to help this heal more appropriately and quickly. Currently just a protective dressing has been used over this sounds like may possibly be a border foam. The patient did have a CT scan which was negative for osteomyelitis he had a previous wound above this in the sacral region but not necessarily right of this area. 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 He also has atrial fibrillation and hypotension. 01-01-2022 upon evaluation today patient appears to be doing well with regard to his wound actually see some signs of improvement there is still quite a bit of feeling to be done here but nonetheless I think we are on the right track. I Michael not see any evidence of active infection locally or systemically at this time which is great news. 01-25-2022 upon evaluation today patient'[name] wound really is not significantly better it actually appears about the same. Of note I Michael believe that part of the issue here is probably the fact that honestly it is hard to keep the dressing in place I think that when the alginate  is put on it needs to be cut bigger than what is currently being seen on the dressing today probably by about 3 times that size and placed directly to the wound bed while spreading the butt cheeks bilaterally. This will allow for the dressing to be right intact with the wound bed so that when it does try to closing on itself it will actually fold on the alginate and not actually contact skin to skin. 02-09-2022 upon evaluation today patient appears to be doing about the same in regard to his wound. Again I Michael not think the dressings being quite put on correctly keeping the  alginate in contact with the wound bed this just does not seem to be quite doing the job. I think we may need to switch to Dakin'[name] moistened gauze dressing this is a very precarious location I am not even sure that he is not getting some feces into this dressing and underneath. I think it is probably going to be changed daily and switch again to the Dakin'[name]. 02-23-2022 upon evaluation today patient unfortunately still continues to have issues with his sacral area. Again this is a very precarious spot but honestly I think is made worse by the fact that he is up sitting the majority of the day. He typically gets up by 10 he tells me and does not go to bed until around 11 or 12 this is a good solid at least 12 to 14 hours that he is up day in and day out. With that being said I think that this is probably one of the main reasons why this wound is not healing the other being obviously moisture control I Michael think the surface of the wound looks better this week compared to previous with the Dakin'[name] but at the same time the wound itself does not appear to be any smaller. 03-12-2022 upon evaluation today patient appears to be doing poorly currently in regard to the wound and sacral region. Unfortunately this is not showing signs of improvement and he actually has a small area of deep tissue injury right in the middle right at the point where the bone of the coccyx is pushing onto the wound bed. Again I think that this is likely end up opening up into a much deeper wound that extends down to bone if he is unable to get off of this. So far he has not been able to really Michael this effectively tells me that he has been "trying" but only succeeding minimally. 03-27-2022 upon evaluation today patient appears to be doing a little better in regard to his wound compared to last time I saw him. At that point he started to have some breakdown of the central portion of the wound the good news is that has improved. The bad news  is that he is getting ready to start radiation therapy for his prostate which I think inevitably is going to affect the healing of this wound. 04-09-2022 upon evaluation today patient'[name] wound is actually showing signs of doing okay. Its not significantly smaller and is also not any larger nor showing signs of breaking down. He tells me he is still trying to stay off of this on the weekends. He also tells me during the week he is going to the pace program more frequently 3 times a week at this point. With that being said overall I am pleased in that regard and I am happy that he is doing somewhat better. With that being said  I explained to the patient as well that I Michael believe he still needs to be offloading is much as he can. 05-03-2022 upon evaluation today patient appears to be doing well currently in regard to his wound from the standpoint of it not being infected I Michael not see anything obvious at this point which is great news. Fortunately I Michael not see any signs of active infection locally or systemically at this well which is also great news. Overall I Michael believe that the biggest issue simply is just that he is going to have a lot of trouble getting this to heal simply due to the fact that he has a lot of issues getting off of this routinely and even so it is a very difficult location even in the best of circumstances. Again its not deep enough for wound VAC right now. Hopefully will not get to that point. 05-24-2022 upon evaluation today patient actually does have some signs of new skin growth around the edges of the wound. This was very subtle but nonetheless does seem to be doing better compared to what I previously seen. I think that the wound looks clean and does not appear to be trying to get any deeper which is good news. He does tell me that he is nearing the completion of his radiation therapy. That is for the prostate. 06-07-2022 upon evaluation today patient appears to be doing well  currently in regard to his wound there does appear to be more granulation tissue which is healthy showing up at this point. Fortunately I Michael not see any signs of active infection at this time. No fevers, chills, nausea, vomiting, or diarrhea. Objective Constitutional Well-nourished and well-hydrated in no acute distress. Vitals Time Taken: 11:40 AM, Height: 76 in, Weight: 167 lbs, BMI: 20.3, Temperature: 98.1 F, Pulse: 66 bpm, Respiratory Rate: 16 breaths/min, Blood Pressure: 92/66 mmHg. [place], [name] ([id number]) [id number] Page 6 of 7 Respiratory normal breathing without difficulty. Psychiatric this patient is able to make decisions and demonstrates good insight into disease process. Alert and Oriented x 3. pleasant and cooperative. General Notes: Upon inspection patient'[name] wound bed actually showed signs of good granulation epithelization at this point. Fortunately I see no signs of active infection which is great news I Michael feel like there is more additional growth at this point in time as far as the wound surface is concerned although this is still going to be quite a ways as far as getting it to completely closed. Integumentary (Hair, Skin) Wound #1 status is Open. Original cause of wound was Pressure Injury. The date acquired was: 10/18/2021. The wound has been in treatment 24 weeks. The wound is located on the Midline Sacrum. The wound measures 3cm length x 3cm width x 1.5cm depth; 7.069cm^2 area and 10.603cm^3 volume. There is Fat Layer (Subcutaneous Tissue) exposed. There is a medium amount of serous drainage noted. The wound margin is epibole. There is large (67-100%) red granulation within the wound bed. There is a small (1-33%) amount of necrotic tissue within the wound bed including Adherent Slough. Assessment Active Problems ICD-10 Pressure ulcer of sacral region, stage 3 Quadriplegia, C5-C7 incomplete Paroxysmal atrial fibrillation Other  hypotension Plan Follow-up Appointments: Wound #1 Midline Sacrum: Return Appointment in 2 weeks. Home Health: Wound #1 Midline Sacrum: [place]: - [place] for wound care. May utilize formulary equivalent dressing for wound treatment orders unless otherwise specified. Home Health Nurse may visit PRN to address patientoos wound care needs. Scheduled  days for dressing changes to be completed; exception, patient has scheduled wound care visit that day. **Please direct any NON-WOUND related issues/requests for orders to patient'[name] Primary Care Physician. **If current dressing causes regression in wound condition, may D/C ordered dressing product/[name] and apply Normal Saline Moist Dressing daily until next [place] or Other MD appointment. **Notify [facility]aling Center of regression in wound condition at [phone]. Bathing/ Shower/ Hygiene: May shower; gently cleanse wound with antibacterial soap, rinse and pat dry prior to dressing wounds No tub bath. Off-Loading: Roho cushion for wheelchair Turn and reposition every 2 hours Additional Orders / Instructions: Follow Nutritious Diet and Increase Protein Intake WOUND #1: - Sacrum Wound Laterality: Midline Cleanser: Soap and Water 1 x Per Day/30 Days Discharge Instructions: Gently cleanse wound with antibacterial soap, rinse and pat dry prior to dressing wounds Prim Dressing: Hydrofera Blue Ready Transfer Foam, 4x5 (in/in) (Dispense As Written) 1 x Per Day/30 Days ary Discharge Instructions: Apply Hydrofera Blue Ready to wound bed as directed Secondary Dressing: (BORDER) Zetuvit Plus SILICONE BORDER Dressing 4x4 (in/in) 1 x Per Day/30 Days Discharge Instructions: Please Michael not put silicone bordered dressings under wraps. Use non-bordered dressing only. When applying please make sure skin is pulled flat so dressing stays in place. 1. I am good recommend that we have the patient continue to monitor for any  signs of infection or worsening. Office if anything changes he can contact the office and let me know. 2. Will continue to utilize the [facility] I think this is probably the best option and really has not a wonderful thing that we can Michael to make this happened much more quickly. Patient is going to be I think time to get this to heal. We will see patient back for reevaluation in 3 weeks here in the clinic. If anything worsens or changes patient will contact our office for additional recommendations. Electronic Signature([name]) Signed: 06/07/2022 12:51:45 PM By: Worthy Keeler PA-C Entered By: Worthy Keeler on 06/07/2022 12:51:44 [name], Michael ([id number]) [id number] Page 7 of 7 -------------------------------------------------------------------------------- SuperBill Details Patient Name: Date of Service: [name] 06/07/2022 Medical Record Number: [id number] Patient Account Number: [id number] Date of Birth/Sex: Treating RN: Jun 03, 1956 (66 y.o. [name] Primary Care Provider: Leward Quan Other Clinician: Referring Provider: Treating Provider/Extender: Durwin Glaze in Treatment: 24 Diagnosis Coding ICD-10 Codes Code Description [phone] Pressure ulcer of sacral region, stage 3 G82.54 Quadriplegia, C5-C7 incomplete I48.0 Paroxysmal atrial fibrillation I95.89 Other hypotension Facility Procedures : CPT4 Code: [id number] Description: 99213 - WOUND CARE VISIT-LEV 3 EST PT Modifier: Quantity: 1 Physician Procedures : CPT4 Code Description Modifier [id number] 50093 - WC PHYS LEVEL 3 - EST PT ICD-10 Diagnosis Description L89.153 Pressure ulcer of sacral region, stage 3 G82.54 Quadriplegia, C5-C7 incomplete I48.0 Paroxysmal atrial fibrillation I95.89 Other hypotension Quantity: 1 Electronic Signature([name]) Signed: 06/07/2022 12:52:02 PM By: Worthy Keeler PA-C Entered By: Worthy Keeler on 06/07/2022 12:52:02

## 2022-06-07 NOTE — Progress Notes (Addendum)
[place], [name] ([id number]) [id number] Page 1 of 9 Visit Report for 06/07/2022 Arrival Information Details Patient Name: Date of Service: [name] 06/07/2022 11:30 A M Medical Record Number: [id number] Patient Account Number: [id number] Date of Birth/Sex: Treating RN: 1956/01/10 (66 y.o. [name] Primary Care [name]: [name] Other Clinician: Referring [name]: Treating [name]/Extender: [name] in Treatment: 24 Visit Information History Since Last Visit Added or deleted any medications: No Patient Arrived: Wheel Chair Any new allergies or adverse reactions: No Arrival Time: 11:46 Had a fall or experienced change in No Accompanied By: sister activities of daily living that may affect Transfer Assistance: [name] Lift risk of falls: Patient Requires Transmission-Based Precautions: No Hospitalized since last visit: No Patient Has Alerts: Yes Has Dressing in Place as Prescribed: Yes Patient Alerts: NOT DIABETIC Pain Present Now: No Electronic Signature([name]) Signed: 06/07/2022 3:51:33 PM By: Michael Loud MSN RN CNS WTA Entered By: [name] on 06/07/2022 11:47:06 -------------------------------------------------------------------------------- Clinic Level of Care Assessment Details Patient Name: Date of Service: [name] 06/07/2022 11:30 A M Medical Record Number: [id number] Patient Account Number: [id number] Date of Birth/Sex: Treating RN: 12-May-1956 (66 y.o. [name] Primary Care [name]: [name] Other Clinician: Referring [name]: Treating [name]/Extender: [name] in Treatment: 24 Clinic Level of Care Assessment Items TOOL 4 Quantity Score X- 1 0 Use when only an EandM is performed on FOLLOW-UP visit ASSESSMENTS - Nursing Assessment / Reassessment X- 1 10 Reassessment of Co-morbidities (includes updates in patient status) X- 1 5 Reassessment of Adherence to  Treatment Plan ASSESSMENTS - Wound and Skin A ssessment / Reassessment X - Simple Wound Assessment / Reassessment - one wound 1 5 [] - 0 Complex Wound Assessment / Reassessment - multiple wounds [facility], [name] ([id number]) [id number] Page 2 of 9 [] - 0 Dermatologic / Skin Assessment (not related to wound area) ASSESSMENTS - Focused Assessment [] - 0 Circumferential Edema Measurements - multi extremities [] - 0 Nutritional Assessment / Counseling / Intervention [] - 0 Lower Extremity Assessment (monofilament, tuning fork, pulses) [] - 0 Peripheral Arterial Disease Assessment (using hand held doppler) ASSESSMENTS - Ostomy and/or Continence Assessment and Care [] - 0 Incontinence Assessment and Management [] - 0 Ostomy Care Assessment and Management (repouching, etc.) PROCESS - Coordination of Care X - Simple Patient / Family Education for ongoing care 1 15 [] - 0 Complex (extensive) Patient / Family Education for ongoing care X- 1 10 Staff obtains Programmer, systems, Records, T Results / Process Orders est [] - 0 Staff telephones HHA, Nursing Homes / Clarify orders / etc [] - 0 Routine Transfer to another Facility (non-emergent condition) [] - 0 Routine Hospital Admission (non-emergent condition) [] - 0 New Admissions / Biomedical engineer / Ordering NPWT Apligraf, etc. , [] - 0 Emergency Hospital Admission (emergent condition) X- 1 10 Simple Discharge Coordination [] - 0 Complex (extensive) Discharge Coordination PROCESS - Special Needs [] - 0 Pediatric / Minor Patient Management [] - 0 Isolation Patient Management [] - 0 Hearing / Language / Visual special needs [] - 0 Assessment of Community assistance (transportation, D/C planning, etc.) [] - 0 Additional assistance / Altered mentation [] - 0 Support Surface([name]) Assessment (bed, cushion, seat, etc.) INTERVENTIONS - Wound Cleansing / Measurement X - Simple Wound Cleansing - one wound 1  5 [] - 0 Complex Wound Cleansing - multiple wounds X- 1 5 Wound Imaging (photographs - any number of wounds) [] -  0 Wound Tracing (instead of photographs) X- 1 5 Simple Wound Measurement - one wound [] - 0 Complex Wound Measurement - multiple wounds INTERVENTIONS - Wound Dressings X - Small Wound Dressing one or multiple wounds 1 10 [] - 0 Medium Wound Dressing one or multiple wounds [] - 0 Large Wound Dressing one or multiple wounds [] - 0 Application of Medications - topical [] - 0 Application of Medications - injection INTERVENTIONS - Miscellaneous [] - 0 External ear exam [] - 0 Specimen Collection (cultures, biopsies, blood, body fluids, etc.) [] - 0 Specimen([name]) / Culture([name]) sent or taken to Lab for analysis [facility], [name] ([id number]) [phone].pdf Page 3 of 9 [] - 0 Patient Transfer (multiple staff / Civil Service fast streamer / Similar devices) [] - 0 Simple Staple / Suture removal (25 or less) [] - 0 Complex Staple / Suture removal (26 or more) [] - 0 Hypo / Hyperglycemic Management (close monitor of Blood Glucose) [] - 0 Ankle / Brachial Index (ABI) - do not check if billed separately X- 1 5 Vital Signs Has the patient been seen at the hospital within the last three years: Yes Total Score: 85 Level Of Care: New/Established - Level 3 Electronic Signature([name]) Signed: 06/07/2022 3:51:33 PM By: Michael Loud MSN RN CNS WTA Entered By: [name] on 06/07/2022 11:51:22 -------------------------------------------------------------------------------- Complex / Palliative Patient Assessment Details Patient Name: Date of Service: [name] 06/07/2022 11:30 A M Medical Record Number: [id number] Patient Account Number: [id number] Date of Birth/Sex: Treating RN: 04/12/1956 (66 y.o. [name] Primary Care Provider: Leward [name] Other Clinician: Referring Provider: Treating Provider/Extender: [name] in Treatment: 24 Complex  Wound Management Criteria Patient has remarkable or complex co-morbidities requiring medications or treatments that extend wound healing times. Examples: Diabetes mellitus with chronic renal failure or end stage renal disease requiring dialysis Advanced or poorly controlled rheumatoid arthritis Diabetes mellitus and end stage chronic obstructive pulmonary disease Active cancer with current chemo- or radiation therapy Quadrapelgia Palliative Wound Management Criteria Care Approach Wound Care Plan: Complex Wound Management Electronic Signature([name]) Signed: 06/25/2022 3:53:05 PM By: Gretta Cool, BSN, RN, CWS, Kim RN, BSN Signed: 06/29/2022 1:28:04 PM By: Worthy Keeler PA-C Entered By: Gretta Cool, BSN, RN, CWS, Kim on 06/25/2022 15:53:05 Encounter Discharge Information Details -------------------------------------------------------------------------------- [name] ([id number]) [id number] Page 4 of 9 Patient Name: Date of Service: [name] 06/07/2022 11:30 A M Medical Record Number: [id number] Patient Account Number: [id number] Date of Birth/Sex: Treating RN: 01/20/1956 (66 y.o. [name] Primary Care Provider: Leward [name] Other Clinician: Referring Provider: Treating Provider/Extender: [name] in Treatment: 24 Encounter Discharge Information Items Discharge Condition: Stable Ambulatory Status: Wheelchair Discharge Destination: Home Transportation: Private Auto Accompanied By: sister Schedule Follow-up Appointment: Yes Clinical Summary of Care: Electronic Signature([name]) Signed: 06/07/2022 3:51:33 PM By: Michael Loud MSN RN CNS WTA Entered By: [name] on 06/07/2022 11:52:24 -------------------------------------------------------------------------------- Lower Extremity Assessment Details Patient Name: Date of Service: Michael [place], DO [name] 06/07/2022 11:30 A M Medical Record Number: [id number] Patient Account Number:  [id number] Date of Birth/Sex: Treating RN: [birth year] (66 y.o. [name] Primary Care Provider: Leward [name] Other Clinician: Referring Provider: Treating Provider/Extender: [name] in Treatment: 24 Electronic Signature([name]) Signed: 06/07/2022 3:51:33 PM By: Michael Loud MSN RN CNS WTA Entered By: [name] on 06/07/2022 12:19:01 -------------------------------------------------------------------------------- Multi Wound Chart Details Patient Name: Date of Service: Michael [place], DO UGLA [name] 06/07/2022 11:30 A M Medical Record  Number: [id number] Patient Account Number: [id number] Date of Birth/Sex: Treating RN: Oct 31, 1955 (66 y.o. [name] Primary Care Provider: Leward [name] Other Clinician: Referring Provider: Treating Provider/Extender: [name] in Treatment: 24 Vital Signs Height(in): 76 Pulse(bpm): 66 Weight(lbs): 167 Blood Pressure(mmHg): 92/66 Body Mass Index(BMI): 20.3 [place], Zamier ([id number]) [id number] Page 5 of 9 Temperature(F): 98.1 Respiratory Rate(breaths/min): 16 [1:Photos:] [N/A:N/A] Midline Sacrum N/A N/A Wound Location: Pressure Injury N/A N/A Wounding Event: Pressure Ulcer N/A N/A Primary Etiology: Arrhythmia, Hypotension, History of N/A N/A Comorbid History: pressure wounds, Quadriplegia 10/18/2021 N/A N/A Date Acquired: 24 N/A N/A Weeks of Treatment: Open N/A N/A Wound Status: No N/A N/A Wound Recurrence: 3x3x1.5 N/A N/A Measurements L x W x D (cm) 7.069 N/A N/A A (cm) : rea 10.603 N/A N/A Volume (cm) : -206.10% N/A N/A % Reduction in A rea: -2195.00% N/A N/A % Reduction in Volume: Category/Stage III N/A N/A Classification: Medium N/A N/A Exudate A mount: Serous N/A N/A Exudate Type: amber N/A N/A Exudate Color: Epibole N/A N/A Wound Margin: Large (67-100%) N/A N/A Granulation A mount: Red N/A N/A Granulation Quality: Small (1-33%) N/A N/A Necrotic A  mount: Fat Layer (Subcutaneous Tissue): Yes N/A N/A Exposed Structures: Fascia: No Tendon: No Muscle: No Joint: No Bone: No Small (1-33%) N/A N/A Epithelialization: Treatment Notes Electronic Signature([name]) Signed: 06/07/2022 3:51:33 PM By: Michael Loud MSN RN CNS WTA Entered By: [name] on 06/07/2022 11:50:18 -------------------------------------------------------------------------------- Multi-Disciplinary Care Plan Details Patient Name: Date of Service: [facility], DO UGLA [name] 06/07/2022 11:30 A M Medical Record Number: [id number] Patient Account Number: [id number] Date of Birth/Sex: Treating RN: 1955/11/13 (66 y.o. [name] Primary Care Provider: Leward [name] Other Clinician: Referring Provider: Treating Provider/Extender: [name] in Treatment: [address] [place], [place] ([id number]) [phone].pdf Page 6 of 9 Pressure Nursing Diagnoses: Knowledge deficit related to management of pressures ulcers Goals: Patient will remain free from development of additional pressure ulcers Date Initiated: 12/18/2021 Target Resolution Date: 05/19/2022 Goal Status: Active Patient will remain free of pressure ulcers Date Initiated: 12/18/2021 Target Resolution Date: 05/19/2022 Goal Status: Active Patient/caregiver will verbalize risk factors for pressure ulcer development Date Initiated: 12/18/2021 Target Resolution Date: 06/16/2022 Goal Status: Active Patient/caregiver will verbalize understanding of pressure ulcer management Date Initiated: 12/18/2021 Target Resolution Date: 05/19/2022 Goal Status: Active Interventions: Assess: immobility, friction, shearing, incontinence upon admission and as needed Assess offloading mechanisms upon admission and as needed Assess potential for pressure ulcer upon admission and as needed Provide education on pressure ulcers Treatment Activities: Patient referred for seating evaluation to ensure proper  offloading : 12/18/2021 Notes: Wound/Skin Impairment Nursing Diagnoses: Impaired tissue integrity Knowledge deficit related to smoking impact on wound healing Knowledge deficit related to ulceration/compromised skin integrity Goals: Patient/caregiver will verbalize understanding of skin care regimen Date Initiated: 12/18/2021 Date Inactivated: 01/25/2022 Target Resolution Date: 12/18/2021 Goal Status: Met Ulcer/skin breakdown will have a volume reduction of 30% by week 4 Date Initiated: 12/18/2021 Target Resolution Date: 05/19/2022 Goal Status: Active Interventions: Assess ulceration([name]) every visit Treatment Activities: Referred to DME provider for dressing supplies : 12/18/2021 Skin care regimen initiated : 12/18/2021 Topical wound management initiated : 12/18/2021 Notes: Electronic Signature([name]) Signed: 06/07/2022 3:51:33 PM By: Michael Loud MSN RN CNS WTA Entered By: [name] on 06/07/2022 11:51:44 -------------------------------------------------------------------------------- Pain Assessment Details Patient Name: Date of Service: Yolanda Manges, DO UGLA [name] 06/07/2022 11:30 A Despina Hidden, [place] ([id number]) [id number] Page 7 of 9 Medical Record Number: [id number] Patient Account Number: [id number] Date of  Birth/Sex: Treating RN: 01-04-1956 (66 y.o. [name] Primary Care Milan Perkins: [name] Other Clinician: Referring Krystie Leiter: Treating Jahi Roza/Extender: [name] in Treatment: 24 Active Problems Location of Pain Severity and Description of Pain Patient Has Paino No Site Locations Pain Management and Medication Current Pain Management: Electronic Signature([name]) Signed: 06/07/2022 3:51:33 PM By: Michael Loud MSN RN CNS WTA Entered By: [name] on 06/07/2022 11:47:37 -------------------------------------------------------------------------------- Patient/Caregiver Education Details Patient Name: Date of Service: Yolanda Manges, DO UGLA [name]  12/21/2023andnbsp11:30 A M Medical Record Number: [id number] Patient Account Number: [id number] Date of Birth/Gender: Treating RN: 1956-02-20 (66 y.o. [name] Primary Care Physician: [name] Other Clinician: Referring Physician: Treating Physician/Extender: [name] in Treatment: 24 Education Assessment Education Provided To: Patient Education Topics Provided Wound/Skin Impairment: Handouts: Caring for Your Ulcer Methods: Explain/Verbal Responses: State content correctly [place], [name] ([id number]) [id number] Page 8 of 9 Electronic Signature([name]) Signed: 06/07/2022 3:51:33 PM By: Michael Loud MSN RN CNS WTA Entered By: [name] on 06/07/2022 11:51:39 -------------------------------------------------------------------------------- Wound Assessment Details Patient Name: Date of Service: Michael [place], DO [name] 06/07/2022 11:30 A M Medical Record Number: [id number] Patient Account Number: [id number] Date of Birth/Sex: Treating RN: [birth year] (66 y.o. [name] Primary Care Karlin Heilman: [name] Other Clinician: Referring Naviyah Schaffert: Treating Tkeya Stencil/Extender: [name] in Treatment: 24 Wound Status Wound Number: 1 Primary Pressure Ulcer Etiology: Wound Location: Midline Sacrum Wound Status: Open Wounding Event: Pressure Injury Comorbid Arrhythmia, Hypotension, History of pressure wounds, Date Acquired: 10/18/2021 History: Quadriplegia Weeks Of Treatment: 24 Clustered Wound: No Photos Wound Measurements Length: (cm) 3 Width: (cm) 3 Depth: (cm) 1.5 Area: (cm) 7.069 Volume: (cm) 10.603 % Reduction in Area: -206.1% % Reduction in Volume: -2195% Epithelialization: Small (1-33%) Wound Description Classification: Category/Stage III Wound Margin: Epibole Exudate Amount: Medium Exudate Type: Serous Exudate Color: amber Foul Odor After Cleansing: No Slough/Fibrino Yes Wound Bed Granulation  Amount: Large (67-100%) Exposed Structure Granulation Quality: Red Fascia Exposed: No Necrotic Amount: Small (1-33%) Fat Layer (Subcutaneous Tissue) Exposed: Yes Necrotic Quality: Adherent Slough Tendon Exposed: No Muscle Exposed: No Joint Exposed: No Bone Exposed: No Electronic Signature([name]) [place], Ross ([id number]) [id number] Page 9 of 9 Signed: 06/07/2022 3:51:33 PM By: Michael Loud MSN RN CNS WTA Entered By: [name] on 06/07/2022 11:49:25 -------------------------------------------------------------------------------- Vitals Details Patient Name: Date of Service: Michael [place], DO UGLA [name] 06/07/2022 11:30 A M Medical Record Number: [id number] Patient Account Number: [id number] Date of Birth/Sex: Treating RN: 1955/07/11 (66 y.o. [name] Primary Care Ashante Yellin: [name] Other Clinician: Referring Gus Littler: Treating Beth Goodlin/Extender: [name] in Treatment: 24 Vital Signs Time Taken: 11:40 Temperature (F): 98.1 Height (in): 76 Pulse (bpm): 66 Weight (lbs): 167 Respiratory Rate (breaths/min): 16 Body Mass Index (BMI): 20.3 Blood Pressure (mmHg): 92/66 Reference Range: 80 - 120 mg / dl Electronic Signature([name]) Signed: 06/07/2022 3:51:33 PM By: Michael Loud MSN RN CNS WTA Entered By: [name] on 06/07/2022 11:47:31

## 2022-06-08 ENCOUNTER — Ambulatory Visit: Payer: Medicare (Managed Care)

## 2022-06-12 ENCOUNTER — Ambulatory Visit: Payer: Medicare (Managed Care)

## 2022-06-13 ENCOUNTER — Ambulatory Visit: Payer: Medicare (Managed Care)

## 2022-06-14 ENCOUNTER — Ambulatory Visit: Payer: Medicare (Managed Care)

## 2022-06-15 ENCOUNTER — Ambulatory Visit: Payer: Medicare (Managed Care)

## 2022-06-19 ENCOUNTER — Ambulatory Visit: Payer: Medicare (Managed Care)

## 2022-06-20 ENCOUNTER — Ambulatory Visit: Payer: Medicare (Managed Care)

## 2022-06-21 ENCOUNTER — Ambulatory Visit: Payer: Medicare (Managed Care)

## 2022-06-28 ENCOUNTER — Encounter: Payer: Medicare (Managed Care) | Attending: Physician Assistant | Admitting: Physician Assistant

## 2022-06-28 DIAGNOSIS — I9589 Other hypotension: Secondary | ICD-10-CM | POA: Insufficient documentation

## 2022-06-28 DIAGNOSIS — I480 Paroxysmal atrial fibrillation: Secondary | ICD-10-CM | POA: Diagnosis not present

## 2022-06-28 DIAGNOSIS — G8254 Quadriplegia, C5-C7 incomplete: Secondary | ICD-10-CM | POA: Diagnosis not present

## 2022-06-28 DIAGNOSIS — L89153 Pressure ulcer of sacral region, stage 3: Secondary | ICD-10-CM | POA: Insufficient documentation

## 2022-06-28 NOTE — Progress Notes (Addendum)
[place], [name] ([id number]) [id number] Page 1 of 7 Visit Report for 06/28/2022 Chief Complaint Document Details Patient Name: Date of Service: [name] 06/28/2022 12:30 PM Medical Record Number: [id number] Patient Account Number: [id number] Date of Birth/Sex: Treating RN: 1956-01-12 (67 y.o. [name] Primary Care Provider: Leward Quan Other Clinician: Referring Provider: Treating Provider/Extender: Durwin Glaze in Treatment: 27 Information Obtained from: Patient Chief Complaint Sacral pressure ulcer Electronic Signature(s) Signed: 06/28/2022 12:54:41 PM By: Worthy Keeler PA-C Entered By: Worthy Keeler on 06/28/2022 12:54:41 -------------------------------------------------------------------------------- HPI Details Patient Name: Date of Service: ENO [place], DO UGLA S 06/28/2022 12:30 PM Medical Record Number: [id number] Patient Account Number: [id number] Date of Birth/Sex: Treating RN: 23-Apr-1956 (67 y.o. [name] Primary Care Provider: Leward Quan Other Clinician: Referring Provider: Treating Provider/Extender: Durwin Glaze in Treatment: 27 History of Present Illness HPI Description: 12-18-2021 upon evaluation today patient presents for initial evaluation here in the clinic concerning an issue he has been having over the sacral area where he has a wound this is a stage III pressure ulcer. It also appears there may still be some moisture component to this based on what I am seeing. I do not see any evidence of infection but at the same time I do believe that the patient is in need of some additional and hopefully better dressings in order to help this heal more appropriately and quickly. Currently just a protective dressing has been used over this sounds like may possibly be a border foam. The patient did have a CT scan which was negative for osteomyelitis he had a previous wound above this in the sacral region  but not necessarily right of this area. 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 He also has atrial fibrillation and hypotension. 01-01-2022 upon evaluation today patient appears to be doing well with regard to his wound actually see some signs of improvement there is still quite a bit of feeling to be done here but nonetheless I think we are on the right track. I do not see any evidence of active infection locally or systemically at this time which is great news. 01-25-2022 upon evaluation today patient's wound really is not significantly better it actually appears about the same. Of note I do believe that part of the issue here is probably the fact that honestly it is hard to keep the dressing in place I think that when the alginate is put on it needs to be cut bigger than what is currently being seen on the dressing today probably by about 3 times that size and placed directly to the wound bed while spreading the butt cheeks bilaterally. This will allow for the dressing to be right intact with the wound bed so that when it does try to closing on itself it will actually fold on the alginate and not [facility], [place] ([id number]) [id number] Page 2 of 7 actually contact skin to skin. 02-09-2022 upon evaluation today patient appears to be doing about the same in regard to his wound. Again I do not think the dressings being quite put on correctly keeping the alginate in contact with the wound bed this just does not seem to be quite doing the job. I think we may need to switch to Dakin's moistened gauze dressing this is a very precarious location I am not even sure that he is not getting some feces into this dressing and underneath. I think it  is probably going to be changed daily and switch again to the Dakin's. 02-23-2022 upon evaluation today patient unfortunately still continues to have issues with his sacral area. Again this is a very  precarious spot but honestly I think is made worse by the fact that he is up sitting the majority of the day. He typically gets up by 10 he tells me and does not go to bed until around 11 or 12 this is a good solid at least 12 to 14 hours that he is up day in and day out. With that being said I think that this is probably one of the main reasons why this wound is not healing the other being obviously moisture control I do think the surface of the wound looks better this week compared to previous with the Dakin's but at the same time the wound itself does not appear to be any smaller. 03-12-2022 upon evaluation today patient appears to be doing poorly currently in regard to the wound and sacral region. Unfortunately this is not showing signs of improvement and he actually has a small area of deep tissue injury right in the middle right at the point where the bone of the coccyx is pushing onto the wound bed. Again I think that this is likely end up opening up into a much deeper wound that extends down to bone if he is unable to get off of this. So far he has not been able to really do this effectively tells me that he has been "trying" but only succeeding minimally. 03-27-2022 upon evaluation today patient appears to be doing a little better in regard to his wound compared to last time I saw him. At that point he started to have some breakdown of the central portion of the wound the good news is that has improved. 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 04-09-2022 upon evaluation today patient's wound is actually showing signs of doing okay. Its not significantly smaller and is also not any larger nor showing signs of breaking down. He tells me he is still trying to stay off of this on the weekends. He also tells me during the week he is going to the pace program more frequently 3 times a  week at this point. With that being said overall I am pleased in that regard and I am happy that he is doing somewhat better. With that being said I explained to the patient as well that I do believe he still needs to be offloading is much as he can. 05-03-2022 upon evaluation today patient appears to be doing well currently in regard to his wound from the standpoint of it not being infected I do not see anything obvious at this point which is great news. Fortunately I do not see any signs of active infection locally or systemically at this well which is also great news. Overall I do believe that the biggest issue simply is just that he is going to have a lot of trouble getting this to heal simply due to the fact that he has a lot of issues getting off of this routinely and even so it is a very difficult location even in the best of circumstances. Again its not deep enough for wound VAC right now. Hopefully will not get to that point. 05-24-2022 upon evaluation today patient actually does have some signs  of new skin growth around the edges of the wound. This was very subtle but nonetheless does seem to be doing better compared to what I previously seen. I think that the wound looks clean and does not appear to be trying to get any deeper which is good news. He does tell me that he is nearing the completion of his radiation therapy. That is for the prostate. 06-07-2022 upon evaluation today patient appears to be doing well currently in regard to his wound there does appear to be more granulation tissue which is healthy showing up at this point. Fortunately I do not see any signs of active infection at this time. No fevers, chills, nausea, vomiting, or diarrhea. 06-28-2022 upon evaluation today patient appears to be doing really about the same if not even a little bit worse in regard to his wound. Unfortunately I do not see where this is infected but also do not see where this is showing signs of  improvement. I do believe that we need to do something to try to get things moving in a better direction. Again I am good to make a switch in the dressing today just to see how this will do I think may be going to a silver cell dressing would be a better option currently. With that being said I am also going to recommend based on what we are seeing that we may want to get him into a plastic surgeon in order to see if there is anything that could be done surgically to close this wound. Obviously this is a very difficult spot in a very difficult location but nonetheless I think the sooner we get it healed the less of a chance of infection or anything breaking down and getting worse he would have. The patient voiced understanding I am getting in touch with Marchia Bond, MD as well who is his provider at the pace program in order to see her opinion and what we can do to expedite this referral. Electronic Signature(s) Signed: 06/28/2022 1:33:12 PM By: Worthy Keeler PA-C Entered By: Worthy Keeler on 06/28/2022 13:33:12 -------------------------------------------------------------------------------- Physical Exam Details Patient Name: Date of Service: ENO [place], DO UGLA S 06/28/2022 12:30 PM Medical Record Number: [id number] Patient Account Number: [id number] Date of Birth/Sex: Treating RN: [birth year] (67 y.o. [name] Primary Care Provider: Leward Quan Other Clinician: Referring Provider: Treating Provider/Extender: Durwin Glaze in Treatment: 47 Constitutional Well-nourished and well-hydrated in no acute distress. Respiratory normal breathing without difficulty. [facility] [place], [place] ([id number]) [id number] Page 3 of 7 this patient is able to make decisions and demonstrates good insight into disease process. Alert and Oriented x 3. pleasant and cooperative. Notes Upon inspection patient's wound bed actually showed signs of good granulation and  epithelization at this point. Fortunately I do not see any signs of active infection locally nor systemically which is great news. Upon inspection patient's wound bed actually showed signs of good granulation and epithelization at this point. Fortunately I do not see any signs of active infection locally nor systemically which is great news. No fevers, chills, nausea, vomiting, or diarrhea. Electronic Signature(s) Signed: 06/28/2022 1:34:46 PM By: Worthy Keeler PA-C Entered By: Worthy Keeler on 06/28/2022 13:34:45 -------------------------------------------------------------------------------- Physician Orders Details Patient Name: Date of Service: ENO [place], DO UGLA S 06/28/2022 12:30 PM Medical Record Number: [id number] Patient Account Number: [id number] Date of Birth/Sex: Treating RN: 11/16/1955 (67 y.o. [name] Primary Care Provider: Leward Quan Other Clinician: Referring Provider:  Treating Provider/Extender: Durwin Glaze in Treatment: 27 Verbal / Phone Orders: No Diagnosis Coding ICD-10 Coding Code Description L89.153 Pressure ulcer of sacral region, stage 3 G82.54 Quadriplegia, C5-C7 incomplete I48.0 Paroxysmal atrial fibrillation I95.89 Other hypotension Follow-up Appointments Wound #1 Midline Sacrum Return Appointment in 2 weeks. Home Health Wound #1 [place]: - [place] for wound care. May utilize formulary equivalent dressing for wound treatment orders unless otherwise specified. Home Health Nurse may visit PRN to address patients wound care needs. Scheduled days for dressing changes to be completed; exception, patient has scheduled wound care visit that day. **Please direct any NON-WOUND related issues/requests for orders to patient's Primary Care Physician. **If current dressing causes regression in wound condition, may D/C ordered dressing product/s and apply Normal Saline Moist Dressing daily until next  [place] or Other MD appointment. **Notify [facility] of regression in wound condition at [phone]. Bathing/ Shower/ Hygiene May shower; gently cleanse wound with antibacterial soap, rinse and pat dry prior to dressing wounds No tub bath. Off-Loading Roho cushion for wheelchair Turn and reposition every 2 hours Additional Orders / Instructions Follow Nutritious Diet and Increase Protein Intake Wound Treatment Wound #1 - Sacrum Wound Laterality: Midline Cleanser: Soap and Water 1 x Per Day/30 Days Discharge Instructions: Gently cleanse wound with antibacterial soap, rinse and pat dry prior to dressing wounds Prim Dressing: Hydrofera Blue Ready Transfer Foam, 4x5 (in/in) (Dispense As Written) 1 x Per Day/30 Days [place], [place] ([id number]) [id number] Page 4 of 7 Discharge Instructions: Apply Hydrofera Blue Ready to wound bed as directed Secondary Dressing: (BORDER) Zetuvit Plus SILICONE BORDER Dressing 4x4 (in/in) 1 x Per Day/30 Days Discharge Instructions: Please do not put silicone bordered dressings under wraps. Use non-bordered dressing only. When applying please make sure skin is pulled flat so dressing stays in place. Electronic Signature(s) Signed: 06/28/2022 5:05:26 PM By: Worthy Keeler PA-C Signed: 06/28/2022 5:09:02 PM By: Rosalio Loud MSN RN CNS WTA Entered By: Rosalio Loud on 06/28/2022 13:03:36 -------------------------------------------------------------------------------- Problem List Details Patient Name: Date of Service: [facility], DO UGLA S 06/28/2022 12:30 PM Medical Record Number: [id number] Patient Account Number: [id number] Date of Birth/Sex: Treating RN: 1955/11/22 (67 y.o. [name] Primary Care Provider: Leward Quan Other Clinician: Referring Provider: Treating Provider/Extender: Durwin Glaze in Treatment: 27 Active Problems ICD-10 Encounter Code Description Active Date  MDM Diagnosis L89.153 Pressure ulcer of sacral region, stage 3 12/18/2021 No Yes G82.54 Quadriplegia, C5-C7 incomplete 12/18/2021 No Yes I48.0 Paroxysmal atrial fibrillation 12/18/2021 No Yes I95.89 Other hypotension 12/18/2021 No Yes Inactive Problems Resolved Problems Electronic Signature(s) Signed: 06/28/2022 12:54:37 PM By: Worthy Keeler PA-C Entered By: Worthy Keeler on 06/28/2022 12:54:36 [place], [place] ([id number]) [id number] Page 5 of 7 -------------------------------------------------------------------------------- Progress Note Details Patient Name: Date of Service: [name] 06/28/2022 12:30 PM Medical Record Number: [id number] Patient Account Number: [id number] Date of Birth/Sex: Treating RN: Nov 10, 1955 (67 y.o. [name] Primary Care Provider: Leward Quan Other Clinician: Referring Provider: Treating Provider/Extender: Durwin Glaze in Treatment: 27 Subjective Chief Complaint Information obtained from Patient Sacral pressure ulcer History of Present Illness (HPI) 12-18-2021 upon evaluation today patient presents for initial evaluation here in the clinic concerning an issue he has been having over the sacral area where he has a wound this is a stage III pressure ulcer. It also appears there may still be some moisture component to this based on what  I am seeing. I do not see any evidence of infection but at the same time I do believe that the patient is in need of some additional and hopefully better dressings in order to help this heal more appropriately and quickly. Currently just a protective dressing has been used over this sounds like may possibly be a border foam. The patient did have a CT scan which was negative for osteomyelitis he had a previous wound above this in the sacral region but not necessarily right of this area. 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 He also has atrial  fibrillation and hypotension. 01-01-2022 upon evaluation today patient appears to be doing well with regard to his wound actually see some signs of improvement there is still quite a bit of feeling to be done here but nonetheless I think we are on the right track. I do not see any evidence of active infection locally or systemically at this time which is great news. 01-25-2022 upon evaluation today patient's wound really is not significantly better it actually appears about the same. Of note I do believe that part of the issue here is probably the fact that honestly it is hard to keep the dressing in place I think that when the alginate is put on it needs to be cut bigger than what is currently being seen on the dressing today probably by about 3 times that size and placed directly to the wound bed while spreading the butt cheeks bilaterally. This will allow for the dressing to be right intact with the wound bed so that when it does try to closing on itself it will actually fold on the alginate and not actually contact skin to skin. 02-09-2022 upon evaluation today patient appears to be doing about the same in regard to his wound. Again I do not think the dressings being quite put on correctly keeping the alginate in contact with the wound bed this just does not seem to be quite doing the job. I think we may need to switch to Dakin's moistened gauze dressing this is a very precarious location I am not even sure that he is not getting some feces into this dressing and underneath. I think it is probably going to be changed daily and switch again to the Dakin's. 02-23-2022 upon evaluation today patient unfortunately still continues to have issues with his sacral area. Again this is a very precarious spot but honestly I think is made worse by the fact that he is up sitting the majority of the day. He typically gets up by 10 he tells me and does not go to bed until around 11 or 12 this is a good solid at least  12 to 14 hours that he is up day in and day out. With that being said I think that this is probably one of the main reasons why this wound is not healing the other being obviously moisture control I do think the surface of the wound looks better this week compared to previous with the Dakin's but at the same time the wound itself does not appear to be any smaller. 03-12-2022 upon evaluation today patient appears to be doing poorly currently in regard to the wound and sacral region. Unfortunately this is not showing signs of improvement and he actually has a small area of deep tissue injury right in the middle right at the point where the bone of the coccyx is pushing onto the wound bed.  Again I think that this is likely end up opening up into a much deeper wound that extends down to bone if he is unable to get off of this. So far he has not been able to really do this effectively tells me that he has been "trying" but only succeeding minimally. 03-27-2022 upon evaluation today patient appears to be doing a little better in regard to his wound compared to last time I saw him. At that point he started to have some breakdown of the central portion of the wound the good news is that has improved. 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 04-09-2022 upon evaluation today patient's wound is actually showing signs of doing okay. Its not significantly smaller and is also not any larger nor showing signs of breaking down. He tells me he is still trying to stay off of this on the weekends. He also tells me during the week he is going to the pace program more frequently 3 times a week at this point. With that being said overall I am pleased in that regard and I am happy that he is doing somewhat better. With that being said I explained to the patient as well that I do believe he still needs to be  offloading is much as he can. 05-03-2022 upon evaluation today patient appears to be doing well currently in regard to his wound from the standpoint of it not being infected I do not see anything obvious at this point which is great news. Fortunately I do not see any signs of active infection locally or systemically at this well which is also great news. Overall I do believe that the biggest issue simply is just that he is going to have a lot of trouble getting this to heal simply due to the fact that he has a lot of issues getting off of this routinely and even so it is a very difficult location even in the best of circumstances. Again its not deep enough for wound VAC right now. Hopefully will not get to that point. 05-24-2022 upon evaluation today patient actually does have some signs of new skin growth around the edges of the wound. This was very subtle but nonetheless does seem to be doing better compared to what I previously seen. I think that the wound looks clean and does not appear to be trying to get any deeper which is good news. He does tell me that he is nearing the completion of his radiation therapy. That is for the prostate. 06-07-2022 upon evaluation today patient appears to be doing well currently in regard to his wound there does appear to be more granulation tissue which is healthy showing up at this point. Fortunately I do not see any signs of active infection at this time. No fevers, chills, nausea, vomiting, or diarrhea. 06-28-2022 upon evaluation today patient appears to be doing really about the same if not even a little bit worse in regard to his wound. Unfortunately I do not see where this is infected but also do not see where this is showing signs of improvement. I do believe that we need to do something to try to get things [facility], [name] ([id number]) [id number] Page 6 of 7 moving in a better direction. Again I am good to make a switch in the  dressing today just to see how this will do  I think may be going to a silver cell dressing would be a better option currently. With that being said I am also going to recommend based on what we are seeing that we may want to get him into a plastic surgeon in order to see if there is anything that could be done surgically to close this wound. Obviously this is a very difficult spot in a very difficult location but nonetheless I think the sooner we get it healed the less of a chance of infection or anything breaking down and getting worse he would have. The patient voiced understanding I am getting in touch with Marchia Bond, MD as well who is his provider at the pace program in order to see her opinion and what we can do to expedite this referral. Objective Constitutional Well-nourished and well-hydrated in no acute distress. Vitals Time Taken: 1:10 PM, Height: 76 in, Weight: 167 lbs, BMI: 20.3, Temperature: 98.3 F, Pulse: 56 bpm, Respiratory Rate: 16 breaths/min, Blood Pressure: 93/74 mmHg. Respiratory normal breathing without difficulty. Psychiatric this patient is able to make decisions and demonstrates good insight into disease process. Alert and Oriented x 3. pleasant and cooperative. General Notes: Upon inspection patient's wound bed actually showed signs of good granulation and epithelization at this point. Fortunately I do not see any signs of active infection locally nor systemically which is great news. Upon inspection patient's wound bed actually showed signs of good granulation and epithelization at this point. Fortunately I do not see any signs of active infection locally nor systemically which is great news. No fevers, chills, nausea, vomiting, or diarrhea. Integumentary (Hair, Skin) Wound #1 status is Open. Original cause of wound was Pressure Injury. The date acquired was: 10/18/2021. The wound has been in treatment 27 weeks. The wound is located on the Midline Sacrum. The wound  measures 6cm length x 3cm width x 2.2cm depth; 14.137cm^2 area and 31.102cm^3 volume. There is Fat Layer (Subcutaneous Tissue) exposed. There is a medium amount of serous drainage noted. The wound margin is epibole. There is large (67-100%) red granulation within the wound bed. There is a small (1-33%) amount of necrotic tissue within the wound bed including Adherent Slough. Assessment Active Problems ICD-10 Pressure ulcer of sacral region, stage 3 Quadriplegia, C5-C7 incomplete Paroxysmal atrial fibrillation Other hypotension Plan Follow-up Appointments: Wound #1 Midline Sacrum: Return Appointment in 2 weeks. Home Health: Wound #1 Midline Sacrum: [place]: - [place] for wound care. May utilize formulary equivalent dressing for wound treatment orders unless otherwise specified. Home Health Nurse may visit PRN to address patientoos wound care needs. Scheduled days for dressing changes to be completed; exception, patient has scheduled wound care visit that day. **Please direct any NON-WOUND related issues/requests for orders to patient's Primary Care Physician. **If current dressing causes regression in wound condition, may D/C ordered dressing product/s and apply Normal Saline Moist Dressing daily until next [place] or Other MD appointment. **Notify [facility] of regression in wound condition at [phone]. Bathing/ Shower/ Hygiene: May shower; gently cleanse wound with antibacterial soap, rinse and pat dry prior to dressing wounds No tub bath. Off-Loading: Roho cushion for wheelchair Turn and reposition every 2 hours Additional Orders / Instructions: Follow Nutritious Diet and Increase Protein Intake WOUND #1: - Sacrum Wound Laterality: Midline Cleanser: Soap and Water 1 x Per Day/30 Days Discharge Instructions: Gently cleanse wound with antibacterial soap, rinse and pat dry prior to dressing wounds Prim Dressing: Hydrofera Blue  Ready  Transfer Foam, 4x5 (in/in) (Dispense As Written) 1 x Per Day/30 Days ary Discharge Instructions: Apply Hydrofera Blue Ready to wound bed as directed Secondary Dressing: (BORDER) Zetuvit Plus SILICONE BORDER Dressing 4x4 (in/in) 1 x Per Day/30 Days [facility], Kareen ([id number]) [id number] Page 7 of 7 Discharge Instructions: Please do not put silicone bordered dressings under wraps. Use non-bordered dressing only. When applying please make sure skin is pulled flat so dressing stays in place. 1. Based on what I am seeing I do believe that the patient would benefit from a second opinion with a plastic surgeon who is also specialized in wound care. I would recommend making an appointment with either Dr. Jaclynn Guarneri or Dr. Vernona Rieger at [facility]. Both are excellent surgeons and I think either could serve the patient well as far as determining whether a plastic surgery option is available here or at least getting a second opinion on any additional wound care measures that could be attempted. 2. I am also can recommend based on what we are seeing that we have the patient continue with the Zetuvit bordered foam dressing although we are going to switch to a silver alginate dressing to cover. 3. I would also suggest that the patient should continue to appropriate offload. My hope is that we will see some improvement with the switch to silver alginate but at the same time again I think that we may at least need to evaluate the feasibility of a plastic surgery intervention. We will see patient back for reevaluation in 1 week here in the clinic. If anything worsens or changes patient will contact our office for additional recommendations. Electronic Signature(s) Signed: 06/28/2022 1:35:57 PM By: Worthy Keeler PA-C Entered By: Worthy Keeler on 06/28/2022 13:35:57 -------------------------------------------------------------------------------- SuperBill Details Patient Name: Date of  Service: ENO [place]ad[place], DO UGLA S 06/28/2022 Medical Record Number: [id number] Patient Account Number: [id number] Date of Birth/Sex: Treating RN: 11/25/1955 (67 y.o. [name] Primary Care Provider: Leward Quan Other Clinician: Referring Provider: Treating Provider/Extender: Durwin Glaze in Treatment: 27 Diagnosis Coding ICD-10 Codes Code Description [phone] Pressure ulcer of sacral region, stage 3 G82.54 Quadriplegia, C5-C7 incomplete I48.0 Paroxysmal atrial fibrillation I95.89 Other hypotension Facility Procedures : CPT4 Code: [id number] Description: 99213 - WOUND CARE VISIT-LEV 3 EST PT Modifier: Quantity: 1 Physician Procedures : CPT4 Code Description Modifier [id number] - WC PHYS LEVEL 4 - EST PT ICD-10 Diagnosis Description L89.153 Pressure ulcer of sacral region, stage 3 G82.54 Quadriplegia, C5-C7 incomplete I48.0 Paroxysmal atrial fibrillation I95.89 Other hypotension Quantity: 1 Electronic Signature(s) Signed: 06/28/2022 1:36:11 PM By: Worthy Keeler PA-C Entered By: Worthy Keeler on 06/28/2022 13:36:11

## 2022-06-28 NOTE — Progress Notes (Addendum)
**Note [name]-Identified via Obfuscation** [name], [name] ([id number]) [id number] Page 1 of 8 Visit Report for 06/28/2022 Arrival Information Details Patient Name: Date of Service: [name] 06/28/2022 12:30 PM Medical Record Number: [id number] Patient Account Number: [id number] Date of Birth/Sex: Treating RN: [birth year] (67 y.o. [name] Primary Care [name]: [name] Other Clinician: Referring [name]: Treating [name]/Extender: [name] in Treatment: 27 Visit Information History Since Last Visit Added or deleted any medications: No Patient Arrived: Wheel Chair Any new allergies or adverse reactions: No Arrival Time: 12:47 Had a fall or experienced change in No Accompanied By: caretaker activities of daily living that may affect Transfer Assistance: [name] Lift risk of falls: Patient Identification Verified: Yes Hospitalized since last visit: No Secondary Verification Process Completed: Yes Has Dressing in Place as Prescribed: Yes Patient Requires Transmission-Based Precautions: No Has Compression in Place as Prescribed: Yes Patient Has Alerts: Yes Pain Present Now: No Patient Alerts: NOT DIABETIC Electronic Signature([name]) Signed: 06/28/2022 5:09:02 PM By: [name] Entered By: [name] on 06/28/2022 13:25:10 -------------------------------------------------------------------------------- Clinic Level of Care Assessment Details Patient Name: Date of Service: [name], DO [name] 06/28/2022 12:30 PM Medical Record Number: [id number] Patient Account Number: [id number] Date of Birth/Sex: Treating RN: 08/14/1955 (67 y.o. [name] Primary Care Shanyn Preisler: [name] Other Clinician: Referring Avagrace Botelho: Treating Siera Beyersdorf/Extender: [name] in Treatment: 27 Clinic Level of Care Assessment Items TOOL 4 Quantity Score X- 1 0 Use when only an EandM is performed on FOLLOW-UP visit ASSESSMENTS - Nursing Assessment /  Reassessment X- 1 10 Reassessment of Co-morbidities (includes updates in patient status) X- 1 5 Reassessment of Adherence to Treatment Plan ASSESSMENTS - Wound and Skin A ssessment / Reassessment X - Simple Wound Assessment / Reassessment - one wound 1 5 [name] ([id number]) [id number] Page 2 of 8 '[]'$  - 0 Complex Wound Assessment / Reassessment - multiple wounds '[]'$  - 0 Dermatologic / Skin Assessment (not related to wound area) ASSESSMENTS - Focused Assessment '[]'$  - 0 Circumferential Edema Measurements - multi extremities '[]'$  - 0 Nutritional Assessment / Counseling / Intervention '[]'$  - 0 Lower Extremity Assessment (monofilament, tuning fork, pulses) '[]'$  - 0 Peripheral Arterial Disease Assessment (using hand held doppler) ASSESSMENTS - Ostomy and/or Continence Assessment and Care '[]'$  - 0 Incontinence Assessment and Management '[]'$  - 0 Ostomy Care Assessment and Management (repouching, etc.) PROCESS - Coordination of Care X - Simple Patient / Family Education for ongoing care 1 15 '[]'$  - 0 Complex (extensive) Patient / Family Education for ongoing care X- 1 10 Staff obtains Programmer, systems, Records, T Results / Process Orders est '[]'$  - 0 Staff telephones HHA, Nursing Homes / Clarify orders / etc '[]'$  - 0 Routine Transfer to another Facility (non-emergent condition) '[]'$  - 0 Routine Hospital Admission (non-emergent condition) '[]'$  - 0 New Admissions / Biomedical engineer / Ordering NPWT Apligraf, etc. , '[]'$  - 0 Emergency Hospital Admission (emergent condition) X- 1 10 Simple Discharge Coordination '[]'$  - 0 Complex (extensive) Discharge Coordination PROCESS - Special Needs '[]'$  - 0 Pediatric / Minor Patient Management '[]'$  - 0 Isolation Patient Management '[]'$  - 0 Hearing / Language / Visual special needs '[]'$  - 0 Assessment of Community assistance (transportation, D/C planning, etc.) '[]'$  - 0 Additional assistance / Altered mentation '[]'$  - 0 Support Surface([name])  Assessment (bed, cushion, seat, etc.) INTERVENTIONS - Wound Cleansing / Measurement X - Simple Wound Cleansing - one wound 1 5 '[]'$  - 0 Complex Wound Cleansing - multiple  wounds X- 1 5 Wound Imaging (photographs - any number of wounds) '[]'$  - 0 Wound Tracing (instead of photographs) X- 1 5 Simple Wound Measurement - one wound '[]'$  - 0 Complex Wound Measurement - multiple wounds INTERVENTIONS - Wound Dressings '[]'$  - 0 Small Wound Dressing one or multiple wounds X- 1 15 Medium Wound Dressing one or multiple wounds '[]'$  - 0 Large Wound Dressing one or multiple wounds '[]'$  - 0 Application of Medications - topical '[]'$  - 0 Application of Medications - injection INTERVENTIONS - Miscellaneous '[]'$  - 0 External ear exam '[]'$  - 0 Specimen Collection (cultures, biopsies, blood, body fluids, etc.) '[]'$  - 0 Specimen([name]) / Culture([name]) sent or taken to Lab for analysis [facility], [name] ([id number]) [id number].pdf Page 3 of 8 '[]'$  - 0 Patient Transfer (multiple staff / Civil Service fast streamer / Similar devices) '[]'$  - 0 Simple Staple / Suture removal (25 or less) '[]'$  - 0 Complex Staple / Suture removal (26 or more) '[]'$  - 0 Hypo / Hyperglycemic Management (close monitor of Blood Glucose) '[]'$  - 0 Ankle / Brachial Index (ABI) - do not check if billed separately X- 1 5 Vital Signs Has the patient been seen at the hospital within the last three years: Yes Total Score: 90 Level Of Care: New/Established - Level 3 Electronic Signature([name]) Signed: 06/28/2022 5:09:02 PM By: [name] Entered By: [name] on 06/28/2022 13:26:50 -------------------------------------------------------------------------------- Encounter Discharge Information Details Patient Name: Date of Service: Michael [place], DO [name] 06/28/2022 12:30 PM Medical Record Number: [id number] Patient Account Number: [id number] Date of Birth/Sex: Treating RN: 1955/10/07 (67 y.o. [name] Primary Care Shaquanda Graves: [name] Other  Clinician: Referring [name]: Treating Demaryius Imran/Extender: [name] in Treatment: 27 Encounter Discharge Information Items Discharge Condition: Stable Ambulatory Status: Wheelchair Discharge Destination: Home Transportation: Private Auto Accompanied By: self Schedule Follow-up Appointment: No Clinical Summary of Care: Electronic Signature([name]) Signed: 06/28/2022 5:09:02 PM By: [name] Entered By: [name] on 06/28/2022 13:27:49 -------------------------------------------------------------------------------- Lower Extremity Assessment Details Patient Name: Date of Service: Michael [place], DO [name] 06/28/2022 12:30 PM Medical Record Number: [id number] Patient Account Number: [id number] Date of Birth/Sex: Treating RN: 1956/03/04 (67 y.o. [name] Primary Care Madeline Pho: [name] Other Clinician: [name] ([id number]) [id number] Page 4 of 8 Referring Anan Dapolito: Treating Aeris Hersman/Extender: [name] in Treatment: 27 Electronic Signature([name]) Signed: 06/28/2022 5:09:02 PM By: [name] Entered By: [name] on 06/28/2022 13:25:54 -------------------------------------------------------------------------------- Multi Wound Chart Details Patient Name: Date of Service: Michael [place], DO [name] 06/28/2022 12:30 PM Medical Record Number: [id number] Patient Account Number: [id number] Date of Birth/Sex: Treating RN: Apr 30, 1956 (67 y.o. [name] Primary Care Carlen Fils: [name] Other Clinician: Referring Guillaume Weninger: Treating Zeric Baranowski/Extender: [name] in Treatment: 33 [1:Photos:] [N/A:N/A] Midline Sacrum N/A N/A Wound Location: Pressure Injury N/A N/A Wounding Event: Pressure Ulcer N/A N/A Primary Etiology: Arrhythmia, Hypotension, History of N/A N/A Comorbid History: pressure wounds, Quadriplegia 10/18/2021 N/A N/A Date Acquired: 89 N/A N/A Weeks of  Treatment: Open N/A N/A Wound Status: No N/A N/A Wound Recurrence: 6x3x2.2 N/A N/A Measurements L x W x D (cm) 14.137 N/A N/A A (cm) : rea 31.102 N/A N/A Volume (cm) : -512.30% N/A N/A % Reduction in A rea: -6632.00% N/A N/A % Reduction in Volume: Category/Stage III N/A N/A Classification: Medium N/A N/A Exudate A mount: Serous N/A N/A Exudate Type: amber N/A N/A Exudate Color: Epibole N/A N/A Wound  Margin: Large (67-100%) N/A N/A Granulation A mount: Red N/A N/A Granulation Quality: Small (1-33%) N/A N/A Necrotic A mount: Fat Layer (Subcutaneous Tissue): Yes N/A N/A Exposed Structures: Fascia: No Tendon: No Muscle: No Joint: No Bone: No Small (1-33%) N/A N/A Epithelialization: Treatment Notes Electronic Signature([name]) Signed: 06/28/2022 5:09:02 PM By: [name] Entered By: [name] on 06/28/2022 13:02:17 Pechacek, [place] ([id number]) [id number] Page 5 of 8 -------------------------------------------------------------------------------- Multi-Disciplinary Care Plan Details Patient Name: Date of Service: [name] 06/28/2022 12:30 PM Medical Record Number: [id number] Patient Account Number: [id number] Date of Birth/Sex: Treating RN: [birth year] (67 y.o. [name] Primary Care Twylia Oka: [name] Other Clinician: Referring Dervin Vore: Treating Foy Vanduyne/Extender: [name] in Treatment: 27 Active Inactive Pressure Nursing Diagnoses: Knowledge deficit related to management of pressures ulcers Goals: Patient will remain free from development of additional pressure ulcers Date Initiated: 12/18/2021 Target Resolution Date: 07/18/2022 Goal Status: Active Patient will remain free of pressure ulcers Date Initiated: 12/18/2021 Target Resolution Date: 07/21/2022 Goal Status: Active Patient/caregiver will verbalize risk factors for pressure ulcer development Date Initiated: 12/18/2021 Target  Resolution Date: 07/29/2022 Goal Status: Active Patient/caregiver will verbalize understanding of pressure ulcer management Date Initiated: 12/18/2021 Date Inactivated: 06/28/2022 Target Resolution Date: 05/19/2022 Goal Status: Met Interventions: Assess: immobility, friction, shearing, incontinence upon admission and as needed Assess offloading mechanisms upon admission and as needed Assess potential for pressure ulcer upon admission and as needed Provide education on pressure ulcers Treatment Activities: Patient referred for seating evaluation to ensure proper offloading : 12/18/2021 Notes: Electronic Signature([name]) Signed: 06/28/2022 5:09:02 PM By: [name] Entered By: [name] on 06/28/2022 13:27:05 Dorce, Ayden ([id number]) [id number] Page 6 of 8 -------------------------------------------------------------------------------- Pain Assessment Details Patient Name: Date of Service: [name] 06/28/2022 12:30 PM Medical Record Number: [id number] Patient Account Number: [id number] Date of Birth/Sex: Treating RN: 01/22/1956 (67 y.o. [name] Primary Care Tailer Volkert: [name] Other Clinician: Referring Li Fragoso: Treating Christabell Loseke/Extender: [name] in Treatment: 27 Active Problems Location of Pain Severity and Description of Pain Patient Has Paino No Site Locations Pain Management and Medication Current Pain Management: Electronic Signature([name]) Signed: 06/28/2022 5:09:02 PM By: [name] Entered By: [name] on 06/28/2022 13:25:46 -------------------------------------------------------------------------------- Patient/Caregiver Education Details Patient Name: Date of Service: Yolanda Manges, DO [name] 1/11/2024andnbsp12:30 PM Medical Record Number: [id number] Patient Account Number: [id number] Date of Birth/Gender: Treating RN: 11-29-1955 (67 y.o. [name] Primary Care Physician:  [name] Other Clinician: Referring Physician: Treating Physician/Extender: [name] in Treatment: 27 Education Assessment Education Provided To: Patient and Caregiver Education Topics Provided Wound/Skin Impairment: Handouts: Caring for Your Ulcer Methods: Explain/Verbal Responses: State content correctly [facility], [name] ([id number]) [phone].pdf Page 7 of 8 Electronic Signature([name]) Signed: 06/28/2022 5:09:02 PM By: [name] Entered By: [name] on 06/28/2022 13:26:59 -------------------------------------------------------------------------------- Wound Assessment Details Patient Name: Date of Service: Michael [place], DO [name] 06/28/2022 12:30 PM Medical Record Number: [id number] Patient Account Number: [id number] Date of Birth/Sex: Treating RN: 16-Mar-1956 (67 y.o. [name] Primary Care Manford Sprong: [name] Other Clinician: Referring Herny Scurlock: Treating Altair Stanko/Extender: [name] in Treatment: 27 Wound Status Wound Number: 1 Primary Pressure Ulcer Etiology: Wound Location: Midline Sacrum Wound Status: Open Wounding Event: Pressure Injury Comorbid Arrhythmia, Hypotension, History of pressure wounds, Date Acquired: 10/18/2021 History: Quadriplegia Weeks Of Treatment: 27 Clustered Wound: No Photos Wound Measurements  Length: (cm) 6 Width: (cm) 3 Depth: (cm) 2.2 Area: (cm) 14.137 Volume: (cm) 31.102 % Reduction in Area: -512.3% % Reduction in Volume: -6632% Epithelialization: Small (1-33%) Wound Description Classification: Category/Stage III Wound Margin: Epibole Exudate Amount: Medium Exudate Type: Serous Exudate Color: amber Foul Odor After Cleansing: No Slough/Fibrino Yes Wound Bed Granulation Amount: Large (67-100%) Exposed Structure Granulation Quality: Red Fascia Exposed: No Necrotic Amount: Small (1-33%) Fat Layer (Subcutaneous Tissue) Exposed: Yes Necrotic Quality:  Adherent Slough Tendon Exposed: No Muscle Exposed: No Joint Exposed: No Bone Exposed: No Tipping, Horace ([id number]) [id number] Page 8 of 8 Treatment Notes Wound #1 (Sacrum) Wound Laterality: Midline Cleanser Soap and Water Discharge Instruction: Gently cleanse wound with antibacterial soap, rinse and pat dry prior to dressing wounds Peri-Wound Care Topical Primary Dressing Hydrofera Blue Ready Transfer Foam, 4x5 (in/in) Discharge Instruction: Apply Hydrofera Blue Ready to wound bed as directed Secondary Dressing (BORDER) Zetuvit Plus SILICONE BORDER Dressing 4x4 (in/in) Discharge Instruction: Please do not put silicone bordered dressings under wraps. Use non-bordered dressing only. When applying please make sure skin is pulled flat so dressing stays in place. Secured With Compression Wrap Compression Stockings Environmental education officer) Signed: 06/28/2022 5:09:02 PM By: [name] Entered By: [name] on 06/28/2022 13:01:54 -------------------------------------------------------------------------------- Vitals Details Patient Name: Date of Service: Michael [place], DO [name] 06/28/2022 12:30 PM Medical Record Number: [id number] Patient Account Number: [id number] Date of Birth/Sex: Treating RN: 17-Mar-1956 (67 y.o. [name] Primary Care Catalaya Garr: [name] Other Clinician: Referring Yuridiana Formanek: Treating Brigham Cobbins/Extender: [name] in Treatment: 27 Vital Signs Time Taken: 13:10 Temperature (F): 98.3 Height (in): 76 Pulse (bpm): 56 Weight (lbs): 167 Respiratory Rate (breaths/min): 16 Body Mass Index (BMI): 20.3 Blood Pressure (mmHg): 93/74 Reference Range: 80 - 120 mg / dl Electronic Signature([name]) Signed: 06/28/2022 5:09:02 PM By: [name] Entered By: [name] on 06/28/2022 13:25:40

## 2022-07-10 ENCOUNTER — Ambulatory Visit: Payer: Medicare (Managed Care) | Admitting: Radiation Oncology

## 2022-07-11 ENCOUNTER — Other Ambulatory Visit: Payer: Self-pay | Admitting: *Deleted

## 2022-07-11 ENCOUNTER — Ambulatory Visit
Admission: RE | Admit: 2022-07-11 | Discharge: 2022-07-11 | Disposition: A | Discharge status: Home / Self Care | Payer: Medicare (Managed Care) | Source: Ambulatory Visit | Attending: Radiation Oncology | Admitting: Radiation Oncology

## 2022-07-11 ENCOUNTER — Encounter: Payer: Self-pay | Admitting: Radiation Oncology

## 2022-07-11 VITALS — BP 119/83 | HR 53 | Temp 98.5°F | Resp 16

## 2022-07-11 DIAGNOSIS — C61 Malignant neoplasm of prostate: Secondary | ICD-10-CM | POA: Diagnosis not present

## 2022-07-11 DIAGNOSIS — G8250 Quadriplegia, unspecified: Secondary | ICD-10-CM | POA: Diagnosis not present

## 2022-07-11 DIAGNOSIS — L8990 Pressure ulcer of unspecified site, unspecified stage: Secondary | ICD-10-CM | POA: Insufficient documentation

## 2022-07-11 DIAGNOSIS — Z923 Personal history of irradiation: Secondary | ICD-10-CM | POA: Diagnosis not present

## 2022-07-11 NOTE — Progress Notes (Signed)
Radiation Oncology Follow up Note  Name: [name]   Date:   07/11/2022 MRN:  [id number] DOB: 1956-01-19    This 67 y.o. 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  REFERRING PROVIDER: Marnee Guarneri, MD  HPI: Patient is a 67 year old male now out 1 month having completed IMRT radiation therapy to his prostate for stage IIc Gleason 7 adenocarcinoma.  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  He is seen today in follow-up 1 month out is doing well specifically denies any significant increased lower urinary tract symptoms or diarrhea.  His decubitus ulcer is stable..  COMPLICATIONS OF TREATMENT: none  FOLLOW UP COMPLIANCE: keeps appointments   PHYSICAL EXAM:  BP 119/83 (BP Location: Left Arm, Patient Position: Sitting, Cuff Size: Normal)   Pulse (!) 53   Temp 98.5 F (36.9 C) (Tympanic)   Resp 66  Wheelchair-bound male in NAD.  Abdomen is benign.  Lungs are clear.  RADIOLOGY RESULTS: No current films for review  PLAN: Present time patient is doing well very low side effect profile from radiation therapy for his prostate.  I have asked to see him back in 3 months with a repeat PSA at that time.  Patient knows to call with any concerns.  I would like to take this opportunity to thank you for allowing me to participate in the care of your patient.Noreene Filbert, MD

## 2022-07-12 ENCOUNTER — Ambulatory Visit: Payer: Medicare (Managed Care) | Admitting: Physician Assistant

## 2022-07-25 NOTE — Progress Notes (Deleted)
07/25/22 9:07 PM   [name] [birth year] [id number]  Referring provider:  [facility] [address] [place],  [place] [address]  Urological history  1. Prostate cancer -PSA pending -unfavorable intermediate prostate cnacer -transrectal digital prostate biopsies (02/27/2022 ) - 6/6 cores positive.  Gleason's 4 + 3 w/ perineural invasion present in right apex core,  Gleason's 4 + 3 in left apex core and perineural invasion w/ Gleason's 3 + 4 in the left mid core -ADT given by [facility]ACE program  2.  Neurogenic bladder -Cystoscopy (02/27/2022) - urethra normal in caliber without stricture.  Moderate lateral lobe enlargement/bladder neck elevation. Trabeculated bladder with fine crystalline particles noted.  No significant calculus noted.  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 Left renal cysts -RUS 03/2021 - Stable left renal cysts -RUS scheduled for 03/27/2022   No chief complaint on file.   HPI: [name] is a 67 y.o.male who presents today for six month follow up.       PMH: Past Medical History:  Diagnosis Date   Anemia    Aortic atherosclerosis ([place])    B12 deficiency    Bladder stones    Colostomy present (HCC)    Constipated    Elevated PSA    HLD (hyperlipidemia)    Hypotension    a.) on midodrine   Neurogenic bladder    Olecranon bursitis    PAF (paroxysmal atrial fibrillation) (HCC)    a.) CHA2DS2VASc = 2 (age, vascular disease history);  b.) rate/rhythm maintained on oral cardizem; no chronic anticoagulation   Quadriplegia, C5-C7, incomplete (HCC)    Renal cyst, left    Sacral decubitus ulcer    SIRS (systemic inflammatory response syndrome) ([place]) 06/08/2013   Spinal cord injury, C5-C7 ([place])    Vitamin D deficiency    Wheelchair dependent     Surgical History: Past Surgical  History:  Procedure Laterality Date   CYSTOSCOPY WITH LITHOLAPAXY N/A 02/27/2022   Procedure: CYSTOSCOPY;  Surgeon: Abbie Sons, MD;  Location: [facility] ORS;  Service: Urology;  Laterality: N/A;   NECK SURGERY  1976   PROSTATE BIOPSY N/A 02/27/2022   Procedure: PROSTATE BIOPSY;  Surgeon: Abbie Sons, MD;  Location: [facility] ORS;  Service: Urology;  Laterality: N/A;    Home Medications:  Allergies as of 07/26/2022   No Known Allergies      Medication List        Accurate as of July 25, 2022  9:07 PM. If you have any questions, ask your nurse or doctor.          Cholecalciferol 25 MCG (1000 UT) tablet Take by mouth.   diltiazem 60 MG tablet Commonly known as: CARDIZEM Take by mouth.   docusate sodium 100 MG capsule Commonly known as: COLACE Take 100 mg by mouth daily as needed.   gabapentin 300 MG capsule Commonly known as: NEURONTIN Take 300 mg by mouth at bedtime.   midodrine 2.5 MG tablet Commonly known as: PROAMATINE Take 2.5 mg by mouth daily.   potassium chloride 10 MEQ tablet Commonly known as: KLOR-CON Take by mouth.   tamsulosin 0.4 MG Caps capsule Commonly known as: FLOMAX Take 1 capsule (0.4 mg total) by mouth daily.   VITAMIN B 12 PO Take by mouth.        Allergies:  No Known Allergies  Family History: No family history  on file.  Social History:  reports that he has never smoked. He has never used smokeless tobacco. He reports that he does not currently use alcohol. He reports that he does not use drugs.   Physical Exam: There were no vitals taken for this visit.  Constitutional:  Well nourished. Alert and oriented, No acute distress. HEENT: [place] AT, moist mucus membranes.  Trachea midline Cardiovascular: No clubbing, cyanosis, or edema. Respiratory: Normal respiratory effort, no increased work of breathing. GU: No CVA tenderness.  No bladder fullness or masses.  Patient with circumcised/uncircumcised phallus. ***Foreskin easily  retracted***  Urethral meatus is patent.  No penile discharge. No penile lesions or rashes. Scrotum without lesions, cysts, rashes and/or edema.  Testicles are located scrotally bilaterally. No masses are appreciated in the testicles. Left and right epididymis are normal. Rectal: Patient with  normal sphincter tone. Anus and perineum without scarring or rashes. No rectal masses are appreciated. Prostate is approximately *** grams, *** nodules are appreciated. Seminal vesicles are normal. Neurologic: Grossly intact, no focal deficits, moving all 4 extremities. Psychiatric: Normal mood and affect.   Laboratory Data: Pending  Pertinent Imaging: N/A   Assessment & Plan:    1. Prostate cancer -***  2. Neurogenic bladder -***  No follow-ups on file.  Iliani Vejar, [place] [address], [place], [place] [address] [phone]

## 2022-07-26 ENCOUNTER — Encounter: Payer: Medicare (Managed Care) | Attending: Physician Assistant | Admitting: Physician Assistant

## 2022-07-26 ENCOUNTER — Ambulatory Visit: Payer: Medicare (Managed Care) | Admitting: Urology

## 2022-07-26 ENCOUNTER — Other Ambulatory Visit: Payer: Self-pay | Admitting: Family Medicine

## 2022-07-26 DIAGNOSIS — C61 Malignant neoplasm of prostate: Secondary | ICD-10-CM

## 2022-07-26 DIAGNOSIS — I480 Paroxysmal atrial fibrillation: Secondary | ICD-10-CM | POA: Diagnosis not present

## 2022-07-26 DIAGNOSIS — R935 Abnormal findings on diagnostic imaging of other abdominal regions, including retroperitoneum: Secondary | ICD-10-CM

## 2022-07-26 DIAGNOSIS — I9589 Other hypotension: Secondary | ICD-10-CM | POA: Insufficient documentation

## 2022-07-26 DIAGNOSIS — G8254 Quadriplegia, C5-C7 incomplete: Secondary | ICD-10-CM | POA: Diagnosis not present

## 2022-07-26 DIAGNOSIS — N319 Neuromuscular dysfunction of bladder, unspecified: Secondary | ICD-10-CM

## 2022-07-26 DIAGNOSIS — L89153 Pressure ulcer of sacral region, stage 3: Secondary | ICD-10-CM | POA: Insufficient documentation

## 2022-07-26 DIAGNOSIS — R339 Retention of urine, unspecified: Secondary | ICD-10-CM

## 2022-07-26 NOTE — Progress Notes (Signed)
[address], [place] ([id number]) [id number] Page 1 of 6 Visit Report for 07/26/2022 Chief Complaint Document Details Patient Name: Date of Service: [name] 07/26/2022 12:30 PM Medical Record Number: [id number] Patient Account Number: [id number] Date of Birth/Sex: Treating RN: Feb 25, 1956 (67 y.o. [name] Primary Care Provider: Leward Quan Other Clinician: Referring Provider: Treating Provider/Extender: Durwin Glaze in Treatment: 31 Information Obtained from: Patient Chief Complaint Sacral pressure ulcer Electronic Signature(s) Signed: 07/26/2022 1:06:57 PM By: Worthy Keeler PA-C Entered By: Worthy Keeler on 07/26/2022 13:06:57 -------------------------------------------------------------------------------- HPI Details Patient Name: Date of Service: Michael [place], DO UGLA S 07/26/2022 12:30 PM Medical Record Number: [id number] Patient Account Number: [id number] Date of Birth/Sex: Treating RN: 05/13/1956 (67 y.o. [name] Primary Care Provider: Leward Quan Other Clinician: Referring Provider: Treating Provider/Extender: Durwin Glaze in Treatment: 31 History of Present Illness HPI Description: 12-18-2021 upon evaluation today patient presents for initial evaluation here in the clinic concerning an issue he has been having over the sacral area where he has a wound this is a stage III pressure ulcer. It also appears there may still be some moisture component to this based on what I am seeing. I do not see any evidence of infection but at the same time I do believe that the patient is in need of some additional and hopefully better dressings in order to help this heal more appropriately and quickly. Currently just a protective dressing has been used over this sounds like may possibly be a border foam. The patient did have a CT scan which was negative for osteomyelitis he had a previous wound above this in the sacral region but  not necessarily right of this area. 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 He also has atrial fibrillation and hypotension. 01-01-2022 upon evaluation today patient appears to be doing well with regard to his wound actually see some signs of improvement there is still quite a bit of feeling to be done here but nonetheless I think we are on the right track. I do not see any evidence of active infection locally or systemically at this time which is great news. 01-25-2022 upon evaluation today patient's wound really is not significantly better it actually appears about the same. Of note I do believe that part of the issue here is probably the fact that honestly it is hard to keep the dressing in place I think that when the alginate is put on it needs to be cut bigger than what is currently being seen on the dressing today probably by about 3 times that size and placed directly to the wound bed while spreading the butt cheeks bilaterally. This will allow for the dressing to be right intact with the wound bed so that when it does try to closing on itself it will actually fold on the alginate and not [facility], [name] ([id number]) [phone].pdf Page 2 of 6 actually contact skin to skin. 02-09-2022 upon evaluation today patient appears to be doing about the same in regard to his wound. Again I do not think the dressings being quite put on correctly keeping the alginate in contact with the wound bed this just does not seem to be quite doing the job. I think we may need to switch to Dakin's moistened gauze dressing this is a very precarious location I am not even sure that he is not getting some feces into this dressing and underneath. I think it  is probably going to be changed daily and switch again to the Dakin's. 02-23-2022 upon evaluation today patient unfortunately still continues to have issues with his sacral area. Again this is a very precarious  spot but honestly I think is made worse by the fact that he is up sitting the majority of the day. He typically gets up by 10 he tells me and does not go to bed until around 11 or 12 this is a good solid at least 12 to 14 hours that he is up day in and day out. With that being said I think that this is probably one of the main reasons why this wound is not healing the other being obviously moisture control I do think the surface of the wound looks better this week compared to previous with the Dakin's but at the same time the wound itself does not appear to be any smaller. 03-12-2022 upon evaluation today patient appears to be doing poorly currently in regard to the wound and sacral region. Unfortunately this is not showing signs of improvement and he actually has a small area of deep tissue injury right in the middle right at the point where the bone of the coccyx is pushing onto the wound bed. Again I think that this is likely end up opening up into a much deeper wound that extends down to bone if he is unable to get off of this. So far he has not been able to really do this effectively tells me that he has been "trying" but only succeeding minimally. 03-27-2022 upon evaluation today patient appears to be doing a little better in regard to his wound compared to last time I saw him. At that point he started to have some breakdown of the central portion of the wound the good news is that has improved. 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 04-09-2022 upon evaluation today patient's wound is actually showing signs of doing okay. Its not significantly smaller and is also not any larger nor showing signs of breaking down. He tells me he is still trying to stay off of this on the weekends. He also tells me during the week he is [facility] more frequently 3 times a week at this  point. With that being said overall I am pleased in that regard and I am happy that he is doing somewhat better. With that being said I explained to the patient as well that I do believe he still needs to be offloading is much as he can. 05-03-2022 upon evaluation today patient appears to be doing well currently in regard to his wound from the standpoint of it not being infected I do not see anything obvious at this point which is great news. Fortunately I do not see any signs of active infection locally or systemically at this well which is also great news. Overall I do believe that the biggest issue simply is just that he is going to have a lot of trouble getting this to heal simply due to the fact that he has a lot of issues getting off of this routinely and even so it is a very difficult location even in the best of circumstances. Again its not deep enough for wound VAC right now. Hopefully will not get to that point. 05-24-2022 upon evaluation today patient actually does have some signs  of new skin growth around the edges of the wound. This was very subtle but nonetheless does seem to be doing better compared to what I previously seen. I think that the wound looks clean and does not appear to be trying to get any deeper which is good news. He does tell me that he is nearing the completion of his radiation therapy. That is for the prostate. 06-07-2022 upon evaluation today patient appears to be doing well currently in regard to his wound there does appear to be more granulation tissue which is healthy showing up at this point. Fortunately I do not see any signs of active infection at this time. No fevers, chills, nausea, vomiting, or diarrhea. 06-28-2022 upon evaluation today patient appears to be doing really about the same if not even a little bit worse in regard to his wound. Unfortunately I do not see where this is infected but also do not see where this is showing signs of improvement. I do  believe that we need to do something to try to get things moving in a better direction. Again I am good to make a switch in the dressing today just to see how this will do I think may be going to a silver cell dressing would be a better option currently. With that being said I am also going to recommend based on what we are seeing that we may want to get him into a plastic surgeon in order to see if there is anything that could be done surgically to close this wound. Obviously this is a very difficult spot in a very difficult location but nonetheless I think the sooner we get it healed the less of a chance of infection or anything breaking down and getting worse he would have. The patient voiced understanding I am getting in touch with Marchia Bond, MD as well who is [facility] provider at the [facility] in order to see her opinion and what we can do to expedite this referral. 07-26-2022 upon evaluation today patient appears to be doing well currently in regard to his wound all things considered this is likely stable. Fortunately I do not see any signs of active infection locally nor systemically at this time which is great news. I did get a call from [facility] today and they have made a referral to plastic surgery at [facility] the patient will be going to [facility] am not sure the exact date. Electronic Signature(s) Signed: 07/26/2022 1:43:34 PM By: Worthy Keeler PA-C Entered By: Worthy Keeler on 07/26/2022 13:43:33 -------------------------------------------------------------------------------- Physical Exam Details Patient Name: Date of Service: Michael [place], DO [name] 07/26/2022 12:30 PM Medical Record Number: [id number] Patient Account Number: [id number] Date of Birth/Sex: Treating RN: 08/02/1955 (67 y.o. [name] Primary Care Provider: Leward Quan Other Clinician: Referring Provider: Treating Provider/Extender: Durwin Glaze in Treatment: 84 Constitutional Well-nourished and well-hydrated in no  acute distress. Respiratory [place], [place] ([id number]) [id number] Page 3 of 6 normal breathing without difficulty. Psychiatric this patient is able to make decisions and demonstrates good insight into disease process. Alert and Oriented x 3. pleasant and cooperative. Notes Upon inspection patient's wound bed actually showed signs of good granulation epithelization at this point. Fortunately I do not see any signs of active infection locally nor systemically which is great news. He does have a lot of drainage unfortunately. With that being said I do think that he is going to need to continue with the appropriate offloading and for now  would not stick with the alginate dressings as well although honestly has not seen improvement it seems to keep things at a standstill overall. Electronic Signature(s) Signed: 07/26/2022 1:44:22 PM By: Worthy Keeler PA-C Entered By: Worthy Keeler on 07/26/2022 13:44:22 -------------------------------------------------------------------------------- Problem List Details Patient Name: Date of Service: Michael [place], DO UGLA S 07/26/2022 12:30 PM Medical Record Number: [id number] Patient Account Number: [id number] Date of Birth/Sex: Treating RN: 31-Jan-1956 (67 y.o. [name] Primary Care Provider: Leward Quan Other Clinician: Referring Provider: Treating Provider/Extender: Durwin Glaze in Treatment: 31 Active Problems ICD-10 Encounter Code Description Active Date MDM Diagnosis L89.153 Pressure ulcer of sacral region, stage 3 12/18/2021 No Yes G82.54 Quadriplegia, C5-C7 incomplete 12/18/2021 No Yes I48.0 Paroxysmal atrial fibrillation 12/18/2021 No Yes I95.89 Other hypotension 12/18/2021 No Yes Inactive Problems Resolved Problems Electronic Signature(s) Signed: 07/26/2022 1:06:48 PM By: Worthy Keeler PA-C Entered By: Worthy Keeler on 07/26/2022 13:06:48 [place], [place] ([id number])  [id number] Page 4 of 6 -------------------------------------------------------------------------------- Progress Note Details Patient Name: Date of Service: [name] 07/26/2022 12:30 PM Medical Record Number: [id number] Patient Account Number: [id number] Date of Birth/Sex: Treating RN: Nov 01, 1955 (66 y.o. [name] Primary Care Provider: Leward Quan Other Clinician: Referring Provider: Treating Provider/Extender: Durwin Glaze in Treatment: 31 Subjective Chief Complaint Information obtained from Patient Sacral pressure ulcer History of Present Illness (HPI) 12-18-2021 upon evaluation today patient presents for initial evaluation here in the clinic concerning an issue he has been having over the sacral area where he has a wound this is a stage III pressure ulcer. It also appears there may still be some moisture component to this based on what I am seeing. I do not see any evidence of infection but at the same time I do believe that the patient is in need of some additional and hopefully better dressings in order to help this heal more appropriately and quickly. Currently just a protective dressing has been used over this sounds like may possibly be a border foam. The patient did have a CT scan which was negative for osteomyelitis he had a previous wound above this in the sacral region but not necessarily right of this area. 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 He also has atrial fibrillation and hypotension. 01-01-2022 upon evaluation today patient appears to be doing well with regard to his wound actually see some signs of improvement there is still quite a bit of feeling to be done here but nonetheless I think we are on the right track. I do not see any evidence of active infection locally or systemically at this time which is great news. 01-25-2022 upon evaluation today patient's wound really is  not significantly better it actually appears about the same. Of note I do believe that part of the issue here is probably the fact that honestly it is hard to keep the dressing in place I think that when the alginate is put on it needs to be cut bigger than what is currently being seen on the dressing today probably by about 3 times that size and placed directly to the wound bed while spreading the butt cheeks bilaterally. This will allow for the dressing to be right intact with the wound bed so that when it does try to closing on itself it will actually fold on the alginate and not actually contact skin to skin. 02-09-2022 upon evaluation today patient appears to be doing  about the same in regard to his wound. Again I do not think the dressings being quite put on correctly keeping the alginate in contact with the wound bed this just does not seem to be quite doing the job. I think we may need to switch to Dakin's moistened gauze dressing this is a very precarious location I am not even sure that he is not getting some feces into this dressing and underneath. I think it is probably going to be changed daily and switch again to the Dakin's. 02-23-2022 upon evaluation today patient unfortunately still continues to have issues with his sacral area. Again this is a very precarious spot but honestly I think is made worse by the fact that he is up sitting the majority of the day. He typically gets up by 10 he tells me and does not go to bed until around 11 or 12 this is a good solid at least 12 to 14 hours that he is up day in and day out. With that being said I think that this is probably one of the main reasons why this wound is not healing the other being obviously moisture control I do think the surface of the wound looks better this week compared to previous with the Dakin's but at the same time the wound itself does not appear to be any smaller. 03-12-2022 upon evaluation today patient appears to be doing  poorly currently in regard to the wound and sacral region. Unfortunately this is not showing signs of improvement and he actually has a small area of deep tissue injury right in the middle right at the point where the bone of the coccyx is pushing onto the wound bed. Again I think that this is likely end up opening up into a much deeper wound that extends down to bone if he is unable to get off of this. So far he has not been able to really do this effectively tells me that he has been "trying" but only succeeding minimally. 03-27-2022 upon evaluation today patient appears to be doing a little better in regard to his wound compared to last time I saw him. At that point he started to have some breakdown of the central portion of the wound the good news is that has improved. 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 04-09-2022 upon evaluation today patient's wound is actually showing signs of doing okay. Its not significantly smaller and is also not any larger nor showing signs of breaking down. He tells me he is still trying to stay off of this on the weekends. He also tells me during the week he is [facility] more frequently 3 times a week at this point. With that being said overall I am pleased in that regard and I am happy that he is doing somewhat better. With that being said I explained to the patient as well that I do believe he still needs to be offloading is much as he can. 05-03-2022 upon evaluation today patient appears to be doing well currently in regard to his wound from the standpoint of it not being infected I do not see anything obvious at this point which is great news. Fortunately I do not see any signs of active infection locally or systemically at this well which is also great news. Overall I do believe that the biggest  issue simply is just that he is going  to have a lot of trouble getting this to heal simply due to the fact that he has a lot of issues getting off of this routinely and even so it is a very difficult location even in the best of circumstances. Again its not deep enough for wound VAC right now. Hopefully will not get to that point. 05-24-2022 upon evaluation today patient actually does have some signs of new skin growth around the edges of the wound. This was very subtle but nonetheless does seem to be doing better compared to what I previously seen. I think that the wound looks clean and does not appear to be trying to get any deeper which is good news. He does tell me that he is nearing the completion of his radiation therapy. That is for the prostate. 06-07-2022 upon evaluation today patient appears to be doing well currently in regard to his wound there does appear to be more granulation tissue which is healthy showing up at this point. Fortunately I do not see any signs of active infection at this time. No fevers, chills, nausea, vomiting, or diarrhea. 06-28-2022 upon evaluation today patient appears to be doing really about the same if not even a little bit worse in regard to his wound. Unfortunately I do not see where this is infected but also do not see where this is showing signs of improvement. I do believe that we need to do something to try to get things [facility], [name] ([id number]) [id number] Page 5 of 6 moving in a better direction. Again I am good to make a switch in the dressing today just to see how this will do I think may be going to a silver cell dressing would be a better option currently. With that being said I am also going to recommend based on what we are seeing that we may want to get him into a plastic surgeon in order to see if there is anything that could be done surgically to close this wound. Obviously this is a very difficult spot in a very difficult location but nonetheless I think  the sooner we get it healed the less of a chance of infection or anything breaking down and getting worse he would have. The patient voiced understanding I am getting in touch with Marchia Bond, MD as well who is his provider at the [facility] in order to see her opinion and what we can do to expedite this referral. 07-26-2022 upon evaluation today patient appears to be doing well currently in regard to his wound all things considered this is likely stable. Fortunately I do not see any signs of active infection locally nor systemically at this time which is great news. I did get a call from [facility] today and they have made a referral to plastic surgery at [facility] the patient will be going to [facility] am not sure the exact date. Objective Constitutional Well-nourished and well-hydrated in no acute distress. Vitals Time Taken: 1:00 PM, Height: 76 in, Weight: 167 lbs, BMI: 20.3, Temperature: 99.0 F, Pulse: 81 bpm, Respiratory Rate: 16 breaths/min, Blood Pressure: 86/61 mmHg. Respiratory normal breathing without difficulty. Psychiatric this patient is able to make decisions and demonstrates good insight into disease process. Alert and Oriented x 3. pleasant and cooperative. General Notes: Upon inspection patient's wound bed actually showed signs of good granulation epithelization at this point. Fortunately I do not see any signs of active infection locally nor systemically which is  great news. He does have a lot of drainage unfortunately. With that being said I do think that he is going to need to continue with the appropriate offloading and for now would not stick with the alginate dressings as well although honestly has not seen improvement it seems to keep things at a standstill overall. Integumentary (Hair, Skin) Wound #1 status is Open. Original cause of wound was Pressure Injury. The date acquired was: 10/18/2021. The wound has been in treatment 31 weeks. The wound is located on the Midline Sacrum. The  wound measures 6cm length x 3cm width x 2cm depth; 14.137cm^2 area and 28.274cm^3 volume. There is Fat Layer (Subcutaneous Tissue) exposed. There is a medium amount of serous drainage noted. The wound margin is epibole. There is large (67-100%) red granulation within the wound bed. There is a small (1-33%) amount of necrotic tissue within the wound bed including Adherent Slough. Assessment Active Problems ICD-10 Pressure ulcer of sacral region, stage 3 Quadriplegia, C5-C7 incomplete Paroxysmal atrial fibrillation Other hypotension Plan 1. I do believe that the patient would benefit from evaluation with the plastic surgeon. I did suggest currently that we go ahead and see what they have to say he will be seeing them in March. I think this is probably the patient's best option to get this wound closed as to be honest we are really not making much progress and this is a very precarious location as far as the wound is concerned from our standpoint. 2. I am also can recommend he should continue with appropriate offloading this is still to be of utmost importance I discussed that with the patient today as well. 3. I am also can recommend the patient should continue with the silver alginate dressing for the time being. We will see patient back for reevaluation in 1 week here in the clinic. If anything worsens or changes patient will contact our office for additional recommendations. Electronic Signature(s) Signed: 07/26/2022 1:45:15 PM By: Worthy Keeler PA-C Entered By: Worthy Keeler on 07/26/2022 13:45:14 [place], [place] ([id number]) [id number] Page 6 of 6 -------------------------------------------------------------------------------- SuperBill Details Patient Name: Date of Service: [name] 07/26/2022 Medical Record Number: [id number] Patient Account Number: [id number] Date of Birth/Sex: Treating RN: 1956-04-30 (67 y.o. [name] Primary Care  Provider: Leward Quan Other Clinician: Referring Provider: Treating Provider/Extender: Durwin Glaze in Treatment: 31 Diagnosis Coding ICD-10 Codes Code Description [id number] Pressure ulcer of sacral region, stage 3 G82.54 Quadriplegia, C5-C7 incomplete I48.0 Paroxysmal atrial fibrillation I95.89 Other hypotension Physician Procedures : CPT4 Code Description Modifier [id number] - WC PHYS LEVEL 3 - EST PT ICD-10 Diagnosis Description L89.153 Pressure ulcer of sacral region, stage 3 G82.54 Quadriplegia, C5-C7 incomplete I48.0 Paroxysmal atrial fibrillation I95.89 Other hypotension Quantity: 1 Electronic Signature(s) Signed: 07/26/2022 1:45:42 PM By: Worthy Keeler PA-C Entered By: Worthy Keeler on 07/26/2022 13:45:42

## 2022-07-26 NOTE — Progress Notes (Addendum)
[place] ([id number]) [id number] Page 1 of 8 Visit Report for 07/26/2022 Arrival Information Details Patient Name: Date of Service: Michael [name] 07/26/2022 12:[name] [name] Medical Record Number: [id number] Patient Account Number: [id number] Date of Birth/Sex: Treating RN: [birth year] (67 y.o. Male) Midge Aver Primary Care Barry Culverhouse: [place] Other Clinician: Referring Chukwuebuka Churchill: Treating Rockwell Zentz/Extender: Glenis Smoker in Treatment: 31 Visit Information History Since Last Visit Added or deleted any medications: No Patient Arrived: Wheel Chair Any new allergies or adverse reactions: No Arrival Time: 12:48 Had a fall or experienced change in No Accompanied By: caretaker activities of daily living that may affect Transfer Assistance: Michiel Sites Lift risk of falls: Patient Identification Verified: Yes Hospitalized since last visit: No Secondary Verification Process Completed: Yes Has Dressing in Place as Prescribed: Yes Patient Requires Transmission-Based Precautions: No Pain Present Now: No Patient Has Alerts: Yes Patient Alerts: NOT DIABETIC Electronic Signature([name]) Signed: 09/13/2022 4:[name]:44 [name] By: Midge Aver MSN RN CNS WTA Previous Signature: 07/26/2022 4:05:54 [name] Version By: Midge Aver MSN RN CNS WTA Entered By: Midge Aver on 09/13/2022 13:[name]:44 -------------------------------------------------------------------------------- Clinic Level of Care Assessment Details Patient Name: Date of Service: Michael [name], Michael [name] 07/26/2022 12:[name] [name] Medical Record Number: [id number] Patient Account Number: [id number] Date of Birth/Sex: Treating RN: Michael [name], Michael [name] (67 y.o. Male) Midge Aver Primary Care Antonia Culbertson: [place] Other Clinician: Referring Torianne Laflam: Treating Danzig Macgregor/Extender: Glenis Smoker in Treatment: 31 Clinic Level of Care Assessment Items TOOL 4 Quantity Score X- 1 0 Use when only an EandM is performed on FOLLOW-UP  visit ASSESSMENTS - Nursing Assessment / Reassessment X- 1 10 Reassessment of Co-morbidities (includes updates in patient status) X- 1 5 Reassessment of Adherence to Treatment Plan ASSESSMENTS - Wound and Skin A ssessment / Reassessment X - Simple Wound Assessment / Reassessment - one wound 1 5 [name], Michael ([id number]) [phone].pdf Page 2 of 8 []  - 0 Complex Wound Assessment / Reassessment - multiple wounds []  - 0 Dermatologic / Skin Assessment (not related to wound area) ASSESSMENTS - Focused Assessment []  - 0 Circumferential Edema Measurements - multi extremities []  - 0 Nutritional Assessment / Counseling / Intervention []  - 0 Lower Extremity Assessment (monofilament, tuning fork, pulses) []  - 0 Peripheral Arterial Disease Assessment (using hand held doppler) ASSESSMENTS - Ostomy and/or Continence Assessment and Care []  - 0 Incontinence Assessment and Management []  - 0 Ostomy Care Assessment and Management (repouching, etc.) PROCESS - Coordination of Care X - Simple Patient / Family Education for ongoing care 1 15 []  - 0 Complex (extensive) Patient / Family Education for ongoing care X- 1 10 Staff obtains Chiropractor, Records, T Results / Process Orders est []  - 0 Staff telephones HHA, Nursing Homes / Clarify orders / etc []  - 0 Routine Transfer to another Facility (non-emergent condition) []  - 0 Routine Hospital Admission (non-emergent condition) []  - 0 New Admissions / Manufacturing engineer / Ordering NPWT Apligraf, etc. , []  - 0 Emergency Hospital Admission (emergent condition) X- 1 10 Simple Discharge Coordination []  - 0 Complex (extensive) Discharge Coordination PROCESS - Special Needs []  - 0 Pediatric / Minor Patient Management []  - 0 Isolation Patient Management []  - 0 Hearing / Language / Visual special needs []  - 0 Assessment of Community assistance (transportation, D/C planning, etc.) []  - 0 Additional assistance /  Altered mentation []  - 0 Support Surface([name]) Assessment (bed, cushion, seat, etc.) INTERVENTIONS - Wound Cleansing / Measurement X - Simple Wound Cleansing - one wound 1 5 []  -  0 Complex Wound Cleansing - multiple wounds X- 1 5 Wound Imaging (photographs - any number of wounds) []  - 0 Wound Tracing (instead of photographs) X- 1 5 Simple Wound Measurement - one wound []  - 0 Complex Wound Measurement - multiple wounds INTERVENTIONS - Wound Dressings X - Small Wound Dressing one or multiple wounds 1 10 []  - 0 Medium Wound Dressing one or multiple wounds []  - 0 Large Wound Dressing one or multiple wounds []  - 0 Application of Medications - topical []  - 0 Application of Medications - injection INTERVENTIONS - Miscellaneous []  - 0 External ear exam []  - 0 Specimen Collection (cultures, biopsies, blood, body fluids, etc.) []  - 0 Specimen([name]) / Culture([name]) sent or taken to Lab for analysis [facility], Michael [name] ([id number]) [phone].pdf Page 3 of 8 []  - 0 Patient Transfer (multiple staff / Nurse, adult / Similar devices) []  - 0 Simple Staple / Suture removal (25 or less) []  - 0 Complex Staple / Suture removal (26 or more) []  - 0 Hypo / Hyperglycemic Management (close monitor of Blood Glucose) []  - 0 Ankle / Brachial Index (ABI) - Michael [name] X- 1 5 Vital Signs Has the patient been seen at the hospital within the last three years: Yes Total Score: 85 Level Of Care: New/Established - Level 3 Electronic Signature([name]) Signed: 09/13/2022 4:36:41 [name] By: Midge Aver MSN RN CNS WTA Entered By: Midge Aver on 09/13/2022 13:24:25 -------------------------------------------------------------------------------- Encounter Discharge Information Details Patient Name: Date of Service: Michael [name], Michael [name] 07/26/2022 12:[name] [name] Medical Record Number: [id number] Patient Account Number: [id number] Date of Birth/Sex: Treating RN: Michael [name][birth year] (67 y.o. Male) Midge Aver Primary Care Jesslynn Kruck: [place] Other Clinician: Referring Tiane Szydlowski: Treating Dareion Kneece/Extender: Glenis Smoker in Treatment: 31 Encounter Discharge Information Items Discharge Condition: Stable Ambulatory Status: Wheelchair Discharge Destination: Home Transportation: Private Auto Accompanied By: self Schedule Follow-up Appointment: Yes Clinical Summary of Care: Electronic Signature([name]) Signed: 09/13/2022 4:26:41 [name] By: Midge Aver MSN RN CNS WTA Entered By: Midge Aver on 09/13/2022 13:26:41 -------------------------------------------------------------------------------- Lower Extremity Assessment Details Patient Name: Date of Service: Michael [name], Michael [name] 07/26/2022 12:[name] [name] Medical Record Number: [id number] Patient Account Number: [id number] Date of Birth/Sex: Treating RN: [birth year] (66 y.o. Male) Midge Aver Primary Care Breylan Lefevers: [place] Other Clinician: Unk Lightning ([id number]) [id number] Page 4 of 8 Referring Annaliese Saez: Treating Jie Stickels/Extender: Glenis Smoker in Treatment: 31 Electronic Signature([name]) Signed: 09/13/2022 4:23:03 [name] By: Midge Aver MSN RN CNS WTA Previous Signature: 07/26/2022 4:05:54 [name] Version By: Midge Aver MSN RN CNS WTA Entered By: Midge Aver on 09/13/2022 13:23:03 -------------------------------------------------------------------------------- Multi Wound Chart Details Patient Name: Date of Service: Michael [name], Michael [name] 07/26/2022 12:[name] [name] Medical Record Number: [id number] Patient Account Number: [id number] Date of Birth/Sex: Treating RN: [birth year] Michael [name] (66 y.o. Male) Midge Aver Primary Care Buel Molder: [place] Other Clinician: Referring Keyan Folson: Treating Alacia Rehmann/Extender: Glenis Smoker in Treatment: 31 Vital Signs Height(in): 76 Pulse(bpm): 81 Weight(lbs): 167 Blood Pressure(mmHg): 86/61 Body Mass Index(BMI): 20.3 Temperature(F): 99.0 Respiratory  Rate(breaths/min): 16 [1:Photos:] [N/A:N/A] Midline Sacrum N/A N/A Wound Location: Pressure Injury N/A N/A Wounding Event: Pressure Ulcer N/A N/A Primary Etiology: Arrhythmia, Hypotension, History of N/A N/A Comorbid History: pressure wounds, Quadriplegia 10/18/2021 N/A N/A Date Acquired: 31 N/A N/A Weeks of Treatment: Open N/A N/A Wound Status: No N/A N/A Wound Recurrence: 6x3x2 N/A N/A Measurements L x W x D (cm) 14.137 N/A N/A A (cm) : rea 28.274 N/A N/A Volume (cm) : -  512.[name]% N/A N/A % Reduction in A rea: -6019.90% N/A N/A % Reduction in Volume: Category/Stage III N/A N/A Classification: Medium N/A N/A Exudate A mount: Serous N/A N/A Exudate Type: amber N/A N/A Exudate Color: Epibole N/A N/A Wound Margin: Large (67-100%) N/A N/A Granulation A mount: Red N/A N/A Granulation Quality: Small (1-33%) N/A N/A Necrotic A mount: Fat Layer (Subcutaneous Tissue): Yes N/A N/A Exposed Structures: Fascia: No Tendon: No Muscle: No Joint: No Bone: No Small (1-33%) N/A N/A Epithelialization: Michael [name], Michael [name] ([id number]) [id number] Page 5 of 8 Treatment Notes Electronic Signature([name]) Signed: 09/13/2022 4:23:13 [name] By: Midge Aver MSN RN CNS WTA Previous Signature: 07/26/2022 4:05:54 [name] Version By: Midge Aver MSN RN CNS WTA Entered By: Midge Aver on 09/13/2022 13:23:13 -------------------------------------------------------------------------------- Multi-Disciplinary Care Plan Details Patient Name: Date of Service: Michael [name], Michael [name] 07/26/2022 12:[name] [name] Medical Record Number: [id number] Patient Account Number: [id number] Date of Birth/Sex: Treating RN: [birth year] (66 y.o. Male) Midge Aver Primary Care Lady Wisham: [place] Other Clinician: Referring Dalana Pfahler: Treating Dinora Hemm/Extender: Glenis Smoker in Treatment: 31 Active Inactive Electronic Signature([name]) Signed: 10/02/2022 1:01:41 [name] By: Elliot Gurney, BSN, RN, CWS, Kim RN,  BSN Signed: 8/[name]/2024 2:48:52 [name] By: Midge Aver MSN RN CNS WTA Previous Signature: 09/13/2022 4:25:54 [name] Version By: Midge Aver MSN RN CNS WTA Previous Signature: 07/26/2022 4:05:54 [name] Version By: Midge Aver MSN RN CNS WTA Entered By: Elliot Gurney, BSN, RN, CWS, Kim on 10/02/2022 10:01:41 -------------------------------------------------------------------------------- Pain Assessment Details Patient Name: Date of Service: Michael [name], Michael [name] 07/26/2022 12:[name] [name] Medical Record Number: [id number] Patient Account Number: [id number] Date of Birth/Sex: Treating RN: Michael [name][birth year] (66 y.o. Male) Midge Aver Primary Care Teruko Joswick: [place] Other Clinician: Referring Keishla Oyer: Treating Amarisa Wilinski/Extender: Glenis Smoker in Treatment: 31 Active Problems Location of Pain Severity and Description of Pain Patient Has Paino No Site Locations Michael [name], Michael [name] ([id number]) [id number] Page 6 of 8 Pain Management and Medication Current Pain Management: Electronic Signature([name]) Signed: 09/13/2022 4:[name]:54 [name] By: Midge Aver MSN RN CNS WTA Previous Signature: 07/26/2022 4:05:54 [name] Version By: Midge Aver MSN RN CNS WTA Entered By: Midge Aver on 09/13/2022 13:[name]:54 -------------------------------------------------------------------------------- Patient/Caregiver Education Details Patient Name: Date of Service: Michael [name], Michael [name] 2/8/2024andnbsp12:[name] [name] Medical Record Number: [id number] Patient Account Number: [id number] Date of Birth/Gender: Treating RN: [birth year] [name] (67 y.o. Male) Midge Aver Primary Care Physician: [place] Other Clinician: Referring Physician: Treating Physician/Extender: Glenis Smoker in Treatment: 31 Education Assessment Education Provided To: Patient Education Topics Provided Pressure: Handouts: Pressure Injury: Prevention and Offloading Methods: Explain/Verbal Responses: State content correctly Wound/Skin  Impairment: Handouts: Caring for Your Ulcer Methods: Explain/Verbal Responses: State content correctly Electronic Signature([name]) Signed: 09/13/2022 4:36:41 [name] By: Midge Aver MSN RN CNS WTA Previous Signature: 07/26/2022 4:05:54 [name] Version By: Midge Aver MSN RN CNS WTA Entered By: Midge Aver on 09/13/2022 13:25:59 Michael [name], Michael [name] ([id number]) [id number] Page 7 of 8 -------------------------------------------------------------------------------- Wound Assessment Details Patient Name: Date of Service: Michael [name] 07/26/2022 12:[name] [name] Medical Record Number: [id number] Patient Account Number: [id number] Date of Birth/Sex: Treating RN: Michael [name], Michael [name] (66 y.o. Male) Midge Aver Primary Care Maliha Outten: [place] Other Clinician: Referring Jovanie Verge: Treating Nikkita Adeyemi/Extender: Glenis Smoker in Treatment: 31 Wound Status Wound Number: 1 Primary Pressure Ulcer Etiology: Wound Location: Midline Sacrum Wound Status: Open Wounding Event: Pressure Injury Comorbid Arrhythmia, Hypotension, History of pressure wounds, Date Acquired: 10/18/2021 History: Quadriplegia Weeks Of Treatment: 31 Clustered Wound: No Photos Wound Measurements Length: (cm) 6 Width: (cm) 3  Depth: (cm) 2 Area: (cm) 14.137 Volume: (cm) 28.274 % Reduction in Area: -512.3% % Reduction in Volume: -6019.9% Epithelialization: Small (1-33%) Wound Description Classification: Category/Stage III Wound Margin: Epibole Exudate Amount: Medium Exudate Type: Serous Exudate Color: amber Foul Odor After Cleansing: No Slough/Fibrino Yes Wound Bed Granulation Amount: Large (67-100%) Exposed Structure Granulation Quality: Red Fascia Exposed: No Necrotic Amount: Small (1-33%) Fat Layer (Subcutaneous Tissue) Exposed: Yes Necrotic Quality: Adherent Slough Tendon Exposed: No Muscle Exposed: No Joint Exposed: No Bone Exposed: No Electronic Signature([name]) Signed: 07/26/2022 4:05:54 [name] By: Midge Aver MSN RN CNS WTA Entered By: Midge Aver on 07/26/2022 10:06:25 Swier, Michael [name] ([id number]) [id number] Page 8 of 8 -------------------------------------------------------------------------------- Vitals Details Patient Name: Date of Service: Michael [name] 07/26/2022 12:[name] [name] Medical Record Number: [id number] Patient Account Number: [id number] Date of Birth/Sex: Treating RN: [birth year] Michael [name] (66 y.o. Male) Midge Aver Primary Care Alizia Greif: [place] Other Clinician: Referring Mahkai Fangman: Treating Kura Bethards/Extender: Glenis Smoker in Treatment: 31 Vital Signs Time Taken: 13:00 Temperature (F): 99.0 Height (in): 76 Pulse (bpm): 81 Weight (lbs): 167 Respiratory Rate (breaths/min): 16 Body Mass Index (BMI): 20.3 Blood Pressure (mmHg): 86/61 Reference Range: 80 - 120 mg / dl Electronic Signature([name]) Signed: 09/13/2022 4:[name]:49 [name] By: Midge Aver MSN RN CNS WTA Previous Signature: 07/26/2022 4:05:54 [name] Version By: Midge Aver MSN RN CNS WTA Entered By: Midge Aver on 09/13/2022 13:[name]:48

## 2022-08-06 ENCOUNTER — Ambulatory Visit
Admission: RE | Admit: 2022-08-06 | Discharge: 2022-08-06 | Disposition: A | Discharge status: Home / Self Care | Payer: Medicare (Managed Care) | Source: Ambulatory Visit | Attending: Family Medicine | Admitting: Family Medicine

## 2022-08-06 DIAGNOSIS — R935 Abnormal findings on diagnostic imaging of other abdominal regions, including retroperitoneum: Secondary | ICD-10-CM | POA: Insufficient documentation

## 2022-08-06 DIAGNOSIS — N319 Neuromuscular dysfunction of bladder, unspecified: Secondary | ICD-10-CM | POA: Insufficient documentation

## 2022-08-06 DIAGNOSIS — R339 Retention of urine, unspecified: Secondary | ICD-10-CM

## 2022-08-07 NOTE — Progress Notes (Signed)
08/12/22 5:42 PM   [name] Sep 05, 1955 [id number]  Referring provider:  [facility], [name] [address] [place],  [place] [address]  Urological history  1. Prostate cancer -PSA pending -unfavorable intermediate prostate cnacer -transrectal digital prostate biopsies (02/27/2022 ) - 6/6 cores positive.  Gleason's 4 + 3 w/ perineural invasion present in right apex core,  Gleason's 4 + 3 in left apex core and perineural invasion w/ Gleason's 3 + 4 in the left mid core -completed IMRT 05/2022 -[facility] by [facility]  2.  Neurogenic bladder -Cystoscopy (02/27/2022) - urethra normal in caliber without stricture.  Moderate lateral lobe enlargement/bladder neck elevation. Trabeculated bladder with fine crystalline particles noted.  No significant calculus noted.  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 Left renal cysts -RUS 03/2021 - Stable left renal cysts -RUS scheduled for 03/27/2022   Chief Complaint  Patient presents with   Prostate Cancer    HPI: [name] is a 67 y.o.male who presents today for six month follow up with his sister,  [name].   PSA is drawn today.  He states he has received one injection of ADT while at [facility].  He continues to have issues with frequency, leakage of urine and a weak urinary stream.  He states they keep treating me for a "urinary tract infection," but they can't get rid of it.  He has been on several antibiotics and does not see improvement of his symptoms.  He has not been catheterized for any of the specimens.    UA yellow cloudy, specific gravity 1.020, 2+ blood, pH 6.5, trace protein, 3+ leukocyte, greater than 30 WBCs, 3-10 RBCs, 0-10 epithelial cells, crystals present and many bacteria noted.  He would not allow [place] to catheterize him.  PVR 420 mL  PMH: Past Medical  History:  Diagnosis Date   Anemia    Aortic atherosclerosis (HCC)    B12 deficiency    Bladder stones    Colostomy present (HCC)    Constipated    Elevated PSA    HLD (hyperlipidemia)    Hypotension    a.) on midodrine   Neurogenic bladder    Olecranon bursitis    PAF (paroxysmal atrial fibrillation) (HCC)    a.) CHA2DS2VASc = 2 (age, vascular disease history);  b.) rate/rhythm maintained on oral cardizem; no chronic anticoagulation   Quadriplegia, C5-C7, incomplete (HCC)    Renal cyst, left    Sacral decubitus ulcer    SIRS (systemic inflammatory response syndrome) ([place]) 06/08/2013   Spinal cord injury, C5-C7 ([place])    Vitamin D deficiency    Wheelchair dependent     Surgical History: Past Surgical History:  Procedure Laterality Date   CYSTOSCOPY WITH LITHOLAPAXY N/A 02/27/2022   Procedure: CYSTOSCOPY;  Surgeon: Abbie Sons, MD;  Location: [facility] ORS;  Service: Urology;  Laterality: N/A;   NECK SURGERY  1976   PROSTATE BIOPSY N/A 02/27/2022   Procedure: PROSTATE BIOPSY;  Surgeon: Abbie Sons, MD;  Location: [facility] ORS;  Service: Urology;  Laterality: N/A;    Home Medications:  Allergies as of 08/08/2022   No Known Allergies      Medication List        Accurate as of August 08, 2022 11:59 PM. If you have any questions, ask your nurse or doctor.          Cholecalciferol 25  MCG (1000 UT) tablet Take by mouth.   diltiazem 60 MG tablet Commonly known as: CARDIZEM Take by mouth.   docusate sodium 100 MG capsule Commonly known as: COLACE Take 100 mg by mouth daily as needed.   gabapentin 300 MG capsule Commonly known as: NEURONTIN Take 300 mg by mouth at bedtime.   midodrine 2.5 MG tablet Commonly known as: PROAMATINE Take 2.5 mg by mouth daily.   potassium chloride 10 MEQ tablet Commonly known as: KLOR-CON Take by mouth.   tamsulosin 0.4 MG Caps capsule Commonly known as: FLOMAX Take 1 capsule (0.4 mg total) by mouth daily.   VITAMIN B 12  PO Take by mouth.        Allergies:  No Known Allergies  Family History: History reviewed. No pertinent family history.  Social History:  reports that he has never smoked. He has never used smokeless tobacco. He reports that he does not currently use alcohol. He reports that he does not use drugs.   Physical Exam: BP 102/73   Pulse (!) 105   Ht '5\' 10"'$  (1.778 m)   Wt 160 lb (72.6 kg)   BMI 22.96 kg/m   Constitutional:  Well nourished. Alert and oriented, No acute distress. HEENT: [name] AT, moist mucus membranes.  Trachea midline Cardiovascular: No clubbing, cyanosis, or edema. Respiratory: Normal respiratory effort, no increased work of breathing. Neurologic: Grossly intact, no focal deficits, moving all 4 extremities. Psychiatric: Normal mood and affect.   Laboratory Data: Pending  Pertinent Imaging:  08/08/22 13:59  Scan Result 420    Assessment & Plan:    1. Prostate cancer -PSA is pending -[facility] through [facility]  2. Neurogenic bladder -Explained to the patient and his sister that his symptoms are not due to a persisting urinary tract infection, but they are due to his incomplete bladder emptying and expected side effects of radiation therapy for his prostate cancer -I offered to instruct him in self-catheterization in order to keep his bladder empty, but he deferred -UA is likely colonized -Urine sent for culture in case he develops symptoms of an UTI   Return in about 6 months (around 02/06/2023) for PSA, I PSS and PVR .  [name]   [facility] [address] [place], [place] [address] [phone]  I spent 30 minutes on the day of the encounter to include pre-visit record review, face-to-face time with the patient, and post-visit ordering of tests.

## 2022-08-08 ENCOUNTER — Ambulatory Visit: Payer: Medicare (Managed Care) | Admitting: Urology

## 2022-08-08 ENCOUNTER — Encounter: Payer: Self-pay | Admitting: Urology

## 2022-08-08 VITALS — BP 102/73 | HR 105 | Ht 70.0 in | Wt 160.0 lb

## 2022-08-08 DIAGNOSIS — N319 Neuromuscular dysfunction of bladder, unspecified: Secondary | ICD-10-CM | POA: Diagnosis not present

## 2022-08-08 DIAGNOSIS — C61 Malignant neoplasm of prostate: Secondary | ICD-10-CM

## 2022-08-08 LAB — BLADDER SCAN AMB NON-IMAGING: Scan Result: 420

## 2022-08-08 LAB — URINALYSIS, COMPLETE
Bilirubin, UA: NEGATIVE
Glucose, UA: NEGATIVE
Ketones, UA: NEGATIVE
Nitrite, UA: NEGATIVE
Specific Gravity, UA: 1.02 (ref 1.005–1.030)
Urobilinogen, Ur: 0.2 mg/dL (ref 0.2–1.0)
pH, UA: 6.5 (ref 5.0–7.5)

## 2022-08-08 LAB — MICROSCOPIC EXAMINATION: WBC, UA: >30 /hpf — AB (ref 0–5)

## 2022-08-09 ENCOUNTER — Telehealth: Payer: Self-pay | Admitting: *Deleted

## 2022-08-09 LAB — PSA: Prostate Specific Ag, Serum: 0.1 ng/mL (ref 0.0–4.0)

## 2022-08-09 NOTE — Telephone Encounter (Signed)
-----   Message from Nori Riis, PA-C sent at 08/09/2022  9:43 AM EST ----- Please let Mr. Staiger know that his PSA is 0.1 which is excellent.  It means the prostate cancer is under good biochemical control.  You

## 2022-08-09 NOTE — Telephone Encounter (Signed)
Notified patient as instructed,.  

## 2022-08-11 LAB — CULTURE, URINE COMPREHENSIVE

## 2022-08-16 ENCOUNTER — Ambulatory Visit: Payer: Medicare (Managed Care) | Admitting: Physician Assistant

## 2022-08-17 ENCOUNTER — Telehealth: Payer: Self-pay | Admitting: *Deleted

## 2022-08-17 NOTE — Telephone Encounter (Signed)
Dr. Judeen Hammans @ [facility] calling wanting to speak with Larene Beach about the patient's urinary retention and urine culture results, you can call her cell at [phone] or the clinic at [phone]

## 2022-09-26 ENCOUNTER — Telehealth: Payer: Self-pay | Admitting: Urology

## 2022-09-26 NOTE — Telephone Encounter (Signed)
I spoke with Dr. Mohammed Kindle at [facility] regarding Mr. Geissler recurrent urinary tract infections and incomplete bladder emptying.  Will go ahead and start him on Hiprex and while he is at the [facility] on Monday through Friday hopefully he will allow [place] to scan his bladder and straight catheterize him if his PVRs are over 300.

## 2022-10-03 ENCOUNTER — Inpatient Hospital Stay: Payer: Medicare (Managed Care) | Attending: Radiation Oncology

## 2022-10-03 DIAGNOSIS — C61 Malignant neoplasm of prostate: Secondary | ICD-10-CM | POA: Diagnosis present

## 2022-10-03 LAB — PSA: Prostatic Specific Antigen: 0.1 ng/mL (ref 0.00–4.00)

## 2022-10-04 ENCOUNTER — Other Ambulatory Visit: Payer: Self-pay | Admitting: Family Medicine

## 2022-10-04 DIAGNOSIS — R339 Retention of urine, unspecified: Secondary | ICD-10-CM

## 2022-10-10 ENCOUNTER — Other Ambulatory Visit: Payer: Self-pay | Admitting: *Deleted

## 2022-10-10 ENCOUNTER — Ambulatory Visit
Admission: RE | Admit: 2022-10-10 | Discharge: 2022-10-10 | Disposition: A | Discharge status: Home / Self Care | Payer: Medicare (Managed Care) | Source: Ambulatory Visit | Attending: Radiation Oncology | Admitting: Radiation Oncology

## 2022-10-10 ENCOUNTER — Encounter: Payer: Self-pay | Admitting: Radiation Oncology

## 2022-10-10 VITALS — BP 117/82 | HR 63 | Temp 96.8°F | Resp 16 | Ht 70.0 in

## 2022-10-10 DIAGNOSIS — C61 Malignant neoplasm of prostate: Secondary | ICD-10-CM

## 2022-10-10 NOTE — Progress Notes (Signed)
Radiation Oncology Follow up Note  Name: [name]   Date:   10/10/2022 MRN:  [id number] DOB: [birth year]    This 67 y.o. 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  REFERRING PROVIDER: Gwendlyn Deutscher, MD  HPI: Patient is a wheelchair-bound 67 year old male now out 4 months have included IMRT radiation therapy to his prostate for stage IIc Gleason 7 adenocarcinoma.  He has a C7 spinal cord injury resulting in quadriplegia.  He is seen today in routine follow-up and is doing well he says his urinary symptoms wax and wane he is doing fairly well with that at this time.  He is having no specific diarrhea.  He has a decubitus ulcer which plastic surgery is evaluating the next several weeks.  His most recent PSA is 0.10.Marland Kitchen  COMPLICATIONS OF TREATMENT: none  FOLLOW UP COMPLIANCE: keeps appointments   PHYSICAL EXAM:  BP 117/82   Pulse 63   Temp (!) 96.8 F (36 C)   Resp 16   Ht  (1.778 m)   BMI 22.96 kg/m  Wheelchair-bound male in NAD.  Lungs are clear to AMP cardiac examination essentially unremarkable abdomen is benign.  RADIOLOGY RESULTS: No current films for review  PLAN: Present time patient is doing well under excellent biochemical control of his prostate cancer.  On pleased with his overall progress.  I have asked to see him back in 6 months with a repeat PSA.  Patient knows to call with any concerns.  I would like to take this opportunity to thank you for allowing me to participate in the care of your patient.Carmina Miller, MD

## 2022-10-19 ENCOUNTER — Ambulatory Visit
Admission: RE | Admit: 2022-10-19 | Discharge: 2022-10-19 | Disposition: A | Discharge status: Home / Self Care | Payer: Medicare (Managed Care) | Source: Ambulatory Visit | Attending: Family Medicine | Admitting: Family Medicine

## 2022-10-19 DIAGNOSIS — Z8546 Personal history of malignant neoplasm of prostate: Secondary | ICD-10-CM | POA: Diagnosis not present

## 2022-10-19 DIAGNOSIS — R339 Retention of urine, unspecified: Secondary | ICD-10-CM | POA: Diagnosis present

## 2022-10-19 DIAGNOSIS — N281 Cyst of kidney, acquired: Secondary | ICD-10-CM | POA: Diagnosis not present

## 2022-10-19 DIAGNOSIS — R39198 Other difficulties with micturition: Secondary | ICD-10-CM | POA: Diagnosis not present

## 2023-01-17 ENCOUNTER — Ambulatory Visit: Payer: Medicare (Managed Care) | Admitting: Urology

## 2023-04-03 ENCOUNTER — Inpatient Hospital Stay: Payer: Medicare (Managed Care) | Attending: Radiation Oncology

## 2023-04-03 DIAGNOSIS — C61 Malignant neoplasm of prostate: Secondary | ICD-10-CM | POA: Diagnosis present

## 2023-04-03 LAB — PSA: Prostatic Specific Antigen: 0.03 ng/mL (ref 0.00–4.00)

## 2023-04-10 ENCOUNTER — Encounter: Payer: Self-pay | Admitting: Radiation Oncology

## 2023-04-10 ENCOUNTER — Ambulatory Visit
Admission: RE | Admit: 2023-04-10 | Discharge: 2023-04-10 | Disposition: A | Discharge status: Home / Self Care | Payer: Medicare (Managed Care) | Source: Ambulatory Visit | Attending: Radiation Oncology | Admitting: Radiation Oncology

## 2023-04-10 VITALS — BP 111/76 | HR 63 | Temp 96.3°F | Resp 16

## 2023-04-10 DIAGNOSIS — Z923 Personal history of irradiation: Secondary | ICD-10-CM | POA: Insufficient documentation

## 2023-04-10 DIAGNOSIS — C61 Malignant neoplasm of prostate: Secondary | ICD-10-CM | POA: Diagnosis present

## 2023-04-10 NOTE — Progress Notes (Signed)
Radiation Oncology Follow up Note  Name: [name]   Date:   04/10/2023 MRN:  [id number] DOB: 27-Jul-1955    This 67 y.o. male presents to the clinic today for 1-month follow-up status post IMRT radiation therapy for Gleason 7 (4+3) adenocarcinoma the prostate.  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 He initially presented with a PSA of 20. He reports feeling "all right" and has recently recovered from a urinary tract infection. Urinary frequency has improved and he no longer requires catheterization. Bowel function is reported as "great." He is currently being followed by a urologist and a physician assistant..  COMPLICATIONS OF TREATMENT: none  FOLLOW UP COMPLIANCE: keeps appointments   PHYSICAL EXAM:  BP 111/76   Pulse 63   Temp (!) 96.3 F (35.7 C)   Resp 16  Wheelchair-bound male in NAD.  Well-developed well-nourished patient in NAD. HEENT reveals PERLA, EOMI, discs not visualized.  Oral cavity is clear. No oral mucosal lesions are identified. Neck is clear without evidence of cervical or supraclavicular adenopathy. Lungs are clear to A&P. Cardiac examination is essentially unremarkable with regular rate and rhythm without murmur rub or thrill. Abdomen is benign with no organomegaly or masses noted. Motor sensory and DTR levels are equal and symmetric in the upper and lower extremities. Cranial nerves II through XII are grossly intact. Proprioception is intact. No peripheral adenopathy or edema is identified. No motor or sensory levels are noted. Crude visual fields are within normal range.  RADIOLOGY RESULTS: No current films for review  PLAN:pr ostate Adenocarcinoma (Gleason 7, Stage II) Completed 10 months ago IMRT radiation therapy. Current PSA is 0.03, which is excellent. -Continue to  monitor PSA levels. -Return in 6 months for repeat PSA. If stable, extend follow-up to annually.  Urinary Tract Infection Resolved. -No further action required at this time.    Carmina Miller, MD

## 2023-04-29 ENCOUNTER — Other Ambulatory Visit: Payer: Self-pay | Admitting: Family Medicine

## 2023-04-29 DIAGNOSIS — Z933 Colostomy status: Secondary | ICD-10-CM

## 2023-04-29 DIAGNOSIS — K5900 Constipation, unspecified: Secondary | ICD-10-CM

## 2023-08-24 ENCOUNTER — Other Ambulatory Visit: Payer: Self-pay

## 2023-08-24 ENCOUNTER — Emergency Department
Admission: EM | Admit: 2023-08-24 | Discharge: 2023-08-24 | Disposition: A | Discharge status: Home / Self Care | Payer: Medicare (Managed Care) | Attending: Emergency Medicine | Admitting: Emergency Medicine

## 2023-08-24 DIAGNOSIS — Z436 Encounter for attention to other artificial openings of urinary tract: Secondary | ICD-10-CM | POA: Diagnosis present

## 2023-08-24 DIAGNOSIS — Z960 Presence of urogenital implants: Secondary | ICD-10-CM | POA: Insufficient documentation

## 2023-08-24 DIAGNOSIS — Z435 Encounter for attention to cystostomy: Secondary | ICD-10-CM

## 2023-08-24 DIAGNOSIS — Z9359 Other cystostomy status: Secondary | ICD-10-CM

## 2023-08-24 LAB — URINALYSIS, ROUTINE W REFLEX MICROSCOPIC
Bilirubin Urine: NEGATIVE
Glucose, UA: NEGATIVE mg/dL
Ketones, ur: NEGATIVE mg/dL
Nitrite: NEGATIVE
Protein, ur: 100 mg/dL — AB
Specific Gravity, Urine: 1.005 (ref 1.005–1.030)
Squamous Epithelial / HPF: 0 /HPF (ref 0–5)
WBC, UA: >50 WBC/hpf (ref 0–5)
pH: 8 (ref 5.0–8.0)

## 2023-08-24 IMAGING — MR MR PROSTATE WO/W CM
10 of 12 series · 44 of 48 positions shown · IV contrast (gadavist)
Comparison: None Available.

CLINICAL DATA: Elevated PSA level

EXAM:
MR PROSTATE WITHOUT AND WITH CONTRAST
TECHNIQUE: Multiplanar multisequence MRI images were obtained of the pelvis
centered about the prostate. Pre and post contrast images were
obtained.
CONTRAST:  7mL GADAVIST GADOBUTROL 1 MMOL/ML IV SOLN

[Series 3: ax in&out whole · axial · 3.0mm · 1.19mm/px · 1 of 88 slices shown (1 of 2)]
[im 1/88]
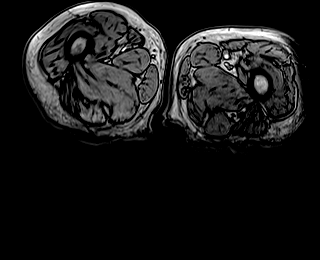

[Series 4: ax in&out whole · axial · 3.0mm · 1.19mm/px · 1 of 88 slices shown (2 of 2)]
[im 1/88]
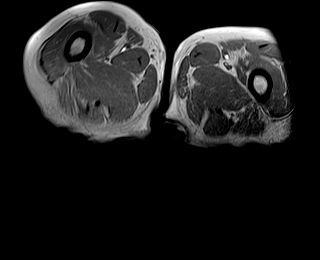

[Series 5: T2 · coronal · 3.0mm · 0.70mm/px · 1 of 35 slices shown (1 of 3)]
[im 1/35]
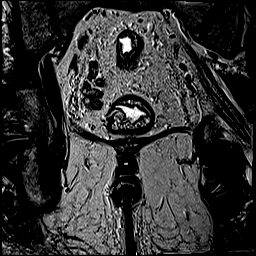

[Series 6: T2 · axial · 3.0mm · 0.56mm/px · 1 of 26 slices shown (2 of 3)]
[im 1/26]
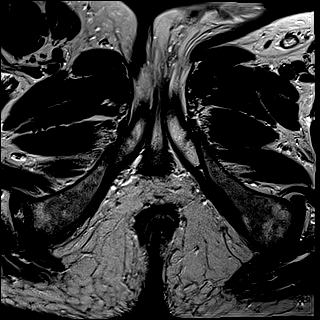

[Series 7: DWI · axial · 3.0mm · 0.86mm/px · z∈[-140,-70]mm · 2 of 75 slices shown (1 of 3)]
[im 1/75]
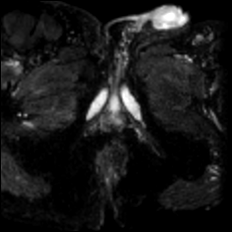
[im 75/75]
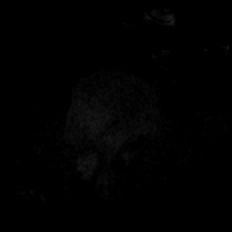

[Series 8: DWI · axial · 3.0mm · 0.86mm/px · 1 of 25 slices shown (2 of 3)]
[im 1/25]
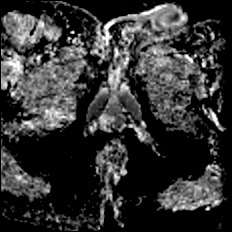

[Series 9: DWI · axial · 3.0mm · 0.86mm/px · 1 of 25 slices shown (3 of 3)]
[im 1/25]
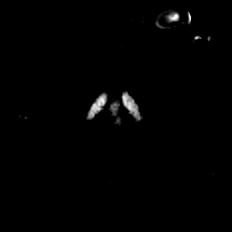

[Series 10: T2 · axial · 1.0mm · 1.04mm/px · z∈[-140,-70]mm · 2 of 72 slices shown (3 of 3)]
[im 1/72]
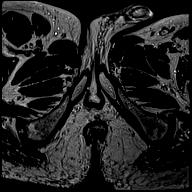
[im 72/72]
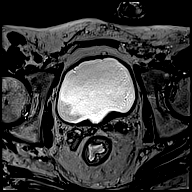

[Series 11: T1 · axial · 3.0mm · 1.15mm/px · z∈[-147,-68]mm · 17 of 700 slices shown (1 of 2)]
[im 1/700]
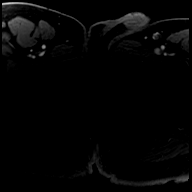
[im 44/700]
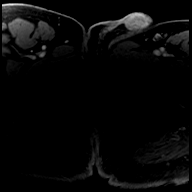
[im 88/700]
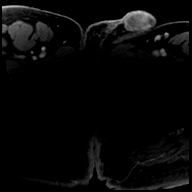
[im 132/700]
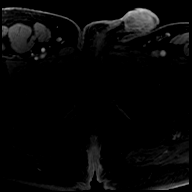
[im 175/700]
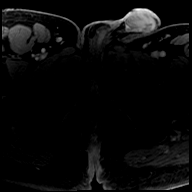
[im 219/700]
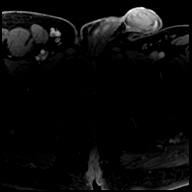
[im 263/700]
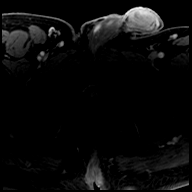
[im 306/700]
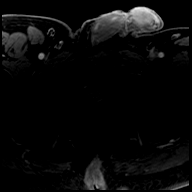
[im 350/700]
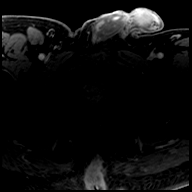
[im 394/700]
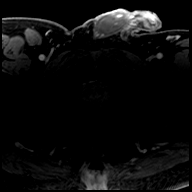
[im 437/700]
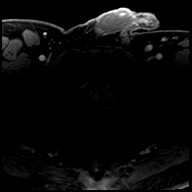
[im 481/700]
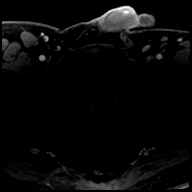
[im 525/700]
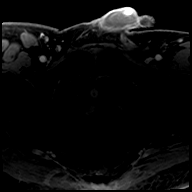
[im 568/700]
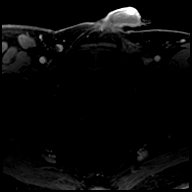
[im 612/700]
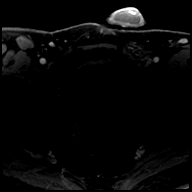
[im 656/700]
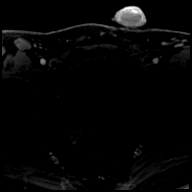
[im 700/700]
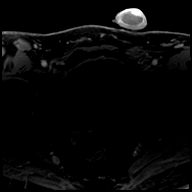

[Series 12: T1 · axial · 3.0mm · 1.15mm/px · z∈[-147,-68]mm · 17 of 671 slices shown (2 of 2)]
[im 1/671]
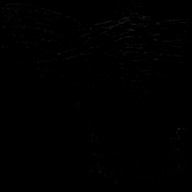
[im 42/671]
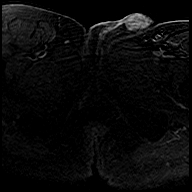
[im 84/671]
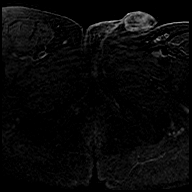
[im 126/671]
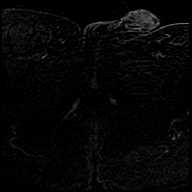
[im 168/671]
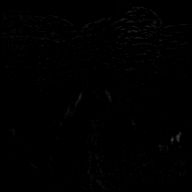
[im 210/671]
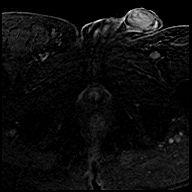
[im 252/671]
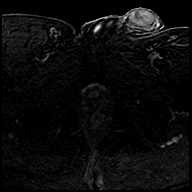
[im 294/671]
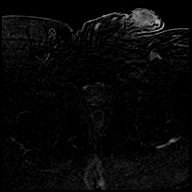
[im 336/671]
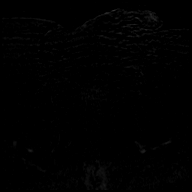
[im 377/671]
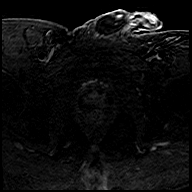
[im 419/671]
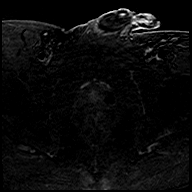
[im 461/671]
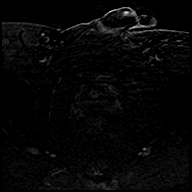
[im 503/671]
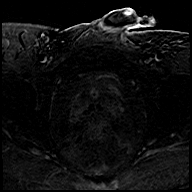
[im 545/671]
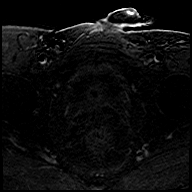
[im 587/671]
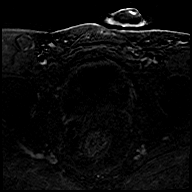
[im 629/671]
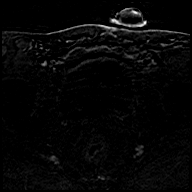
[im 671/671]
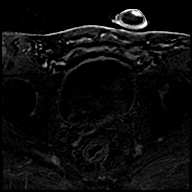

[44 of 48 positions shown; findings below may reference images not displayed]

FINDINGS: Timing error resulted in contrast injection prior to precontrast
images. As result, subtraction images should be disregarded.

Prostate:

Region of interest # 1: PI-RADS category 5 lesion of the entire left
peripheral zone at the base and mid gland, and the left
posterolateral peripheral zone at the apex, with extensive reduced
T2 signal (image 28, series 10), reduced ADC map activity (image 10,
series 8), restricted diffusion (image 10, series 9), and focal
early enhancement (image 11, series 11). This measures 5.43 cc (3.8
by 1.6 by 2.0 cm) and has substantial capsular contact placing it at
risk of transcapsular spread.

Region of interest # 2: PI-RADS category 5 lesion of the left
anterior transition zone in the mid gland and base, right anterior
transition zone at the base, and bilateral anterior fibromuscular
stroma extending from the apex through the base, with indistinct
reduced T2 signal (image 30, series 10), reduced ADC map activity
and restricted diffusion (image 11 of series 8 and 9), and diffuse
early enhancement (image 13, series 11). This measures 2.59 cc (1.9
by 1.0 by 1.9 cm).

Region of interest # 3: PI-RADS category 5 lesion of the right
anterior transition zone and right anterior fibromuscular stroma in
the mid gland with reduced T2 signal, reduced ADC map activity and
restricted diffusion (image 12 of series 8 and 9), and early
enhancement (image 14, series 11). This measures 0.82 cc (1.6 by
by 0.7 cm).

Volume: 3D volumetric analysis: Prostate volume 28.96 cc (5.3 by
by 3.8 cm).

Transcapsular spread: Likely present especially along region of
interest # 1 given the amount of capsular contact.

Seminal vesicle involvement: Not observed

Neurovascular bundle involvement: Not observed

Pelvic adenopathy: Not observed

Bone metastasis: Absent

Other findings: Cystic lesion below the expected location of the
left kidney, probably a renal cyst, not completely assessed.
IMPRESSION: 1. Three PI-RADS category 5 lesions are present in the prostate
gland compatible with malignancy. Region of interest # 1 has a large
amount of capsular contact raising the risk of transcapsular spread.
Targeting data sent to [facility].

## 2023-08-24 NOTE — ED Notes (Signed)
 First nurse note: From home [facility] for suprapubic foley leaking from site. Was not draining this AM per home health nurse who called, but began flowing when they moved him to stretcher. Urine is cloudy and "chunky" per EMS.  EMS VS: [facility] 181/135, took BP med 1 hour before EMS arrived HR 128 97% RA 98.4 temp No pain

## 2023-08-24 NOTE — ED Notes (Signed)
 Called [facility] transport spoke with Onalee Hua it will be 2:00 am before someone can pick up and they need an insurance that they accept  told Onalee Hua we will have to call back  so the patient is not on the wait list due to insurance

## 2023-08-24 NOTE — ED Triage Notes (Addendum)
 Pt has a suprapubic catheter that is due to be changed and pt states he thinks it's clogged. 300 ml's light yellow urine noted in bag at this time with white sediment, pt states home health nurse changed bag this morning but he's noticed sediment draining out today. Pt is AOX4, NAD noted, pt denies pain.

## 2023-08-24 NOTE — ED Notes (Signed)
 Ems was called they will transport

## 2023-08-24 NOTE — ED Provider Notes (Signed)
 [facility] Emergency Department Provider Note     Event Date/Time   First MD Initiated Contact with Patient 08/24/23 1320     (approximate)   History   foley catheter problem   HPI  [name] is a 68 y.o. male with a chronic indwelling suprapubic cath secondary to quadriplegia and neurogenic bladder, presents to the ED requesting change out.  He would also endorse some intermittent clogging to the catheter.  300 mL of yellow urine is noted in the bag at this time with some intermittent white sediment noted.  Patient's home health nurse changed the bag this morning but the catheter has not been changed since last month.  Patient and his sister who is his roommate and caregiver, believe the Foley catheter should be flushed daily.  Sister is not inclined or interested in performing catheter care for her brother.  Patient has a wound care nurse that comes daily to change his sacral wounds, and by their report, she would be willing to perform catheter care when she is in the home daily.  Apparently that order needs to come from Dr. Darreld Mclean.  I discussed with the family that that information likely needs to be sure Dr. Marvis Moeller directly if that is their preference.  Patient denies any fevers, chills, or sweats.  Physical Exam   Triage Vital Signs: ED Triage Vitals  Encounter Vitals Group     BP 08/24/23 1134 (!) 83/66     Systolic BP Percentile --      Diastolic BP Percentile --      Pulse Rate 08/24/23 1134 63     Resp 08/24/23 1134 16     Temp 08/24/23 1134 98.6 F (37 C)     Temp Source 08/24/23 1134 Oral     SpO2 08/24/23 1134 98 %     Weight --      Height --      Head Circumference --      Peak Flow --      Pain Score 08/24/23 1135 0     Pain Loc --      Pain Education --      Exclude from Growth Chart --     Most recent vital signs: Vitals:   08/24/23 1134 08/24/23 1621  BP: (!) 83/66 (!) 85/71  Pulse: 63 70  Resp: 16 18  Temp: 98.6  F (37 C) 98.6 F (37 C)  SpO2: 98% 100%    General Awake, no distress. NAD HEENT NCAT. PERRL. EOMI. No rhinorrhea. Mucous membranes are moist. CV:  Good peripheral perfusion.  RESP:  Normal effort.  ABD:  No distention.  GU:  Suprapubic Foley catheter in place.  There is some Material noted at the base of the exposed tube.  Some purulent tenderness noted in the tubing.   ED Results / Procedures / Treatments   Labs (all labs ordered are listed, but only abnormal results are displayed) Labs Reviewed  URINALYSIS, ROUTINE W REFLEX MICROSCOPIC - Abnormal; Notable for the following components:      Result Value   Color, Urine STRAW (*)    APPearance HAZY (*)    Hgb urine dipstick MODERATE (*)    Protein, ur 100 (*)    Leukocytes,Ua LARGE (*)    Bacteria, UA RARE (*)    All other components within normal limits  URINE CULTURE     EKG   RADIOLOGY   No results found.   PROCEDURES:  Critical  Care performed: No  ASPIRATION BLADDER INSERT SUPRAPUBIC CATHETER  Date/Time: 08/24/2023 4:21 PM  Performed by: Lissa Hoard, PA-C Authorized by: Lissa Hoard, PA-C  Consent: Verbal consent obtained. Risks and benefits: risks, benefits and alternatives were discussed Consent given by: patient Patient understanding: patient states understanding of the procedure being performed Site marked: the operative site was marked Required items: required blood products, implants, devices, and special equipment available Patient identity confirmed: verbally with patient Preparation: Patient was prepped and draped in the usual sterile fashion. Local anesthesia used: no  Anesthesia: Local anesthesia used: no  Sedation: Patient sedated: no  Patient tolerance: patient tolerated the procedure well with no immediate complications Comments: 73 French Foley catheter placed into the suprapubic ostomy without difficulty.  Foley balloon was inflated with 10 cc of sterile  water.  Return of a scant amount of clear urine into the bag.   MEDICATIONS ORDERED IN ED: Medications - No data to display   IMPRESSION / MDM / ASSESSMENT AND PLAN / ED COURSE  I reviewed the triage vital signs and the nursing notes.                              Differential diagnosis includes, but is not limited to, suprapubic catheter dysfunction, suprapubic catheter migration, catheter blockage, catheter replacement  Patient's presentation is most consistent with acute presentation with potential threat to life or bodily function.  Patient's diagnosis is consistent with encounter for suprapubic catheter replacement.  Patient with a chronic indwelling suprapubic cath, presents to the ED for routine replacement.  He did report intermittent mentation, that does cause some decreased outflow.  Patient is unable and family is unwilling to perform daily catheter care.  Patient and family have been advised to make contact with Dr. Darreld Mclean, to request daily catheter care with the in-home wound care nurse who is present daily for wound care.  Patient is to follow up with his primary provider or urologist as discussed, as scheduled, as needed or otherwise directed. Patient is given ED precautions to return to the ED for any worsening or new symptoms.   FINAL CLINICAL IMPRESSION(S) / ED DIAGNOSES   Final diagnoses:  Chronic suprapubic catheter (HCC)  Encounter for suprapubic catheter care [facility])     Rx / DC Orders   ED Discharge Orders     None        Note:  This document was prepared using Dragon voice recognition software and may include unintentional dictation errors.Karmen Stabs, Charlesetta Ivory, PA-C 08/24/23 1639    Phineas Semen, MD 08/24/23 [phone]

## 2023-08-24 NOTE — Discharge Instructions (Addendum)
 Follow-up with your urologist as scheduled.  Follow-up with Dr. Marvis Moeller for request for daily routine catheter care as discussed.

## 2023-08-27 LAB — URINE CULTURE

## 2023-09-01 ENCOUNTER — Emergency Department
Admission: EM | Admit: 2023-09-01 | Discharge: 2023-09-01 | Disposition: A | Discharge status: Home / Self Care | Payer: Medicare (Managed Care) | Attending: Emergency Medicine | Admitting: Emergency Medicine

## 2023-09-01 ENCOUNTER — Other Ambulatory Visit: Payer: Self-pay

## 2023-09-01 DIAGNOSIS — T83010A Breakdown (mechanical) of cystostomy catheter, initial encounter: Secondary | ICD-10-CM

## 2023-09-01 DIAGNOSIS — T83091A Other mechanical complication of indwelling urethral catheter, initial encounter: Secondary | ICD-10-CM | POA: Insufficient documentation

## 2023-09-01 DIAGNOSIS — Y846 Urinary catheterization as the cause of abnormal reaction of the patient, or of later complication, without mention of misadventure at the time of the procedure: Secondary | ICD-10-CM | POA: Diagnosis not present

## 2023-09-01 DIAGNOSIS — Z8546 Personal history of malignant neoplasm of prostate: Secondary | ICD-10-CM | POA: Insufficient documentation

## 2023-09-01 NOTE — ED Triage Notes (Signed)
 Pt to ed from home via [facility] for suprapubic catheter issues. Pt was seen for same thing on 3/8. Pt is alert and in no ac distress. This is an ongoing issue. Vitals for EMS.  128/78 80 HX of Afib

## 2023-09-01 NOTE — ED Notes (Signed)
 [facility] will be transporting patient back home

## 2023-09-01 NOTE — ED Notes (Signed)
 See triage notes. Patient c/o suprapubic catheter not working correctly and that urine is coming from the penis.

## 2023-09-01 NOTE — ED Provider Notes (Addendum)
   [facility] Provider Note    Event Date/Time   First MD Initiated Contact with Patient 09/01/23 1616     (approximate)   History   catheter issue   HPI  [name] is a 68 y.o. male with history of quadriplegia, neurogenic bladder, prostate cancer with suprapubic catheter.  He feels that it may be clogged because it is not draining.  Review of records demonstrates that was recently changed on March 8 He has no complaints otherwise     Physical Exam   Triage Vital Signs: ED Triage Vitals  Encounter Vitals Group     BP 09/01/23 1605 101/69     Systolic BP Percentile --      Diastolic BP Percentile --      Pulse Rate 09/01/23 1605 66     Resp 09/01/23 1605 17     Temp 09/01/23 1605 99 F (37.2 C)     Temp src --      SpO2 09/01/23 1605 96 %     Weight 09/01/23 1607 72 kg (158 lb 11.7 oz)     Height 09/01/23 1607 1.778 m (5\' 10" )     Head Circumference --      Peak Flow --      Pain Score 09/01/23 1607 0     Pain Loc --      Pain Education --      Exclude from Growth Chart --     Most recent vital signs: Vitals:   09/01/23 1605  BP: 101/69  Pulse: 66  Resp: 17  Temp: 99 F (37.2 C)  SpO2: 96%     General: Awake, no distress.  CV:  Good peripheral perfusion.  Resp:  Normal effort.  Abd:  No distention.  Suprapubic catheter is in place, no abdominal distention, no leakage around the catheter Other:     ED Results / Procedures / Treatments   Labs (all labs ordered are listed, but only abnormal results are displayed) Labs Reviewed - No data to display   EKG     RADIOLOGY     PROCEDURES:  Critical Care performed:   Procedures   MEDICATIONS ORDERED IN ED: Medications - No data to display   IMPRESSION / MDM / ASSESSMENT AND PLAN / ED COURSE  I reviewed the triage vital signs and the nursing notes. Patient's presentation is most consistent with acute, uncomplicated illness.  We are able to easily flush the  catheter demonstrating that is not obstructed.  Patient notes that he frequently does urinate from his penis recently, suspect this is why he believed his tube may be obstructed.  No indication for tube change at this time, will have the patient follow-up with his urologist  Labs performed 5 days ago were unremarkable      FINAL CLINICAL IMPRESSION(S) / ED DIAGNOSES   Final diagnoses:  Suprapubic catheter dysfunction, initial encounter [facility])     Rx / DC Orders   ED Discharge Orders     None        Note:  This document was prepared using Dragon voice recognition software and may include unintentional dictation errors.   Jene Every, MD 09/01/23 Jovita Gamma    Jene Every, MD 09/01/23 [phone]

## 2023-09-06 IMAGING — CT CT PELVIS W/ CM
2 of 3 series · 17 of 46 positions shown, 19 images · IV contrast (APPLIED)
Comparison: None

CLINICAL DATA: Concern for soft tissue infection. Bleeding wound on
buttock.

EXAM:
CT PELVIS WITH CONTRAST
TECHNIQUE: Multidetector CT imaging of the pelvis was performed using the
standard protocol following the bolus administration of intravenous
contrast.

[Series 4: soft tissue · axial · 0.91mm/px · z∈[-1175,-887]mm · 14 of 166 slices shown, 16 images]
[im 11/166  soft-tissue]
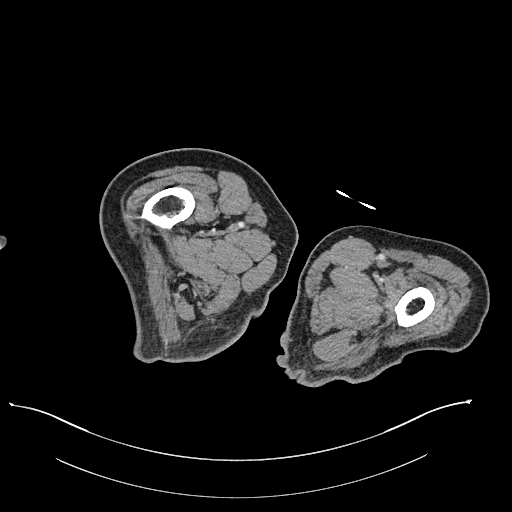
[im 11/166  bone]
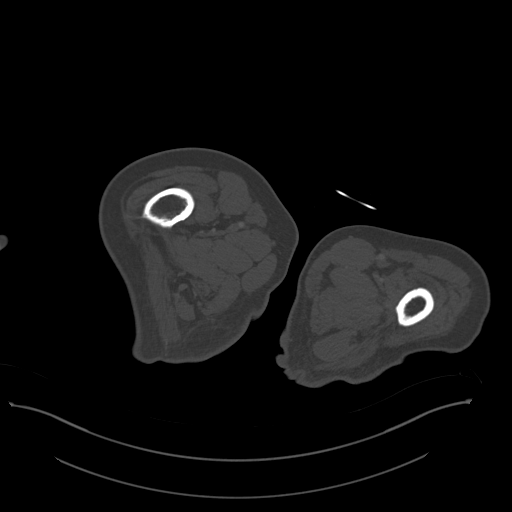
[im 22/166  soft-tissue]
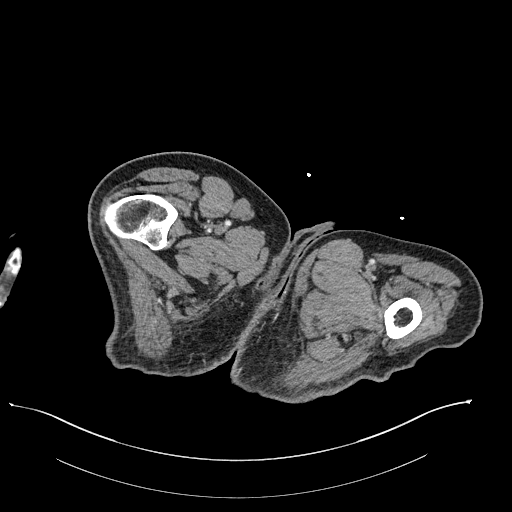
[im 32/166  soft-tissue]
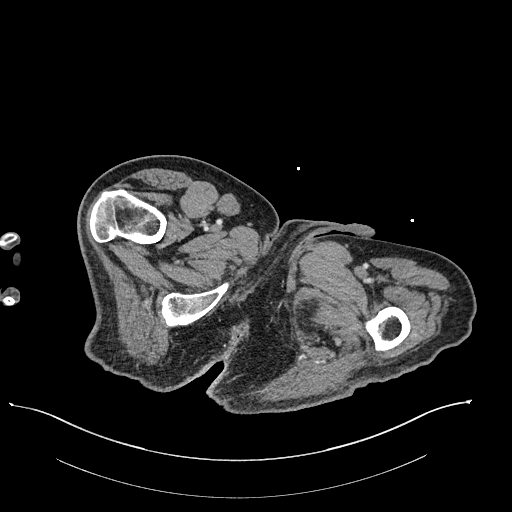
[im 43/166  soft-tissue]
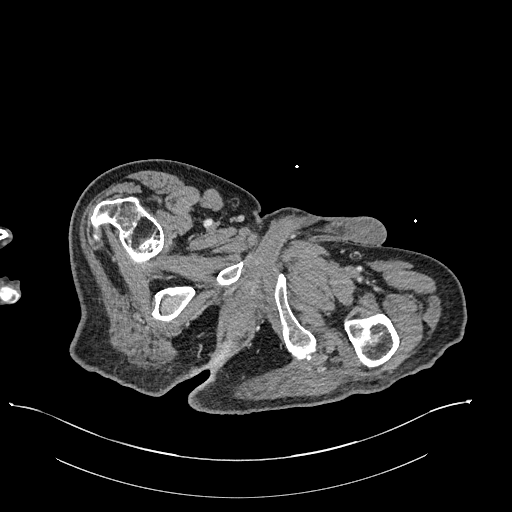
[im 54/166  soft-tissue]
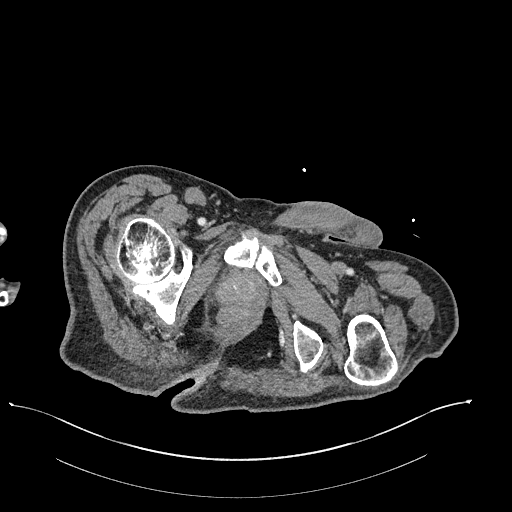
[im 64/166  soft-tissue]
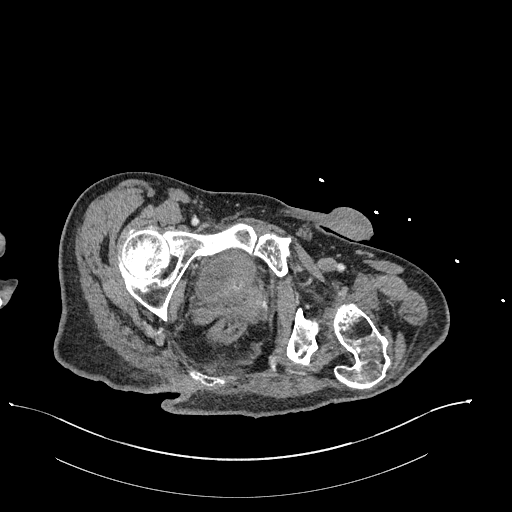
[im 75/166  soft-tissue]
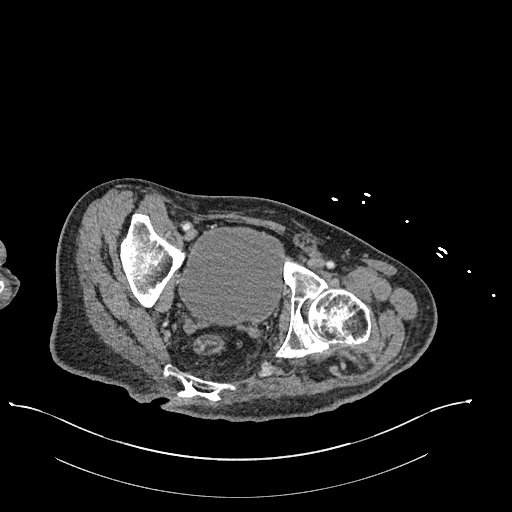
[im 91/166  soft-tissue]
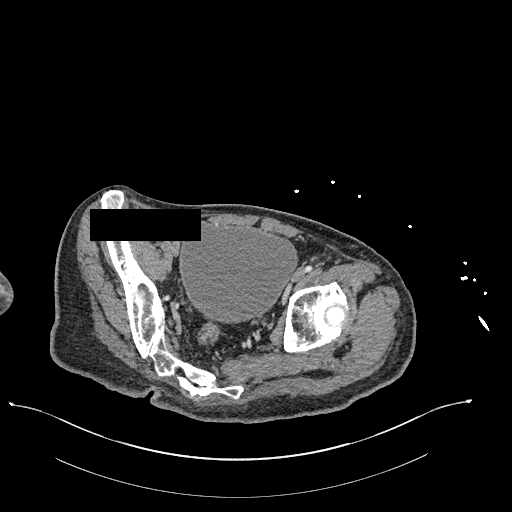
[im 102/166  soft-tissue]
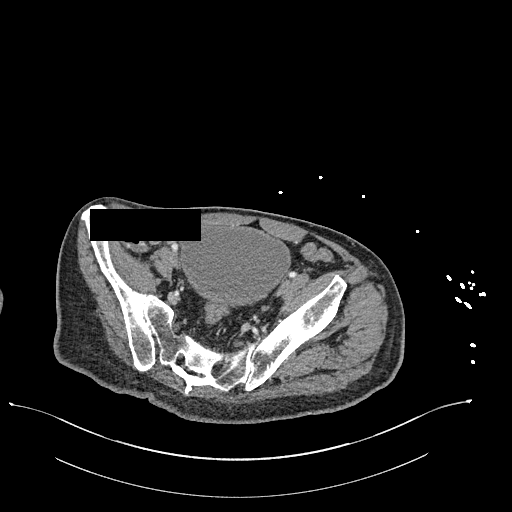
[im 102/166  bone]
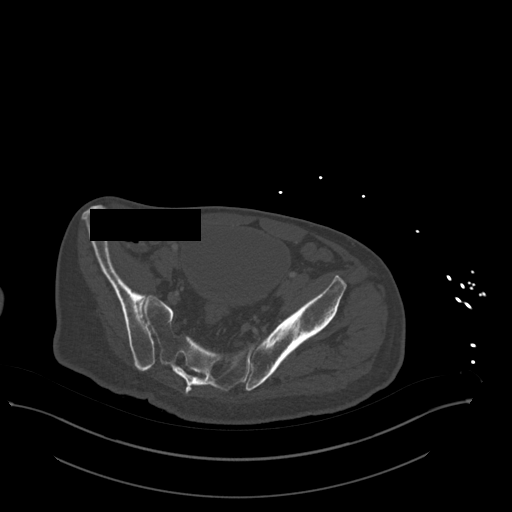
[im 112/166  soft-tissue]
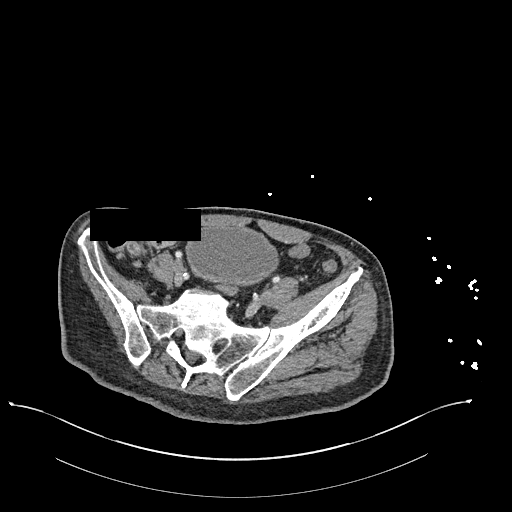
[im 123/166  soft-tissue]
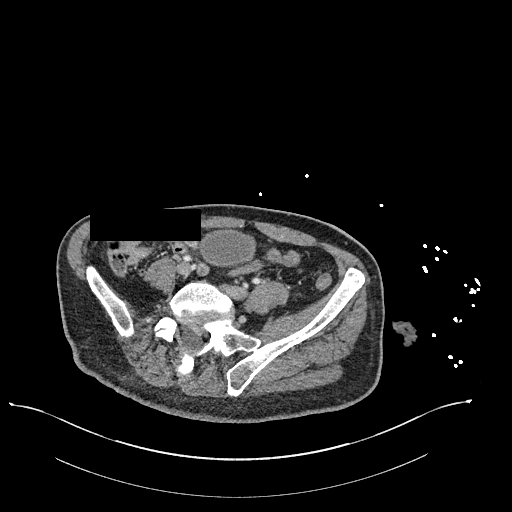
[im 134/166  soft-tissue]
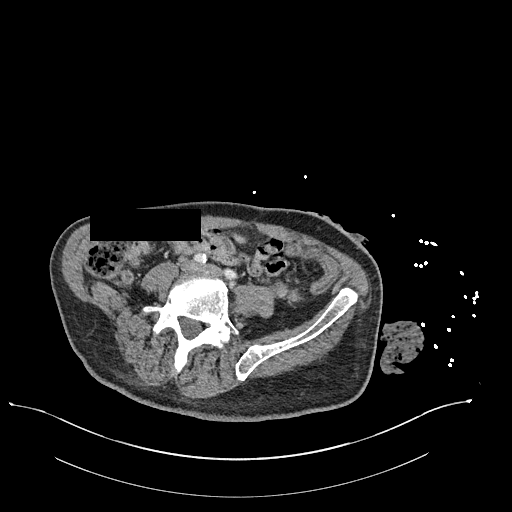
[im 144/166  soft-tissue]
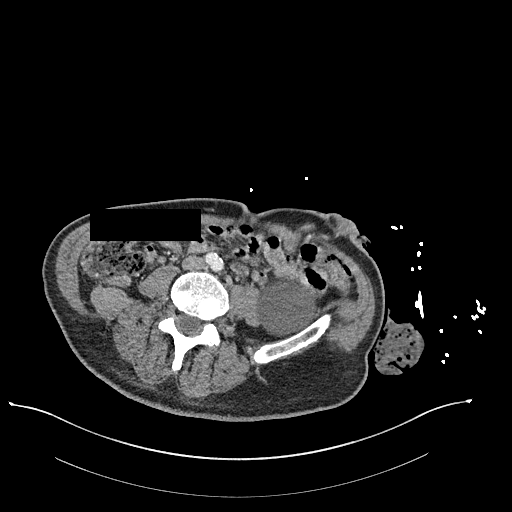
[im 155/166  soft-tissue]
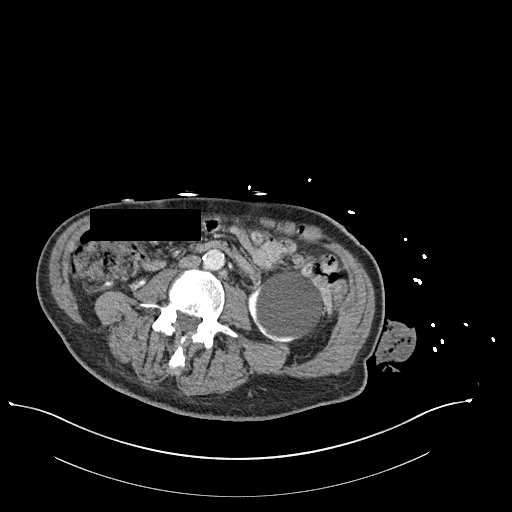

[Series 5: cor soft · coronal · 0.76mm/px · 3 of 113 slices shown]
[im 38/113  soft-tissue]
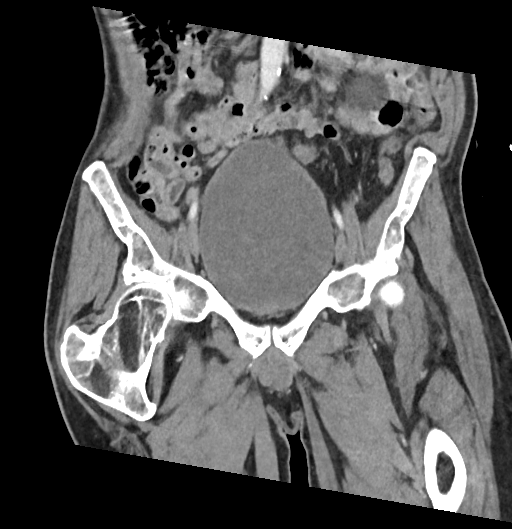
[im 50/113  soft-tissue]
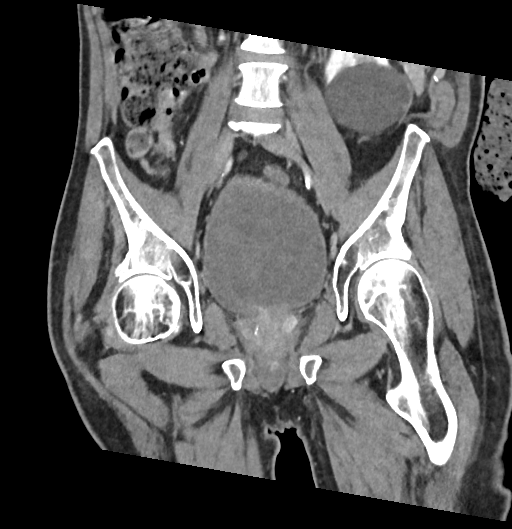
[im 63/113  soft-tissue]
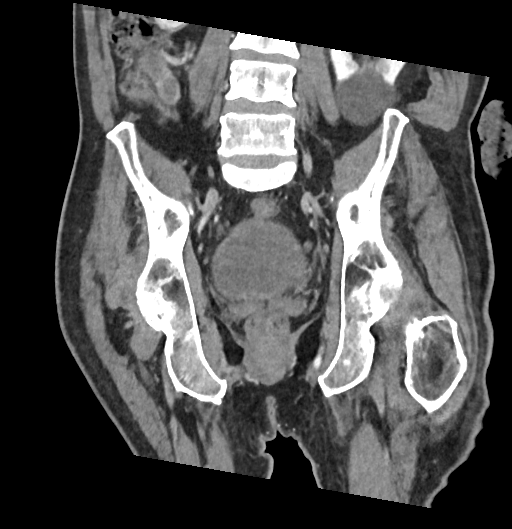

[17 of 46 positions shown; findings below may reference images not displayed]

RADIATION DOSE REDUCTION: This exam was performed according to the
departmental dose-optimization program which includes automated
exposure control, adjustment of the mA and/or kV according to
patient size and/or use of iterative reconstruction technique.

CONTRAST:  100mL OMNIPAQUE IOHEXOL 300 MG/ML  SOLN
FINDINGS: Urinary Tract: Partially visualized left renal inferior pole 6 cm
cyst. Multiple small stones noted layering along the posterior
bladder. Mild thickened and trabeculated appearance of the bladder
wall, likely related to chronic bladder outlet obstruction.

Bowel: Postsurgical changes of the bowel with a left lower quadrant
ostomy. No bowel dilatation within the pelvis. The appendix is
normal.

Vascular/Lymphatic: Mild aortoiliac atherosclerotic disease. No
pelvic adenopathy.

Reproductive: Heterogeneous appearance of the prostate gland. The
prostate was better evaluated on the MRI of 11/21/2021.

Other:  None no drainable fluid collection or abscess.

Musculoskeletal: Osteopenia.  No acute osseous pathology.
IMPRESSION: 1. No acute pelvic pathology. No drainable fluid collection or
abscess.
2. Postsurgical changes of the bowel with a left lower quadrant
ostomy. No bowel dilatation.
3. Aortic Atherosclerosis ([id number]).

## 2023-09-30 ENCOUNTER — Other Ambulatory Visit: Payer: Self-pay | Admitting: Family Medicine

## 2023-09-30 DIAGNOSIS — G8250 Quadriplegia, unspecified: Secondary | ICD-10-CM

## 2023-10-02 ENCOUNTER — Other Ambulatory Visit: Payer: Self-pay | Admitting: *Deleted

## 2023-10-02 DIAGNOSIS — C61 Malignant neoplasm of prostate: Secondary | ICD-10-CM

## 2023-10-09 ENCOUNTER — Inpatient Hospital Stay: Payer: Medicare (Managed Care) | Attending: Radiation Oncology

## 2023-10-09 DIAGNOSIS — C61 Malignant neoplasm of prostate: Secondary | ICD-10-CM | POA: Diagnosis present

## 2023-10-09 LAB — PSA: Prostatic Specific Antigen: 0.01 ng/mL (ref 0.00–4.00)

## 2023-10-16 ENCOUNTER — Encounter: Payer: Self-pay | Admitting: Radiation Oncology

## 2023-10-16 ENCOUNTER — Ambulatory Visit
Admission: RE | Admit: 2023-10-16 | Discharge: 2023-10-16 | Disposition: A | Discharge status: Home / Self Care | Payer: Medicare (Managed Care) | Source: Ambulatory Visit | Attending: Radiation Oncology | Admitting: Radiation Oncology

## 2023-10-16 VITALS — BP 131/71 | HR 69 | Temp 98.3°F

## 2023-10-16 DIAGNOSIS — C61 Malignant neoplasm of prostate: Secondary | ICD-10-CM

## 2023-10-16 NOTE — Progress Notes (Signed)
 Radiation Oncology Follow up Note  Name: [name]   Date:   10/16/2023 MRN:  [id number] DOB: 08-21-1955    This 68 y.o. 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  REFERRING PROVIDER: Nyra Bellis, MD  HPI: Patient is a 68 year old male now seen out over 16 months having completed image guided radiation therapy to his prostate for Gleason 7 (4+3) adenocarcinoma the prostate presenting with a PSA of.  20.  He is seen today in routine follow-up is doing well.  Specifically denies any increased lower urinary tract symptoms diarrhea or fatigue.  He is currently on Flomax  which he does take in the evening.  His PSA remains undetectable at 0.01 down slightly from 0.1 a year ago  COMPLICATIONS OF TREATMENT: none  FOLLOW UP COMPLIANCE: keeps appointments   PHYSICAL EXAM:  BP 131/71   Pulse 69   Temp 98.3 F (36.8 C) (Tympanic)  Well-developed well-nourished patient in NAD. HEENT reveals PERLA, EOMI, discs not visualized.  Oral cavity is clear. No oral mucosal lesions are identified. Neck is clear without evidence of cervical or supraclavicular adenopathy. Lungs are clear to A&P. Cardiac examination is essentially unremarkable with regular rate and rhythm without murmur rub or thrill. Abdomen is benign with no organomegaly or masses noted. Motor sensory and DTR levels are equal and symmetric in the upper and lower extremities. Cranial nerves II through XII are grossly intact. Proprioception is intact. No peripheral adenopathy or edema is identified. No motor or sensory levels are noted. Crude visual fields are within normal range.  RADIOLOGY RESULTS: No current films to review  PLAN: Present time I will see him back in 1 year for follow-up.  Patient continues under excellent biochemical control of his prostate cancer.  And pleased with his overall progress.  Patient knows to call  with any concerns.  I would like to take this opportunity to thank you for allowing me to participate in the care of your patient.Glenis Langdon, MD

## 2024-03-25 NOTE — Progress Notes (Signed)
 Dressing applied per order.  Reviewed all instructions with patient/family member/caregiver via demonstration and written instructions in AVS. Individual receiving information including teach back, repeated instructions and was given time to ask questions and/or provide a return demonstration. Individual verbalized understanding of all info given. No further questions at this time.

## 2024-03-25 NOTE — Progress Notes (Signed)
 Wound Admission History & Physical   Patient I.D. [name] is a 68 y.o. male referred by Dr. Glendia Ingle.    Chief Compliant:  Chief Complaint  Patient presents with  . Wound Check    HPI   [name] is an 68 y.o. 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 He is followed by ID since 2014.  Had 6 weeks of ceftazidime and vancomycin.  Wound got worse in 2023 and had full bone exposure at the base of his wound.  CT of pelvis in 2024 did show increased osseous erosive changes at the sacrococcygeal junction compatible with progressed osteomyelitis.   He has seen [facility] with Dr. Tobie.  He hits all criteria for flap/bony debridement, except getting his nonvoluntary spasms controlled.  He has an offloading surface for wheelchair and bed.  Great support at home. Now with diverting colostomy and suprapubic catheter.   Wound has been clean.  His prealbumin is finally >18 with nutritional supplements.     Screenings: Recent A1c:NA Neuropathy: no Smoking status:current non-smoker, non-tobacco user.  Offloading device:offloading mattress and offloading wheelchair cushion Arterial status:N/A.  Recent antibiotics:none recent Connective tissue disorder or inflammatory arthropathy:yes Nutrition compromised: no  Past Medical History:  Diagnosis Date  . Anemia   . C7 spinal cord injury (CMS/HHS-HCC)   . Colostomy present (CMS/HHS-HCC)   . Constipation   . Dyspepsia   . Hyperlipidemia   . Leukopenia   . Neurogenic bladder   . Quadriplegia (CMS/HHS-HCC)   . Vitamin D deficiency    No Known Allergies Current Outpatient Medications  Medication Sig Dispense Refill  . acetic acid 0.25 % irrigation Irrigate with 50 mLs as  directed once daily Irrigate wound    . ASPERCREME 10 % cream     . b complex vitamins capsule     . baclofen (LIORESAL) 5 mg tablet Take 5 mg by mouth 3 (three) times daily    . bisacodyL (DULCOLAX) 5 mg EC tablet Take 5 mg by mouth once daily as needed for Constipation    . calcium carbonate (TUMS) 200 mg calcium (500 mg) chewable tablet Take 2 tablets by mouth once daily    . calcium carbonate-vitamin D3 (CALTRATE 600+D) 600 mg-10 mcg (400 unit) tablet     . carboxymethylcellulose-glycerin (REFRESH OPTIVE) 0.5-0.9 % ophthalmic solution Place 1-2 drops into both eyes as needed for Dry Eyes    . DAKIN'S SOLUTION 0.125 % Soln solution     . diclofenac (VOLTAREN) 1 % topical gel Apply 2 g topically 4 (four) times daily Foot and shoulder    . ENSURE ENLIVE 0.08 gram-1.5 kcal/mL Liqd     . ergocalciferol, vitamin D2, 1,250 mcg (50,000 unit) capsule Take 1,000 mcg by mouth every morning    . FLINTSTONES COMPLETE Chew     . gabapentin (NEURONTIN) 100 MG capsule Take 300 mg by mouth    . gabapentin (NEURONTIN) 300 MG capsule Take 2 capsules (600 mg total) by mouth at bedtime    . HEALTHYLAX packet     . IODOSORB 0.9 % gel     . lactulose (ENULOSE) 10 gram/15 mL oral solution     . lactulose (ENULOSE) 20 gram/30 mL solution Take 15 mLs by mouth once  daily as needed    . leuprolide acetate, 6 month, (ELIGARD, 6 MONTH,) 45 mg injection Inject 45 mg subcutaneously every 6 (six) months    . methenamine hippurate (HIPREX) 1 gram tablet     . midodrine  (PROAMATINE ) 2.5 MG tablet Take 2.5 mg by mouth 3 (three) times daily    . naproxen sodium 220 mg Cap Take 220 mg by mouth at bedtime as needed    . pediatric multivitamin no.49 (FLINTSTONES GUMMIES ORAL) Take 2 tablets by mouth once daily    . potassium chloride (KLOR-CON) 10 MEQ ER tablet Take 10 mEq by mouth once daily    . PRENATAL VITAMIN PLUS LOW IRON tablet     . SANTYL ointment     . sennosides (SENOKOT) 8.6 mg tablet Take 1 tablet by mouth once  daily    . sennosides-docusate (SENOKOT-S) 8.6-50 mg tablet Take 1 tablet by mouth once daily    . simethicone (MYLICON) 125 MG chewable tablet     . simethicone 125 mg Tab Take 1 mg by mouth 4 (four) times daily as needed    . sodium chlor/hypochlorous acid (VASHE IRRIG) Irrigate with 0.033 % as directed    . VASHE 0.033 % irrigation solution     . VITAMIN B COMPLEX ORAL Take 1 capsule by mouth 2 (two) times daily     No current facility-administered medications for this visit.   Patient Active Problem List  Diagnosis  . Quadriplegia, C5-C7, incomplete (CMS/HHS-HCC)  . SIRS (systemic inflammatory response syndrome) (CMS/HHS-HCC)  . Sacral decubitus ulcer  . Skin ulcer of upper arm  . Neurogenic bladder  . Hypotension  . Constipation, chronic  . Bursitis disorder  . Retention of urine   Past Surgical History:  Procedure Laterality Date  . COLONOSCOPY    . COLOSTOMY      reports that he has quit smoking. His smoking use included cigarettes. He started smoking about 41 years ago. He has never been exposed to tobacco smoke. He does not have any smokeless tobacco history on file. He reports that he does not currently use alcohol. Family History  Problem Relation Name Age of Onset  . Cancer Mother    . Cancer Father      Review of Systems   Pain report: denies pain General ROS: negative Wound changes: no concerns Reports of Swelling: none  I have reviewed the ROS that was collected by the clinic nurse.   Physical Exam    BP 100/72   Pulse 73   Temp 36.5 C (97.7 F)   Resp 18  Constitutional: Alert, no acute distress  HEENT: Not examined   Neck:  Not examined  Lungs: unlabored breathing   Cardiac: Not examined  Abdomen:  Not examined  Neurologic: normal tone and strength  Back: not examined  Integument: Overall clean, dry and intact  Ext: Palpable pulses    Wound Assessment Pre-debridement: Character/Location: Stage 4 PI/sacral region  Bed: granulation tissue  no obvious impediments to healing Edges: Not attached Exudate: moderate, serosanguinous    Infection: no signs or symptoms of infection Peri wound: Normal    Data   Lab Results  Component Value Date   WBC 8.1 12/03/2023   HGB 11.3 (L) 12/03/2023   HCT 37.2 (L) 12/03/2023   PLT 356 12/03/2023   Lab Results  Component Value Date   NA 137 12/03/2023   K 4.3 12/03/2023   CL 105 12/03/2023   CO2 20 (L) 12/03/2023   BUN  25 (H) 12/03/2023   CREATININE 0.5 (L) 12/03/2023   GLUCOSE 92 12/03/2023   No results found for: APTT, INR No results found for: COLORU, CLARITYU, SPECGRAV, PHUR, PROTEINUR, GLUCOSEU, KETONESU, BLOODU, NITRITE, LEUKOCYTESUR, BILIRUBINUR, UROBILINOGEN, RBCUA, WBCUA, SQUAMEPI, CASTUA, BACTERIA, UACOMMENT  Procedure Note   NP WOUND PROGRESS/ PROCEDURE NOTE  Chief Complaint:  Chief Complaint  Patient presents with  . Wound Check    []  No procedure performed [x]  Refer to the Nursing Wound Assessment for additional wound details  Pre-Procedure Diagnosis:  Decubitus ulcer of sacral region, stage 4 (CMS-HCC)  (primary encounter diagnosis)  Localized pain Plan: lidocaine  (XYLOCAINE ) 4 % (40 mg/mL) external        solution 15-120 mL   Anesthetic:  []  None  [] 2% Xylocaine  Gel  [x] 4% Topical Xylocaine   [] 5% Topical Xylocaine  [] Other                     Procedure Tolerated:  Yes (See narrative) Did Anyone Assist MD with Procedure: No  Complications:   no (See narrative) Patient Instructions Given:  yes Next Visit:Return in about 3 months (around 06/25/2024) for NP visit.  Under sterile precautions, using 4% lidocaine , a combination of Rongeurs, pick-ups, knives, and instruments, the wound was debrided into and through skin and subcutaneous tissue.   All unhealthy and fibrous tissue was removed.  Good granulation tissue was obtained.  Bleeding was controlled with local pressure.  Patient tolerated the procedure well.     I personally performed or jointly provided the services with an auxiliary provider (RN, pensions consultant, etc.) but there was no involvement of a resident or fellow.  Ronal Josette Able, NP   CLINICAL PROCEDURE NOTE     Pressure Injury (Ulcer) 05/21/23 Stage 4: Full thickness tissue loss Sacrum (Active)  Wound Image   03/25/24 1013  Wound Assessment Red 03/25/24 1013  Wound Edges irregular 03/25/24 1013  Peri-wound Assessment Pink intact scar tissue 03/25/24 1013  Wound Length (cm) 4 cm 03/25/24 1013  Wound Width (cm) 0.6 cm 03/25/24 1013  Wound Depth (cm) 3.9 cm 03/25/24 1013  Wound Surface Area (cm^2) 1.88 cm^2 03/25/24 1013  Wound Volume (cm^3) 4.901 cm^3 03/25/24 1013  Drainage Amount Large 03/25/24 1013  Drainage Description Serosanguineous 03/25/24 1013  Dressing Type Packing;Wet to dry 03/25/24 1013  Dressing Last Changed 03/24/24 03/25/24 1013  Premedication Time 15 minutes prior 03/25/24 1013   No debridement  Plan:   [facility]:  Phone: [phone]  Fax: [phone]  Ronal Cypress, MD   Site: Sacrum  1. Cleanse wound(s) with warm,soapy water  (may substitute with wound cleanser and 4x4 gauze). Rinse. Pat dry.  2. Pack Anasept spray or vashe moist roll gauze into wound bed.  3. Cover with ABD .  4. Secure with medipore tape.  5. Skin prep peri-wound.  Frequency:  Change twice daily .   F/u 3 months   Assessment/Plan:     ICD-10-CM  1. Decubitus ulcer of sacral region, stage 4 (CMS-HCC)  L89.154  2. Localized pain  R52    Wound care:  Wound tissue is healthy.  No exposed muscle or bone, no necrotic tissue.  Wound care is maintenance currently until bony debridement/flap is performed.  Continue with current wound care orders which help keep bacteria burden down. Continue offloading buttocks and nutritional supplements.   Adjunct therapy: maximize off-loading  HBO treatment: not a candidate at this time   Speciality Bed: yes   Antibiotics/prescriptions:  no  abx therapy indicated  Nutrition: enhance nutrition, recommended high protein diet and protein supplements, tight glycemic control. Continue to monitor Prealbumin and HgA1c.  Labs:  none  Studies:  none Referrals: None needed at this time.  Follow-up:  3 months, sooner if with deterioration or concerns   I personally spent a total of 30 min in both face-to-face and non-face-to-face activities including review of their documents, tests and studies, with over half in discussion of the diagnosis and importance of compliance with the treatment plan on the date of this visit.  Plan of care discussed and agreed upon with patient. I personally performed the service, non-incident to.  (WP)  Roxie Able, BSN, MSN, AGPCNP-BC, CWCN-AP [facility]

## 2024-05-10 ENCOUNTER — Other Ambulatory Visit: Payer: Self-pay

## 2024-05-10 ENCOUNTER — Emergency Department: Admission: EM | Admit: 2024-05-10 | Discharge: 2024-05-10 | Disposition: A | Discharge status: Home / Self Care

## 2024-05-10 ENCOUNTER — Emergency Department

## 2024-05-10 DIAGNOSIS — R4182 Altered mental status, unspecified: Secondary | ICD-10-CM | POA: Diagnosis present

## 2024-05-10 DIAGNOSIS — R464 Slowness and poor responsiveness: Secondary | ICD-10-CM | POA: Diagnosis not present

## 2024-05-10 DIAGNOSIS — R404 Transient alteration of awareness: Secondary | ICD-10-CM

## 2024-05-10 LAB — CBC WITH DIFFERENTIAL/PLATELET
Abs Immature Granulocytes: 0.02 K/uL (ref 0.00–0.07)
Basophils Absolute: 0 K/uL (ref 0.0–0.1)
Basophils Relative: 1 %
Eosinophils Absolute: 0.2 K/uL (ref 0.0–0.5)
Eosinophils Relative: 3 %
HCT: 39.5 % (ref 39.0–52.0)
Hemoglobin: 12.5 g/dL — ABNORMAL LOW (ref 13.0–17.0)
Immature Granulocytes: 0 %
Lymphocytes Relative: 23 %
Lymphs Abs: 1.1 K/uL (ref 0.7–4.0)
MCH: 26.9 pg (ref 26.0–34.0)
MCHC: 31.6 g/dL (ref 30.0–36.0)
MCV: 85.1 fL (ref 80.0–100.0)
Monocytes Absolute: 0.4 K/uL (ref 0.1–1.0)
Monocytes Relative: 8 %
Neutro Abs: 3.1 K/uL (ref 1.7–7.7)
Neutrophils Relative %: 65 %
Platelets: 230 K/uL (ref 150–400)
RBC: 4.64 MIL/uL (ref 4.22–5.81)
RDW: 14.3 % (ref 11.5–15.5)
WBC: 4.8 K/uL (ref 4.0–10.5)
nRBC: 0 % (ref 0.0–0.2)

## 2024-05-10 LAB — COMPREHENSIVE METABOLIC PANEL WITH GFR
ALT: 24 U/L (ref 0–44)
AST: 17 U/L (ref 15–41)
Albumin: 4.1 g/dL (ref 3.5–5.0)
Alkaline Phosphatase: 138 U/L — ABNORMAL HIGH (ref 38–126)
Anion gap: 10 (ref 5–15)
BUN: 23 mg/dL (ref 8–23)
CO2: 24 mmol/L (ref 22–32)
Calcium: 9.3 mg/dL (ref 8.9–10.3)
Chloride: 104 mmol/L (ref 98–111)
Creatinine, Ser: 0.51 mg/dL — ABNORMAL LOW (ref 0.61–1.24)
GFR, Estimated: >60 mL/min (ref >60)
Glucose, Bld: 94 mg/dL (ref 70–99)
Potassium: 3.9 mmol/L (ref 3.5–5.1)
Sodium: 138 mmol/L (ref 135–145)
Total Bilirubin: 0.3 mg/dL (ref 0.0–1.2)
Total Protein: 7.5 g/dL (ref 6.5–8.1)

## 2024-05-10 LAB — PROCALCITONIN: Procalcitonin: 0.18 ng/mL

## 2024-05-10 LAB — LACTIC ACID, PLASMA: Lactic Acid, Venous: 0.8 mmol/L (ref 0.5–1.9)

## 2024-05-10 LAB — CBG MONITORING, ED: Glucose-Capillary: 89 mg/dL (ref 70–99)

## 2024-05-10 MED ORDER — MIDODRINE HCL 5 MG PO TABS
7.5000 mg | ORAL_TABLET | Freq: Once | ORAL | Status: AC
  Administered 2024-05-10: 7.5 mg via ORAL
  Filled 2024-05-10: qty 2

## 2024-05-10 MED ORDER — MIDODRINE HCL 5 MG PO TABS
2.5000 mg | ORAL_TABLET | Freq: Once | ORAL | Status: AC
  Administered 2024-05-10: 2.5 mg via ORAL
  Filled 2024-05-10: qty 1

## 2024-05-10 MED ORDER — MIDODRINE HCL 5 MG PO TABS
10.0000 mg | ORAL_TABLET | Freq: Once | ORAL | Status: DC
  Filled 2024-05-10: qty 2

## 2024-05-10 NOTE — ED Notes (Signed)
 Pt family bedside. Pt taken to CT

## 2024-05-10 NOTE — ED Notes (Signed)
 Family came to desk to ask for an update on transport. Called [facility] for an ETA on transport. Dispatcher states they are enroute to [place] and should be here in the next hour. Family updated and scoffed at this RN.

## 2024-05-10 NOTE — ED Notes (Signed)
 Discharge paperwork and medical necessity given to Secretary to be given to [facility]

## 2024-05-10 NOTE — ED Provider Notes (Signed)
 [facility] Provider Note    None    (approximate)   History   Loss of Consciousness and Altered Mental Status   HPI  [name] is a 68 y.o. 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  Patient states that he was watching TV and did not want to be bothered.  Patient's sister called EMS when she states that patient closes eyes and would not wake/respond to her when she asked.  Patient tells me that he remembers this entire incident and did not want to respond or talk however patient's sister states that she cannot be sure this is true.  Upon EMS arrival patient did not want to talk to them as well and the patient was made a code stroke for aphasia however in which he voiced to the EMS that he could talk all the time but just did not want to go.  Unfortunately they were unable to cancel the stroke alert and route.  On my assessment patient states he has no complaints he states that he remembers the entire morning and does not want to be here but is amenable to some blood tests and imaging.  He tells me that the Foley was last changed 1 week ago.  He denies any fevers or chills, cough congestion, headache, hearing or vision changes or new focal neurological deficits.      Physical Exam   Triage Vital Signs: ED Triage Vitals [05/10/24 1624]  Encounter Vitals Group     BP      Girls Systolic BP Percentile      Girls Diastolic BP Percentile      Boys Systolic BP Percentile      Boys Diastolic BP Percentile      Pulse      Resp      Temp      Temp src      SpO2 96 %     Weight      Height      Head Circumference      Peak Flow      Pain Score      Pain Loc       Pain Education      Exclude from Growth Chart     Most recent vital signs: Vitals:   05/10/24 1800 05/10/24 1830  BP: (!) 129/93 126/89  Pulse:    Resp: 15 18  Temp:    SpO2:      Nursing Triage Note reviewed. Vital signs reviewed and patients oxygen saturation is normoxic  General: Patient is well nourished, well developed, awake and alert, resting comfortably in no acute distress Head: Normocephalic and atraumatic Eyes: Normal inspection, extraocular muscles intact, no conjunctival pallor Ear, nose, throat: Normal external exam Neck: Normal range of motion Respiratory: Patient is in no respiratory distress, lungs CTAB Cardiovascular: Patient is not tachycardic, RRR without murmur appreciated GI: Abd SNT with no guarding or rebound  Ostomy in place draining stool contents Back: Normal inspection of the back with good strength and range of motion throughout all ext\  Extremities: pulses intact with good cap refills, no LE pitting edema or calf tenderness Neuro: The patient is alert and oriented to person, place, and time, cranial nerves  V and VII intact bilaterally, no new focal deficits Psych: Flat mood and affect, no SI or HI  ED Results / Procedures / Treatments   Labs (all labs ordered are listed, but only abnormal results are displayed) Labs Reviewed  CBC WITH DIFFERENTIAL/PLATELET - Abnormal; Notable for the following components:      Result Value   Hemoglobin 12.5 (*)    All other components within normal limits  COMPREHENSIVE METABOLIC PANEL WITH GFR - Abnormal; Notable for the following components:   Creatinine, Ser 0.51 (*)    Alkaline Phosphatase 138 (*)    All other components within normal limits  PROCALCITONIN  LACTIC ACID, PLASMA  CBG MONITORING, ED     EKG EKG and rhythm strip are interpreted by myself:   EKG: [Normal sinus rhythm] at heart rate of 68, normal QRS duration, QTc 465, normal ST segments and T waves no ectopy EKG not consistent with  Acute STEMI Rhythm strip: NSR in lead II   RADIOLOGY CT head: No acute abnormality on my independent review interpretation radiologist agrees    PROCEDURES:  Critical Care performed: No  Procedures   MEDICATIONS ORDERED IN ED: Medications  midodrine  (PROAMATINE ) tablet 10 mg (10 mg Oral Patient Refused/Not Given 05/10/24 2137)  midodrine  (PROAMATINE ) tablet 2.5 mg (2.5 mg Oral Given 05/10/24 1732)  midodrine  (PROAMATINE ) tablet 7.5 mg (7.5 mg Oral Given 05/10/24 1740)     IMPRESSION / MDM / ASSESSMENT AND PLAN / ED COURSE                                Differential diagnosis includes, but is not limited to, bizarre behavior, syncope, arrhythmia, electrolyte derangement anemia, TIA  ED course: Patient presents acutely but has no new focal neurological deficits on my presentation and given the report that this was likely behavioral stroke alert was canceled.  Patient had no profound electrolyte derangements no leukocytosis or no profound anemia.  Lactic acid was not elevated and never was his procalcitonin.  CT head demonstrated no intracranial hemorrhage and EKG had no arrhythmia or acute ischemia.  I counseled the patient and his sister that at this time is unclear to me whether the patient's symptoms are secondary to behavior versus a syncopal episode however patient does not want to be in the emergency department any longer and all in agreement to return home.  I have given him his home dose of midodrine  and we are currently awaiting transportation back home via [facility]   Clinical Course as of 05/10/24 2210  Sun May 10, 2024  1721 Sister at bedside and patient not mental status.  It is unclear to me what happened today however dog does not want to stay in our facility any longer and I do think he has the capacity to make this decision.  I counseled him and his sister that I cannot rule out a TIA or syncopal episode they voiced understanding but [name] states that he does want to  return home and continue watching the football game and he did this episode because he did not want to talk to anyone.  All questions answered and patient and sister voiced understanding and requested discharge [HD]    Clinical Course User Index [HD] Nicholaus Rolland BRAVO, MD   -- Risk: 5 This patient has a high risk of morbidity due to further diagnostic testing or treatment. Rationale: This patient's evaluation and management involve a high risk of  morbidity due to the potential severity of presenting symptoms, need for diagnostic testing, and/or initiation of treatment that may require close monitoring. The differential includes conditions with potential for significant deterioration or requiring escalation of care. Treatment decisions in the ED, including medication administration, procedural interventions, or disposition planning, reflect this level of risk. COPA: 5 The patient has the following acute or chronic illness/injury that poses a possible threat to life or bodily function: [X] : The patient has a potentially serious acute condition or an acute exacerbation of a chronic illness requiring urgent evaluation and management in the Emergency Department. The clinical presentation necessitates immediate consideration of life-threatening or function-threatening diagnoses, even if they are ultimately ruled out.   FINAL CLINICAL IMPRESSION(S) / ED DIAGNOSES   Final diagnoses:  Episode of unresponsiveness     Rx / DC Orders   ED Discharge Orders     None        Note:  This document was prepared using Dragon voice recognition software and may include unintentional dictation errors.   Nicholaus Rolland BRAVO, MD 05/10/24 [phone]

## 2024-05-10 NOTE — Discharge Instructions (Addendum)
 You were seen in the emergency department for an episode of reported decreased responsiveness however you stated that you remember the whole thing.  Unfortunately I cannot rule out that this was not an episode of syncope albeit workup today is reassuring.  You declined further observation and expressed her desire to return home which I think is reasonable.  Please continue your regular medications.  Please follow-up with your primary care physician next week.  Return with any acutely worsening symptoms or any other emergency. -- RETURN PRECAUTIONS & AFTERCARE: (ENGLISH) RETURN PRECAUTIONS: Return immediately to the emergency department or see/call your doctor if you feel worse, weak or have changes in speech or vision, are short of breath, have fever, vomiting, pain, bleeding or dark stool, trouble urinating or any new issues. Return here or see/call your doctor if not improving as expected for your suspected condition. FOLLOW-UP CARE: Call your doctor and/or any doctors we referred you to for more advice and to make an appointment. Do this today, tomorrow or after the weekend. Some doctors only take PPO insurance so if you have HMO insurance you may want to contact your HMO or your regular doctor for referral to a specialist within your plan. Either way tell the doctor's office that it was a referral from the emergency department so you get the soonest possible appointment.  YOUR TEST RESULTS: Take result reports of any blood or urine tests, imaging tests and EKG's to your doctor and any referral doctor. Have any abnormal tests repeated. Your doctor or a referral doctor can let you know when this should be done. Also make sure your doctor contacts this hospital to get any test results that are not currently available such as cultures or special tests for infection and final imaging reports, which are often not available at the time you leave the ER but which may list additional important findings that are not  documented on the preliminary report. BLOOD PRESSURE: If your blood pressure was greater than 120/80 have your blood pressure rechecked within 1 to 2 weeks. MEDICATION SIDE EFFECTS: Do not drive, walk, bike, take the bus, etc. if you have received or are being prescribed any sedating medications such as those for pain or anxiety or certain antihistamines like Benadryl. If you have been give one of these here get a taxi home or have a friend drive you home. Ask your pharmacist to counsel you on potential side effects of any new medication

## 2024-05-10 NOTE — Progress Notes (Signed)
  Chaplain On-Call responded to Code Stroke notification at 1604 hours.  The patient was with the Care Team.  Chaplain assured Staff of availability as needed.  The Code Stroke was cancelled at 1625.  Chaplain Bebe Ardean EMERSON Hershal., [facility]

## 2024-05-10 NOTE — ED Notes (Signed)
 Went to update patient family after [facility] transport. No family bedside.

## 2024-05-10 NOTE — ED Notes (Signed)
 Per [facility] patient is 3 on the list called at 6:28

## 2024-05-10 NOTE — ED Notes (Signed)
 Called [facility] spoke to [place] to cancel code stroke per Dr. Rolland Moats [phone]

## 2024-05-10 NOTE — ED Triage Notes (Signed)
 Per EMS  AMS/slurred speech Started around 1510 today Pt spontaneously stated he just didn't want to talk to EMS Pt able to move arms but not follow

## 2024-05-11 ENCOUNTER — Other Ambulatory Visit: Payer: Self-pay | Admitting: Family Medicine

## 2024-05-11 DIAGNOSIS — R4182 Altered mental status, unspecified: Secondary | ICD-10-CM

## 2024-05-13 ENCOUNTER — Ambulatory Visit
Admission: RE | Admit: 2024-05-13 | Discharge: 2024-05-13 | Disposition: A | Discharge status: Home / Self Care | Source: Ambulatory Visit | Attending: Family Medicine | Admitting: Family Medicine

## 2024-05-13 DIAGNOSIS — R4182 Altered mental status, unspecified: Secondary | ICD-10-CM | POA: Insufficient documentation

## 2024-05-13 MED ORDER — GADOBUTROL 1 MMOL/ML IV SOLN
7.0000 mL | Freq: Once | INTRAVENOUS | Status: AC | PRN
  Administered 2024-05-13: 7 mL via INTRAVENOUS

## 2024-05-19 ENCOUNTER — Other Ambulatory Visit: Payer: Self-pay | Admitting: Family Medicine

## 2024-05-19 DIAGNOSIS — I639 Cerebral infarction, unspecified: Secondary | ICD-10-CM

## 2024-05-20 ENCOUNTER — Encounter (HOSPITAL_COMMUNITY): Payer: Self-pay | Admitting: Family Medicine

## 2024-05-20 DIAGNOSIS — I4891 Unspecified atrial fibrillation: Secondary | ICD-10-CM

## 2024-05-28 ENCOUNTER — Other Ambulatory Visit: Payer: Self-pay | Admitting: Family Medicine

## 2024-05-28 ENCOUNTER — Telehealth: Payer: Self-pay

## 2024-05-28 DIAGNOSIS — I4819 Other persistent atrial fibrillation: Secondary | ICD-10-CM

## 2024-05-28 DIAGNOSIS — I4892 Unspecified atrial flutter: Secondary | ICD-10-CM

## 2024-05-28 NOTE — Telephone Encounter (Signed)
 Fax received on 05/26/24 from [facility]. Order was printed and given to Doyal, echo ready to schedule.

## 2024-06-01 ENCOUNTER — Ambulatory Visit: Admission: RE | Admit: 2024-06-01 | Discharge: 2024-06-01 | Attending: Family Medicine | Admitting: Family Medicine

## 2024-06-01 DIAGNOSIS — I639 Cerebral infarction, unspecified: Secondary | ICD-10-CM | POA: Diagnosis present

## 2024-06-05 NOTE — Unmapped External Note (Signed)
 [facility]OLOG[facility] - Operative Note   VIR Post-Procedure Note  Procedure Name: suprapubic catheter exchange and upsize  Pre-Op Diagnosis: neurogenic bladder; first routine exchange  Post-Op Diagnosis: Same as pre-operative diagnosis  VIR Providers  Resident/Fellow/APP: Charmaine Provencal, PA-C  Description of procedure: Successful exchange of a 12-Fr pigtail suprapubic catheter for a 16-Fr Council suprapubic catheter using serial dilation. Return in 6 weeks for next routine exchange with VIR. Subsequent exchanges can then be done with urology.   Sedation:Moderate sedation  Estimated Blood Loss: approximately <3 mL Complications: None  See detailed procedure note with images in PACS [facility]).  The patient tolerated the procedure well without incident or complication and left the room in stable condition.  Charmaine Provencal, PA-C 06/05/2024 2:09 PM

## 2024-06-05 NOTE — H&P (Signed)
 [facility] INTERVENTIONAL RADIOLOGY - Pre Procedure H/P Patient name: [name] CSN: [id number] MRN: [id number] Date of Procedure: 06/05/24   Assessment/Plan:   [name] is a 68 y.o. male who will undergo suprapubic catheter exchange with moderate sedation in Interventional Radiology.  Consent obtained in the Pre Op holding area by Charmaine Provencal, PA-C.  Risks, benefits, and alternatives including but not limited to bleeding, infection, damage to adjacent structures were discussed with patient/patient's representative. All questions were answered to patient/patient's representative satisfaction.  Patient/Patient's representative consents and would like to proceed with the procedure.  --The patient will accept blood products in an emergent situation. --The patient does not have a Do Not Resuscitate order in effect.   HPI: [name] is a 68 y.o. male with history of C-spine injury c/b neurogenic bladder, neurogenic bladder, and atrial fibrillation who presents for [facility] exchange and upsize. Patient has a 12-Fr pigtail skater suprapubic catheter that was placed by [facility] on 05/27/24. This has since clogged and patient had leaking via urethra so currently also has a condom cath in place.   Past Medical History[1]  Past Surgical History[2]   Allergies: Allergies[3]  Medications:  Eliquis - no need to hold  ASA Grade: ASA 3 - Patient with moderate systemic disease with functional limitations  PE:   Vitals:   06/05/24 1142  BP: 127/93  Pulse: 58  Temp: 36.3 C (97.3 F)  SpO2: 99%   General: male in NAD. Airway assessment: Class 3 - Can visualize soft palate Lungs: Respirations nonlabored         [1] Past Medical History: Diagnosis Date   Paraplegia (CMS-HCC)    Skin ulcer of upper arm, left 11/23/2013  [2] Past Surgical History: Procedure Laterality Date   PR EXC SACR PRESS ULC BONE & SUTURE Midline 06/10/2013   Procedure: EXC SACRAL PRESS ULCER W/PRIM SUTURE; W/OSTECTMY;   Surgeon: Michael KATHEE Peal, MD;  Location: MAIN OR [facility];  Service: Trauma   PR LAP, SURG COLOSTOMY N/A 06/13/2013   Procedure: LAPAROSCOPY, SURGICAL, COLOSTOMY OR SKIN LEVEL CECOSTOMY;  Surgeon: Michael KATHEE Peal, MD;  Location: MAIN OR [facility];  Service: Trauma  [3] No Known Allergies

## 2024-06-05 NOTE — Progress Notes (Signed)
 Suprapubic catheter dressing clean, dry, and intact, urine slightly pink tinged and draining. Condom catheter remains in place per family request. Patient hypotensive in recovery but asymptomatic, BP improved with time and PO hydration.  Discharge instructions reviewed and given to pt and sister.  Understanding verbalized.  PIV removed with tip intact.  Home supplies provided.  Pt assisted back to home wheelchair via lift.  Discharged from PRU in stable condition.

## 2024-07-03 ENCOUNTER — Ambulatory Visit: Attending: Family Medicine

## 2024-07-03 DIAGNOSIS — I4891 Unspecified atrial fibrillation: Secondary | ICD-10-CM

## 2024-07-03 DIAGNOSIS — I4892 Unspecified atrial flutter: Secondary | ICD-10-CM

## 2024-07-03 DIAGNOSIS — I4819 Other persistent atrial fibrillation: Secondary | ICD-10-CM | POA: Diagnosis not present

## 2024-07-08 LAB — ECHOCARDIOGRAM COMPLETE
AR max vel: 1.69 cm2
AV Area VTI: 1.5 cm2
AV Area mean vel: 1.52 cm2
AV Mean grad: 1.6 mmHg
AV Peak grad: 2.4 mmHg
Ao pk vel: 0.77 m/s
Area-P 1/2: 3.37 cm2
S' Lateral: 2.4 cm

## 2024-10-21 ENCOUNTER — Ambulatory Visit: Payer: Medicare (Managed Care) | Admitting: Radiation Oncology
